# Patient Record
Sex: Male | Born: 1960 | Race: Black or African American | Hispanic: No | Smoking: Former smoker
Health system: Southern US, Community
[De-identification: ages and names within clinical notes are randomized; demographics above are authoritative.]

## PROBLEM LIST (undated history)

## (undated) DIAGNOSIS — E119 Type 2 diabetes mellitus without complications: Secondary | ICD-10-CM

## (undated) DIAGNOSIS — R972 Elevated prostate specific antigen [PSA]: Secondary | ICD-10-CM

## (undated) DIAGNOSIS — K7460 Unspecified cirrhosis of liver: Secondary | ICD-10-CM

## (undated) DIAGNOSIS — F1010 Alcohol abuse, uncomplicated: Secondary | ICD-10-CM

## (undated) HISTORY — PX: OTHER SURGICAL HISTORY: SHX169

## (undated) HISTORY — PX: MOUTH SURGERY: SHX715

## (undated) HISTORY — DX: Type 2 diabetes mellitus without complications: E11.9

## (undated) HISTORY — PX: TONSILLECTOMY: SUR1361

## (undated) HISTORY — PX: EYE SURGERY: SHX253

---

## 1998-08-21 ENCOUNTER — Encounter: Payer: Self-pay | Admitting: Emergency Medicine

## 1998-08-21 ENCOUNTER — Emergency Department (HOSPITAL_COMMUNITY): Admission: EM | Admit: 1998-08-21 | Discharge: 1998-08-21 | Payer: Self-pay | Admitting: Emergency Medicine

## 1998-09-14 ENCOUNTER — Emergency Department (HOSPITAL_COMMUNITY): Admission: EM | Admit: 1998-09-14 | Discharge: 1998-09-14 | Payer: Self-pay | Admitting: Emergency Medicine

## 2001-08-29 ENCOUNTER — Emergency Department (HOSPITAL_COMMUNITY): Admission: EM | Admit: 2001-08-29 | Discharge: 2001-08-29 | Payer: Self-pay | Admitting: Emergency Medicine

## 2001-08-29 ENCOUNTER — Encounter: Payer: Self-pay | Admitting: Emergency Medicine

## 2005-02-03 ENCOUNTER — Emergency Department (HOSPITAL_COMMUNITY): Admission: EM | Admit: 2005-02-03 | Discharge: 2005-02-03 | Payer: Self-pay | Admitting: Emergency Medicine

## 2005-04-06 ENCOUNTER — Emergency Department (HOSPITAL_COMMUNITY): Admission: EM | Admit: 2005-04-06 | Discharge: 2005-04-06 | Payer: Self-pay | Admitting: Emergency Medicine

## 2005-12-08 ENCOUNTER — Emergency Department (HOSPITAL_COMMUNITY): Admission: EM | Admit: 2005-12-08 | Discharge: 2005-12-08 | Payer: Self-pay | Admitting: Emergency Medicine

## 2006-01-09 ENCOUNTER — Ambulatory Visit: Payer: Self-pay | Admitting: Gastroenterology

## 2006-01-10 ENCOUNTER — Ambulatory Visit: Payer: Self-pay | Admitting: Gastroenterology

## 2006-01-10 ENCOUNTER — Other Ambulatory Visit: Admission: RE | Admit: 2006-01-10 | Discharge: 2006-01-10 | Payer: Self-pay | Admitting: Gastroenterology

## 2006-01-16 ENCOUNTER — Ambulatory Visit: Payer: Self-pay | Admitting: Internal Medicine

## 2006-01-30 ENCOUNTER — Ambulatory Visit: Payer: Self-pay | Admitting: Gastroenterology

## 2006-05-02 ENCOUNTER — Ambulatory Visit: Payer: Self-pay | Admitting: Gastroenterology

## 2006-05-15 ENCOUNTER — Emergency Department (HOSPITAL_COMMUNITY): Admission: EM | Admit: 2006-05-15 | Discharge: 2006-05-15 | Payer: Self-pay | Admitting: Emergency Medicine

## 2006-05-26 ENCOUNTER — Ambulatory Visit: Payer: Self-pay | Admitting: Gastroenterology

## 2012-08-21 ENCOUNTER — Encounter (HOSPITAL_COMMUNITY): Payer: Self-pay | Admitting: *Deleted

## 2012-08-21 ENCOUNTER — Encounter (HOSPITAL_COMMUNITY): Payer: Self-pay | Admitting: Emergency Medicine

## 2012-08-21 ENCOUNTER — Emergency Department (HOSPITAL_COMMUNITY): Payer: Medicaid Other

## 2012-08-21 ENCOUNTER — Emergency Department (INDEPENDENT_AMBULATORY_CARE_PROVIDER_SITE_OTHER)
Admission: EM | Admit: 2012-08-21 | Discharge: 2012-08-21 | Disposition: A | Discharge status: Nursing Home (Includes ICF) | Payer: Self-pay | Source: Home / Self Care | Attending: Family Medicine | Admitting: Family Medicine

## 2012-08-21 ENCOUNTER — Inpatient Hospital Stay (HOSPITAL_COMMUNITY)
Admission: EM | Admit: 2012-08-21 | Discharge: 2012-08-24 | DRG: 441 | Disposition: A | Discharge status: Home / Self Care | Payer: Medicaid Other | Attending: Internal Medicine | Admitting: Internal Medicine

## 2012-08-21 DIAGNOSIS — D649 Anemia, unspecified: Secondary | ICD-10-CM

## 2012-08-21 DIAGNOSIS — Z79899 Other long term (current) drug therapy: Secondary | ICD-10-CM

## 2012-08-21 DIAGNOSIS — N289 Disorder of kidney and ureter, unspecified: Secondary | ICD-10-CM

## 2012-08-21 DIAGNOSIS — K089 Disorder of teeth and supporting structures, unspecified: Secondary | ICD-10-CM | POA: Diagnosis present

## 2012-08-21 DIAGNOSIS — N183 Chronic kidney disease, stage 3 unspecified: Secondary | ICD-10-CM

## 2012-08-21 DIAGNOSIS — Z23 Encounter for immunization: Secondary | ICD-10-CM

## 2012-08-21 DIAGNOSIS — D696 Thrombocytopenia, unspecified: Secondary | ICD-10-CM | POA: Diagnosis present

## 2012-08-21 DIAGNOSIS — D684 Acquired coagulation factor deficiency: Secondary | ICD-10-CM | POA: Diagnosis present

## 2012-08-21 DIAGNOSIS — N390 Urinary tract infection, site not specified: Secondary | ICD-10-CM | POA: Diagnosis present

## 2012-08-21 DIAGNOSIS — R209 Unspecified disturbances of skin sensation: Secondary | ICD-10-CM | POA: Diagnosis present

## 2012-08-21 DIAGNOSIS — N179 Acute kidney failure, unspecified: Secondary | ICD-10-CM | POA: Diagnosis present

## 2012-08-21 DIAGNOSIS — K7200 Acute and subacute hepatic failure without coma: Principal | ICD-10-CM | POA: Diagnosis present

## 2012-08-21 DIAGNOSIS — R188 Other ascites: Secondary | ICD-10-CM | POA: Diagnosis present

## 2012-08-21 DIAGNOSIS — K7030 Alcoholic cirrhosis of liver without ascites: Secondary | ICD-10-CM | POA: Diagnosis present

## 2012-08-21 DIAGNOSIS — K7031 Alcoholic cirrhosis of liver with ascites: Secondary | ICD-10-CM

## 2012-08-21 DIAGNOSIS — K7682 Hepatic encephalopathy: Secondary | ICD-10-CM | POA: Diagnosis present

## 2012-08-21 DIAGNOSIS — F17200 Nicotine dependence, unspecified, uncomplicated: Secondary | ICD-10-CM | POA: Diagnosis present

## 2012-08-21 DIAGNOSIS — G5790 Unspecified mononeuropathy of unspecified lower limb: Secondary | ICD-10-CM | POA: Diagnosis present

## 2012-08-21 DIAGNOSIS — E46 Unspecified protein-calorie malnutrition: Secondary | ICD-10-CM | POA: Diagnosis present

## 2012-08-21 DIAGNOSIS — K7290 Hepatic failure, unspecified without coma: Secondary | ICD-10-CM | POA: Diagnosis present

## 2012-08-21 DIAGNOSIS — D689 Coagulation defect, unspecified: Secondary | ICD-10-CM | POA: Diagnosis present

## 2012-08-21 DIAGNOSIS — F1020 Alcohol dependence, uncomplicated: Secondary | ICD-10-CM | POA: Diagnosis present

## 2012-08-21 DIAGNOSIS — K7689 Other specified diseases of liver: Secondary | ICD-10-CM

## 2012-08-21 HISTORY — DX: Alcohol abuse, uncomplicated: F10.10

## 2012-08-21 LAB — COMPREHENSIVE METABOLIC PANEL
ALT: 24 U/L (ref 0–53)
Albumin: 1.9 g/dL — ABNORMAL LOW (ref 3.5–5.2)
Alkaline Phosphatase: 393 U/L — ABNORMAL HIGH (ref 39–117)
BUN: 11 mg/dL (ref 6–23)
Chloride: 99 mEq/L (ref 96–112)
GFR calc Af Amer: 21 mL/min — ABNORMAL LOW (ref >90)
Glucose, Bld: 142 mg/dL — ABNORMAL HIGH (ref 70–99)
Potassium: 3.6 mEq/L (ref 3.5–5.1)
Sodium: 135 mEq/L (ref 135–145)
Total Bilirubin: 3.9 mg/dL — ABNORMAL HIGH (ref 0.3–1.2)

## 2012-08-21 LAB — POCT I-STAT, CHEM 8
HCT: 32 % — ABNORMAL LOW (ref 39.0–52.0)
Hemoglobin: 10.9 g/dL — ABNORMAL LOW (ref 13.0–17.0)
Potassium: 4.2 mEq/L (ref 3.5–5.1)
Sodium: 140 mEq/L (ref 135–145)

## 2012-08-21 LAB — POCT I-STAT 3, ART BLOOD GAS (G3+)
Acid-base deficit: 3 mmol/L — ABNORMAL HIGH (ref 0.0–2.0)
Bicarbonate: 20.3 mEq/L (ref 20.0–24.0)
O2 Saturation: 90 %
TCO2: 21 mmol/L (ref 0–100)
pCO2 arterial: 29.5 mmHg — ABNORMAL LOW (ref 35.0–45.0)
pO2, Arterial: 56 mmHg — ABNORMAL LOW (ref 80.0–100.0)

## 2012-08-21 LAB — URINE MICROSCOPIC-ADD ON

## 2012-08-21 LAB — URINALYSIS, ROUTINE W REFLEX MICROSCOPIC
Glucose, UA: 100 mg/dL — AB
Ketones, ur: 15 mg/dL — AB
Nitrite: POSITIVE — AB
Specific Gravity, Urine: 1.023 (ref 1.005–1.030)
pH: 5.5 (ref 5.0–8.0)

## 2012-08-21 LAB — CBC WITH DIFFERENTIAL/PLATELET
Hemoglobin: 10.5 g/dL — ABNORMAL LOW (ref 13.0–17.0)
Lymphs Abs: 2 10*3/uL (ref 0.7–4.0)
Monocytes Relative: 9 % (ref 3–12)
Neutro Abs: 9.1 10*3/uL — ABNORMAL HIGH (ref 1.7–7.7)
Neutrophils Relative %: 75 % (ref 43–77)
Platelets: 147 10*3/uL — ABNORMAL LOW (ref 150–400)
RBC: 3.08 MIL/uL — ABNORMAL LOW (ref 4.22–5.81)
WBC: 12.1 10*3/uL — ABNORMAL HIGH (ref 4.0–10.5)

## 2012-08-21 LAB — LIPASE, BLOOD: Lipase: 7 U/L — ABNORMAL LOW (ref 11–59)

## 2012-08-21 LAB — ACETAMINOPHEN LEVEL: Acetaminophen (Tylenol), Serum: <15 ug/mL (ref 10–30)

## 2012-08-21 MED ORDER — SODIUM CHLORIDE 0.9 % IV BOLUS (SEPSIS)
1000.0000 mL | Freq: Once | INTRAVENOUS | Status: AC
  Administered 2012-08-21: 1000 mL via INTRAVENOUS

## 2012-08-21 MED ORDER — VITAMIN K1 10 MG/ML IJ SOLN
10.0000 mg | Freq: Once | INTRAVENOUS | Status: AC
  Administered 2012-08-21: 10 mg via INTRAVENOUS
  Filled 2012-08-21 (×2): qty 1

## 2012-08-21 MED ORDER — INFLUENZA VIRUS VACC SPLIT PF IM SUSP
0.5000 mL | INTRAMUSCULAR | Status: AC
  Administered 2012-08-22: 0.5 mL via INTRAMUSCULAR
  Filled 2012-08-21: qty 0.5

## 2012-08-21 MED ORDER — HYPROMELLOSE (GONIOSCOPIC) 2.5 % OP SOLN: 2.0000 [drp] | Freq: Four times a day (QID) | OPHTHALMIC | Status: DC | PRN

## 2012-08-21 MED ORDER — PNEUMOCOCCAL VAC POLYVALENT 25 MCG/0.5ML IJ INJ
0.5000 mL | INJECTION | INTRAMUSCULAR | Status: AC
  Administered 2012-08-22: 0.5 mL via INTRAMUSCULAR
  Filled 2012-08-21: qty 0.5

## 2012-08-21 MED ORDER — SODIUM CHLORIDE 0.9 % IV SOLN: INTRAVENOUS | Status: DC

## 2012-08-21 MED ORDER — THIAMINE HCL 100 MG/ML IJ SOLN
Freq: Once | INTRAVENOUS | Status: AC
  Administered 2012-08-22: via INTRAVENOUS
  Filled 2012-08-21: qty 1000

## 2012-08-21 NOTE — ED Provider Notes (Signed)
History     CSN: [id number]  Arrival date & time 08/21/12  1057   First MD Initiated Contact with Patient 08/21/12 1715      Chief Complaint  Patient presents with  . Leg Swelling  . Bloated    (Consider location/radiation/quality/duration/timing/severity/associated sxs/prior treatment) HPI  51 y.o. male INAD c/o intermittent bilateral foot parasthesia x2 weeks, Pt also reports increasing abdominal girth x2 days, Urine is darker, denies SOB, last ETOH  x1 month ago, denies h/o heavy use. Weight loss secondary to reduced PO intake fropm tooth pain. Denies fever, chest pain, palpitations, N/V, diarrhea, h/o hepatitis .   History reviewed. No pertinent past medical history.  History reviewed. No pertinent past surgical history.  No family history on file.  History  Substance Use Topics  . Smoking status: Current Every Day Smoker -- 0.5 packs/day    Types: Cigarettes  . Smokeless tobacco: Not on file  . Alcohol Use: Yes     Quit one month ago---never drank heaviy      Review of Systems  Allergies  Review of patient's allergies indicates no known allergies.  Home Medications   Current Outpatient Rx  Name Route Sig Dispense Refill  . HYPROMELLOSE 2.5 % OP SOLN Both Eyes Place 2 drops into both eyes 4 (four) times daily as needed. For dry eyes    . IBUPROFEN 200 MG PO TABS Oral Take 400 mg by mouth every 6 (six) hours as needed. For pain      BP 101/74  Pulse 95  Temp 98.3 F (36.8 C) (Oral)  Resp 14  SpO2 93%  Physical Exam  Nursing note and vitals reviewed. Constitutional: He is oriented to person, place, and time.       Frail and thin.  HENT:  Head: Normocephalic.  Mouth/Throat: Oropharynx is clear and moist.  Eyes: Conjunctivae normal and EOM are normal. Pupils are equal, round, and reactive to light.       Scleral icterus  Neck: Normal range of motion. No JVD present.  Cardiovascular: Normal rate, regular rhythm and intact distal pulses.     Pulmonary/Chest: Effort normal and breath sounds normal. No stridor.  Abdominal: Soft. He exhibits distension. He exhibits no mass. There is no tenderness. There is no rebound and no guarding.       Moderate ascites with no tenseness. Positive fluid wave. Livers edge is nonpalpable  Musculoskeletal: Normal range of motion.       No lower extremity edema.  Neurological: He is alert and oriented to person, place, and time.  Psychiatric: He has a normal mood and affect.    ED Course  Procedures (including critical care time)  Labs Reviewed  CBC WITH DIFFERENTIAL - Abnormal; Notable for the following:    WBC 12.1 (*)     RBC 3.08 (*)     Hemoglobin 10.5 (*)     HCT 29.6 (*)     MCH 34.1 (*)     Platelets 147 (*)     Neutro Abs 9.1 (*)     All other components within normal limits  COMPREHENSIVE METABOLIC PANEL - Abnormal; Notable for the following:    CO2 18 (*)     Glucose, Bld 142 (*)     Creatinine, Ser 3.63 (*)     Calcium 8.3 (*)     Total Protein 8.5 (*)     Albumin 1.9 (*)     AST 86 (*)     Alkaline Phosphatase 393 (*)  Total Bilirubin 3.9 (*)     GFR calc non Af Amer 18 (*)     GFR calc Af Amer 21 (*)     All other components within normal limits  LIPASE, BLOOD - Abnormal; Notable for the following:    Lipase 7 (*)     All other components within normal limits  PROTIME-INR - Abnormal; Notable for the following:    Prothrombin Time 18.8 (*)     INR 1.63 (*)     All other components within normal limits  APTT - Abnormal; Notable for the following:    aPTT 42 (*)     All other components within normal limits  PRO B NATRIURETIC PEPTIDE  URINALYSIS, ROUTINE W REFLEX MICROSCOPIC   Dg Abd Acute W/chest  08/21/2012  *RADIOLOGY REPORT*  Clinical Data: Tightness in abdomen.  Swelling in legs.  History of constipation.  ACUTE ABDOMEN SERIES (ABDOMEN 2 VIEW & CHEST 1 VIEW)  Comparison: 05/15/2006  Findings: Frontal view of the chest demonstrates midline trachea. Normal  heart size and mediastinal contours.  Mild left hemidiaphragm elevation. No pleural effusion or pneumothorax. Diminished lung volumes with mild bibasilar atelectasis.  EKG lead artifact projects over the left upper lobe.  Abominal films demonstrate no free intraperitoneal air on upright positioning.  No significant air fluid levels.  Small bowel loops which measure up to 3.4 cm in the central abdomen.  Mildly dilated.  Paucity of colonic gas.  Minimal distal/rectal gas.  Vascular calcifications.  IMPRESSION:  1.  Nonspecific mildly dilated small bowel loops within the central abdomen.  No significant air fluid levels or evidence of free intraperitoneal air.  Considerations include low grade partial small bowel obstruction or mild adynamic ileus.  Given the centralized bowel loops, ascites would be an alternate concern. Plain film follow up or CT should be considered. 2. No acute cardiopulmonary disease.   Original Report Authenticated By: Consuello Bossier, M.D.      1. Hepatic insufficiency   2. Renal insufficiency       MDM  Liver failure with increased INR, Jaundice, ascites, and high AST (86), alk phos (393). Tachycardic to the 130s and normotensive. I will give the patient vitamin K to see if his INR normalizes. He will be given fluid bolus and attempt to bring his pulse rate down. Further liver function tests and abdominal ultrasound will be ordered.   Creatinine in 3.36 and BUN is normal. ?Hepatorenal syndrome  Ammonia is elevated at 94 and GGT is 419. Patient does not seem to have a truly altered mental status we will defer lactulose administration at this time.  Patient will be admitted to MedSurg floor under care of [facility].        Wynetta Emery, PA-C 08/21/12 2110

## 2012-08-21 NOTE — ED Provider Notes (Signed)
History     CSN: [id number]  Arrival date & time 08/21/12  5621   First MD Initiated Contact with Patient 08/21/12 [id number]      Chief Complaint  Patient presents with  . Foot Pain    (Consider location/radiation/quality/duration/timing/severity/associated sxs/prior treatment) Patient is a 51 y.o. male presenting with neurologic complaint. The history is provided by the patient.  Neurologic Problem The primary symptoms include loss of sensation. The symptoms began more than 1 week ago. The symptoms are unchanged. The neurological symptoms are diffuse.  Additional symptoms do not include weakness or lower back pain. Medical issues also include alcohol use.    History reviewed. No pertinent past medical history.  History reviewed. No pertinent past surgical history.  No family history on file.  History  Substance Use Topics  . Smoking status: Current Every Day Smoker -- 0.5 packs/day    Types: Cigarettes  . Smokeless tobacco: Not on file  . Alcohol Use: Yes     Quit one month ago---never drank heaviy      Review of Systems  Constitutional: Negative.   Gastrointestinal: Negative.   Genitourinary: Negative.   Musculoskeletal: Negative.   Neurological: Positive for numbness. Negative for weakness.    Allergies  Review of patient's allergies indicates no known allergies.  Home Medications   Current Outpatient Rx  Name Route Sig Dispense Refill  . HYPROMELLOSE 2.5 % OP SOLN Both Eyes Place 2 drops into both eyes 4 (four) times daily as needed. For dry eyes    . IBUPROFEN 200 MG PO TABS Oral Take 400 mg by mouth every 6 (six) hours as needed. For pain      BP 117/88  Pulse 129  Temp 98.1 F (36.7 C) (Oral)  Resp 20  SpO2 100%  Physical Exam  Nursing note and vitals reviewed. Constitutional: He is oriented to person, place, and time. He appears well-developed and well-nourished.  Musculoskeletal: He exhibits no edema and no tenderness.  Neurological: He is alert  and oriented to person, place, and time. He has normal strength. A sensory deficit is present.       Decreased foot sensation bilat, pulses nl.  Skin: Skin is warm and dry.       Stasis dermatitis bilat feet, dry, no pain.    ED Course  Procedures (including critical care time)  Labs Reviewed  POCT I-STAT, CHEM 8 - Abnormal; Notable for the following:    Creatinine, Ser 3.70 (*)     Glucose, Bld 134 (*)     Calcium, Ion 0.95 (*)     Hemoglobin 10.9 (*)     HCT 32.0 (*)     All other components within normal limits   No results found.   1. Chronic kidney disease (CKD), stage III (moderate)   2. Anemia       MDM  Creat 3.7, hgb 10.9 glu 134, ca 0.95        Linna Hoff, MD 08/21/12 1253

## 2012-08-21 NOTE — ED Notes (Signed)
Pt c/o bilateral foot swelling x2 weeks... Sx include: rash on both legs, numbness, tingly sensation... He denies: fevers, nausea, vomiting, diarrhea, inj/trauma to feet.... Says it's constant even when resting.

## 2012-08-21 NOTE — Progress Notes (Signed)
51 year old man with abdominal distention of a week's duration.  He denies excess alcohol, hepatitis.  Exam shows his abdomen distended with ascites.  LFT's were abnormal with alk phos 393, albumin 1.9, Bilirubin 3.9, ammonia 94.  Clinical Dx:  Cirrhosis of the liver.  Call to unassigned medicine to admit him.

## 2012-08-21 NOTE — ED Notes (Signed)
PT is here from urgent care with swelling to LE, no shortness of breath with walking or laying down.  Pt is here with abdominal bloating for 2-3 days.  Pt reports passing gas.  .  NO pain

## 2012-08-21 NOTE — ED Notes (Signed)
Asked patient again to give urine sample.  Readjusted in the bed and gave a urinal, asked to use the call light to notify us when he is done.

## 2012-08-21 NOTE — H&P (Signed)
[facility] History and Physical  HENDRY SPEAS WUJ:[id number] DOB: 05/12/1961 DOA: 08/21/2012  Referring physician: ED, Dr. Ignacia Palma PCP: No primary provider on file.   Chief Complaint: Abdominal swelling  HPI: Patrick [name] is a 51 y.o. male who per his own reluctant admission and his wife's admission has not been taking very good care of himself it sounds like.  Specifically he has been using EtOH (consisting of wine) very heavily on a daily basis for many years.  Although he initially denies EtOH abuse in the ED, further history taking from him and his wife reveal that he drinks very heavily, every day, and his wife has been trying to get him to see a doctor for the past 6 years.  Unfortunately he has had a slowly progressive decline that culminated in abdominal distention (not pain) for the past 2 weeks, the patients wife was finally able to convince him to go to the ED.  On eval in the ED he has multiple laboratory abnormalities and a clinical picture that would appear to be cirrhosis of the liver possibly even ESLD, because there is no way as an out patient that he would get in to see a gastroenterologist the ED has asked me to admit him and I have agreed to admit the patient for the purpose of GI consultation regarding ESLD.  Review of Systems: The patient notes he has been through EtOH withdrawal associated with seizures in the past although not recently, denies fever, chills, nausea, vomiting, 12 systems reviewed and otherwise negative.  Past Medical History  Diagnosis Date  . ETOH abuse    History reviewed. No pertinent past surgical history. Social History:  reports that he has been smoking Cigarettes.  He has been smoking about .5 packs per day. He does not have any smokeless tobacco history on file. He reports that he drinks alcohol. He reports that he does not use illicit drugs.   No Known Allergies  No family history on file.  Prior to Admission medications     Medication Sig Start Date End Date Taking? Authorizing Provider  hydroxypropyl methylcellulose (ISOPTO TEARS) 2.5 % ophthalmic solution Place 2 drops into both eyes 4 (four) times daily as needed. For dry eyes   Yes Historical Provider, MD  ibuprofen (ADVIL,MOTRIN) 200 MG tablet Take 400 mg by mouth every 6 (six) hours as needed. For pain   Yes Historical Provider, MD   Physical Exam: Filed Vitals:   08/21/12 1930 08/21/12 1945 08/21/12 2015 08/21/12 2030  BP: 94/72 96/75 92/64  99/70  Pulse: 97 95 92 119  Temp:      TempSrc:      Resp: 18 18 16 21   SpO2: 94% 95% 90% 92%     General:  NAD, resting comfortably in hospital bed  Eyes: marked scleral icterus present, PEERLA EOMI  ENT: There is obvious and severe temporal muscle wasting present, moist mucous membranes  Neck: supple, there is obvious neck muscle wasting present  Cardiovascular: RRR w/o MRG  Respiratory: CTA B  Abdomen: patient has distended abdomen with obvious ascities  Skin: not really able to appreciate any skin jaundice on this patient (although the scleral are jaundiced as noted above)  Musculoskeletal: diffuse muscle wasting again noted  Neurologic: AAOx3, no evidence of withdrawals at this time, grossly non-focal  Labs on Admission:  Basic Metabolic Panel:  Lab 08/21/12 9562 08/21/12 0956  NA 135 140  K 3.6 4.2  CL 99 107  CO2 18* --  GLUCOSE  142* 134*  BUN 11 12  CREATININE 3.63* 3.70*  CALCIUM 8.3* --  MG -- --  PHOS -- --   Liver Function Tests:  Lab 08/21/12 1151  AST 86*  ALT 24  ALKPHOS 393*  BILITOT 3.9*  PROT 8.5*  ALBUMIN 1.9*    Lab 08/21/12 1151  LIPASE 7*  AMYLASE --    Lab 08/21/12 1808  AMMONIA 94*   CBC:  Lab 08/21/12 1151 08/21/12 0956  WBC 12.1* --  NEUTROABS 9.1* --  HGB 10.5* 10.9*  HCT 29.6* 32.0*  MCV 96.1 --  PLT 147* --   Cardiac Enzymes: No results found for this basename: CKTOTAL:5,CKMB:5,CKMBINDEX:5,TROPONINI:5 in the last 168 hours  BNP  (last 3 results)  Basename 08/21/12 1152  PROBNP 60.2   CBG: No results found for this basename: GLUCAP:5 in the last 168 hours  Radiological Exams on Admission: US Abdomen Complete  08/21/2012  *RADIOLOGY REPORT*  Clinical Data:  Leg swelling  COMPLETE ABDOMINAL ULTRASOUND  Comparison:  CT 01/16/2006  Findings:  Gallbladder:  The gallbladder is thick-walled likely related to ascites.  The gallbladder wall measures 3 mm.  There is pericholecystic fluid also likely relate to ascites.  No echogenic gallstones are present.  Gallbladder is not distended. Negative sonographic Murphy's sign.  Common bile duct:  Normal at 3 mm.  Liver:  Liver has a shrunken nodular contour and is echogenic.  No ductal  dilatation.  Portal veins are grossly patent.  IVC:  Appears normal.  Pancreas:  Poorly visualized  Spleen:  Normal size and echogenicity.  Right Kidney:  11.1cm in length.  No evidence of hydronephrosis or stones.  Left Kidney:  10.4cm in length.  No evidence of hydronephrosis or stones.  Abdominal aorta:  No aneurysm identified.  There is a large volume of intraperitoneal free fluid all four quadrants.  IMPRESSION:  1.  Large volume ascites in all four quadrants. 2.  Shrunken nodular liver consistent with cirrhosis. 3.  Gallbladder wall thickening is likely passive edema from ascites.   Original Report Authenticated By: Genevive Bi, M.D.    Dg Abd Acute W/chest  08/21/2012  *RADIOLOGY REPORT*  Clinical Data: Tightness in abdomen.  Swelling in legs.  History of constipation.  ACUTE ABDOMEN SERIES (ABDOMEN 2 VIEW & CHEST 1 VIEW)  Comparison: 05/15/2006  Findings: Frontal view of the chest demonstrates midline trachea. Normal heart size and mediastinal contours.  Mild left hemidiaphragm elevation. No pleural effusion or pneumothorax. Diminished lung volumes with mild bibasilar atelectasis.  EKG lead artifact projects over the left upper lobe.  Abominal films demonstrate no free intraperitoneal air on upright  positioning.  No significant air fluid levels.  Small bowel loops which measure up to 3.4 cm in the central abdomen.  Mildly dilated.  Paucity of colonic gas.  Minimal distal/rectal gas.  Vascular calcifications.  IMPRESSION:  1.  Nonspecific mildly dilated small bowel loops within the central abdomen.  No significant air fluid levels or evidence of free intraperitoneal air.  Considerations include low grade partial small bowel obstruction or mild adynamic ileus.  Given the centralized bowel loops, ascites would be an alternate concern. Plain film follow up or CT should be considered. 2. No acute cardiopulmonary disease.   Original Report Authenticated By: Consuello Bossier, M.D.     EKG: Independently reviewed.  Assessment/Plan Principal Problem:  *Alcoholic cirrhosis of liver with ascites   1. EtOH cirrhosis lab values concerning for ESLD- clinical diagnosis at this point, am admitting the patient  for the sole purpose of getting a GI Consult which I have called already, GI will see patient tomorrow, after reviewing labs over phone GI physician is concerned that patient may indeed have ESLD and possibly a type 2 hepato-renal syndrome, furthermore he is very concerned given that the patients meld score is 29 by my calculation and the patient may in fact even be out of the window for liver transplant even if he were to quit drinking.  Never the less admitting him for formal consultation and evaluation tomorrow, will go ahead and order hepatitis panel as well to look for chronic hepatitis. 2. EtOH use and abuse - monitor for signs and symptoms of withdrawals and treat if these arrise  Code Status: Full Code Family Communication: Spoke at length with wife who was in ED Disposition Plan: Admit to Obs  Time spent: 70 min  Locklyn Henriquez M. [facility] Pager [phone]  If 7PM-7AM, please contact night-coverage [email or web address] Password [facility] 08/21/2012, 8:49 PM

## 2012-08-22 ENCOUNTER — Encounter (HOSPITAL_COMMUNITY): Payer: Self-pay | Admitting: *Deleted

## 2012-08-22 DIAGNOSIS — R188 Other ascites: Secondary | ICD-10-CM | POA: Diagnosis present

## 2012-08-22 DIAGNOSIS — N289 Disorder of kidney and ureter, unspecified: Secondary | ICD-10-CM

## 2012-08-22 DIAGNOSIS — K7030 Alcoholic cirrhosis of liver without ascites: Secondary | ICD-10-CM

## 2012-08-22 LAB — HEPATITIS PANEL, ACUTE
HCV Ab: NEGATIVE
Hep A IgM: NEGATIVE
Hep A IgM: NEGATIVE
Hepatitis B Surface Ag: NEGATIVE

## 2012-08-22 LAB — COMPREHENSIVE METABOLIC PANEL
Albumin: 1.5 g/dL — ABNORMAL LOW (ref 3.5–5.2)
Alkaline Phosphatase: 311 U/L — ABNORMAL HIGH (ref 39–117)
BUN: 12 mg/dL (ref 6–23)
CO2: 21 mEq/L (ref 19–32)
Chloride: 105 mEq/L (ref 96–112)
Creatinine, Ser: 3.07 mg/dL — ABNORMAL HIGH (ref 0.50–1.35)
GFR calc Af Amer: 26 mL/min — ABNORMAL LOW (ref >90)
GFR calc non Af Amer: 22 mL/min — ABNORMAL LOW (ref >90)
Glucose, Bld: 78 mg/dL (ref 70–99)
Potassium: 3.4 mEq/L — ABNORMAL LOW (ref 3.5–5.1)
Total Bilirubin: 3 mg/dL — ABNORMAL HIGH (ref 0.3–1.2)

## 2012-08-22 LAB — PROTIME-INR
INR: 1.77 — ABNORMAL HIGH (ref 0.00–1.49)
Prothrombin Time: 20 seconds — ABNORMAL HIGH (ref 11.6–15.2)

## 2012-08-22 LAB — CBC
HCT: 26.7 % — ABNORMAL LOW (ref 39.0–52.0)
Hemoglobin: 9.2 g/dL — ABNORMAL LOW (ref 13.0–17.0)
MCV: 96.7 fL (ref 78.0–100.0)
RBC: 2.76 MIL/uL — ABNORMAL LOW (ref 4.22–5.81)
WBC: 8.9 10*3/uL (ref 4.0–10.5)

## 2012-08-22 LAB — RETICULOCYTES
RBC.: 2.8 MIL/uL — ABNORMAL LOW (ref 4.22–5.81)
Retic Ct Pct: 2 % (ref 0.4–3.1)

## 2012-08-22 MED ORDER — ADULT MULTIVITAMIN W/MINERALS CH
1.0000 | ORAL_TABLET | Freq: Every day | ORAL | Status: DC
  Administered 2012-08-22 – 2012-08-24 (×3): 1 via ORAL
  Filled 2012-08-22 (×3): qty 1

## 2012-08-22 MED ORDER — FOLIC ACID 1 MG PO TABS
1.0000 mg | ORAL_TABLET | Freq: Every day | ORAL | Status: DC
  Administered 2012-08-22 – 2012-08-24 (×3): 1 mg via ORAL
  Filled 2012-08-22 (×4): qty 1

## 2012-08-22 MED ORDER — VITAMIN B-1 100 MG PO TABS
100.0000 mg | ORAL_TABLET | Freq: Every day | ORAL | Status: DC
  Administered 2012-08-22 – 2012-08-24 (×3): 100 mg via ORAL
  Filled 2012-08-22 (×3): qty 1

## 2012-08-22 MED ORDER — LORAZEPAM 2 MG/ML IJ SOLN: 1.0000 mg | Freq: Four times a day (QID) | INTRAMUSCULAR | Status: DC | PRN

## 2012-08-22 MED ORDER — ENSURE COMPLETE PO LIQD
237.0000 mL | ORAL | Status: DC
  Administered 2012-08-22: 237 mL via ORAL

## 2012-08-22 MED ORDER — THIAMINE HCL 100 MG/ML IJ SOLN
100.0000 mg | Freq: Every day | INTRAMUSCULAR | Status: DC
  Filled 2012-08-22 (×3): qty 1

## 2012-08-22 MED ORDER — DEXTROSE 5 % IV SOLN
1.0000 g | INTRAVENOUS | Status: DC
  Administered 2012-08-22 – 2012-08-24 (×3): 1 g via INTRAVENOUS
  Filled 2012-08-22 (×3): qty 10

## 2012-08-22 MED ORDER — LORAZEPAM 1 MG PO TABS: 1.0000 mg | ORAL_TABLET | Freq: Four times a day (QID) | ORAL | Status: DC | PRN

## 2012-08-22 NOTE — ED Provider Notes (Signed)
Medical screening examination/treatment/procedure(s) were conducted as a shared visit with non-physician practitioner(s) and myself.  I personally evaluated the patient during the encounter 51 year old man with abdominal distention of a week's duration. He denies excess alcohol, hepatitis. Exam shows his abdomen distended with ascites. LFT's were abnormal with alk phos 393, albumin 1.9, Bilirubin 3.9, ammonia 94. Clinical Dx: Cirrhosis of the liver. Call to unassigned medicine to admit him.        Carleene Cooper III, MD 08/22/12 1321

## 2012-08-22 NOTE — Progress Notes (Signed)
[facility] PROGRESS NOTE  UNO ESAU ZOX:[id number] DOB: 1961/11/21 DOA: 08/21/2012 PCP: No primary provider on file.  Assessment/Plan: Principal Problem:  *Alcoholic cirrhosis of liver with ascites  Tight Ascites.   D/C IVF.    Patient not having difficulty breathing at this point.  Will await Dr. Kenna Gilbert recommendations regarding diuretics / paracentesis given patient's acute renal failure.  Cirrhosis with Acute liver failure.  Acute Hepatitis panel is pending, but this is almost certainly alcohol related. On 2 gram sodium diet, daily weights.  He has received a dose of Vitamin K IV.  We will manage symptoms and follow Dr. Kenna Gilbert recommendations regarding his liver disease.  Acute Renal Failure.  Creatinine improved slightly today.  Concerned for hepatorenal syndrome. Patient needs diuresis of ascites, but will defer to GI given his delicate condition.  Will check bmet in am.  UTI.  On rocephin.  Cultures sent.  History of ETOH withdraw seizures.  CIWA protocol in place.  ETOH abuse. On folic acid and thiamine.  Will request social work to counsel and offer outpatient vs inpatient rehab.  Code Status: full code Family Communication: communicated with patient at bedside Disposition Plan: to home when medically ready.  Brief narrative: Patrick [name] is a 51 y.o. male who per his own reluctant admission and his wife's admission has not been taking very good care of himself it sounds like. Specifically he has been using EtOH (consisting of wine) very heavily on a daily basis for many years. Although he initially denies EtOH abuse in the ED, further history taking from him and his wife reveal that he drinks very heavily, every day, and his wife has been trying to get him to see a doctor for the past 6 years.  Unfortunately he has had a slowly progressive decline that culminated in abdominal distention (not pain) for the past 2 weeks, the patients wife was finally able to convince  him to go to the ED.  Patient awaiting GI consultation.  He seems mildly confused as he believes he has already seen gastroenterology.  Consultants: Gastroenterology  Procedures:  Antibiotics: Rocephin    Objective: Filed Vitals:   08/21/12 2130 08/21/12 2145 08/21/12 2236 08/22/12 0600  BP: 109/89 94/64 102/69 100/66  Pulse: 93 88 111 94  Temp:   98.2 F (36.8 C) 98.8 F (37.1 C)  TempSrc:   Oral Oral  Resp: 19 21 15 20   Height:   5\' 11"  (1.803 m)   Weight:   67.1 kg (147 lb 14.9 oz)   SpO2: 94% 93% 93% 92%    Intake/Output Summary (Last 24 hours) at 08/22/12 1435 Last data filed at 08/22/12 0900  Gross per 24 hour  Intake    180 ml  Output      0 ml  Net    180 ml    Exam:   General:  Awake, NAD, mildly confused.  Cardiovascular: RRR, No M/R/G, no Lower Extremity Edema  Respiratory: CTA, No increased work of breathing  Abdomen:  Distended and tight.  + BS. Non Tender,  I'm unable to appreciate masses due to tight ascites.  Neurological:  Cranial Nerves 2 - 12 are grossly intact, exam non focal  Psychiatric:  A&O, Cooperative  Skin:  No rashes, bruises, or lesions  Data Reviewed: Basic Metabolic Panel:  Lab 08/22/12 8119 08/21/12 1151 08/21/12 0956  NA 137 135 140  K 3.4* 3.6 4.2  CL 105 99 107  CO2 21 18* --  GLUCOSE 78 142*  134*  BUN 12 11 12   CREATININE 3.07* 3.63* 3.70*  CALCIUM 7.4* 8.3* --  MG -- -- --  PHOS -- -- --   Liver Function Tests:  Lab 08/22/12 0605 08/21/12 1151  AST 65* 86*  ALT 18 24  ALKPHOS 311* 393*  BILITOT 3.0* 3.9*  PROT 6.9 8.5*  ALBUMIN 1.5* 1.9*    Lab 08/21/12 1151  LIPASE 7*  AMYLASE --    Lab 08/21/12 1808  AMMONIA 94*   CBC:  Lab 08/22/12 0605 08/21/12 1151 08/21/12 0956  WBC 8.9 12.1* --  NEUTROABS -- 9.1* --  HGB 9.2* 10.5* 10.9*  HCT 26.7* 29.6* 32.0*  MCV 96.7 96.1 --  PLT 110* 147* --    BNP (last 3 results)  Basename 08/21/12 1152  PROBNP 60.2   CBG:   Studies: US Abdomen  Complete  08/21/2012  *RADIOLOGY REPORT*  Clinical Data:  Leg swelling  COMPLETE ABDOMINAL ULTRASOUND  Comparison:  CT 01/16/2006  Findings:  Gallbladder:  The gallbladder is thick-walled likely related to ascites.  The gallbladder wall measures 3 mm.  There is pericholecystic fluid also likely relate to ascites.  No echogenic gallstones are present.  Gallbladder is not distended. Negative sonographic Murphy's sign.  Common bile duct:  Normal at 3 mm.  Liver:  Liver has a shrunken nodular contour and is echogenic.  No ductal  dilatation.  Portal veins are grossly patent.  IVC:  Appears normal.  Pancreas:  Poorly visualized  Spleen:  Normal size and echogenicity.  Right Kidney:  11.1cm in length.  No evidence of hydronephrosis or stones.  Left Kidney:  10.4cm in length.  No evidence of hydronephrosis or stones.  Abdominal aorta:  No aneurysm identified.  There is a large volume of intraperitoneal free fluid all four quadrants.  IMPRESSION:  1.  Large volume ascites in all four quadrants. 2.  Shrunken nodular liver consistent with cirrhosis. 3.  Gallbladder wall thickening is likely passive edema from ascites.   Original Report Authenticated By: Genevive Bi, M.D.    Dg Abd Acute W/chest  08/21/2012  *RADIOLOGY REPORT*  Clinical Data: Tightness in abdomen.  Swelling in legs.  History of constipation.  ACUTE ABDOMEN SERIES (ABDOMEN 2 VIEW & CHEST 1 VIEW)  Comparison: 05/15/2006  Findings: Frontal view of the chest demonstrates midline trachea. Normal heart size and mediastinal contours.  Mild left hemidiaphragm elevation. No pleural effusion or pneumothorax. Diminished lung volumes with mild bibasilar atelectasis.  EKG lead artifact projects over the left upper lobe.  Abominal films demonstrate no free intraperitoneal air on upright positioning.  No significant air fluid levels.  Small bowel loops which measure up to 3.4 cm in the central abdomen.  Mildly dilated.  Paucity of colonic gas.  Minimal distal/rectal  gas.  Vascular calcifications.  IMPRESSION:  1.  Nonspecific mildly dilated small bowel loops within the central abdomen.  No significant air fluid levels or evidence of free intraperitoneal air.  Considerations include low grade partial small bowel obstruction or mild adynamic ileus.  Given the centralized bowel loops, ascites would be an alternate concern. Plain film follow up or CT should be considered. 2. No acute cardiopulmonary disease.   Original Report Authenticated By: Consuello Bossier, M.D.     Scheduled Meds:   . sodium chloride   Intravenous STAT  . cefTRIAXone (ROCEPHIN)  IV  1 g Intravenous Q24H  . feeding supplement  237 mL Oral Q24H  . folic acid  1 mg Oral Daily  .  influenza  inactive virus vaccine  0.5 mL Intramuscular Tomorrow-1000  . multivitamin with minerals  1 tablet Oral Daily  . phytonadione (VITAMIN K) IV  10 mg Intravenous Once  . pneumococcal 23 valent vaccine  0.5 mL Intramuscular Tomorrow-1000  . general admission iv infusion   Intravenous Once  . sodium chloride  1,000 mL Intravenous Once  . thiamine  100 mg Oral Daily   Or  . thiamine  100 mg Intravenous Daily   Continuous Infusions:    Stephani Police [facility] Pager [phone]  If 7PM-7AM, please contact night-coverage [email or web address] Password [email or web address] 08/22/2012, 2:35 PM   LOS: 1 day

## 2012-08-22 NOTE — Progress Notes (Signed)
INITIAL ADULT NUTRITION ASSESSMENT Date: 08/22/2012   Time: 12:01 PM Reason for Assessment: MST (Malnutrition Screening Tool)   INTERVENTION: 1. Ensure Complete po daily, each supplement provides 350 kcal and 13 grams of protein. 2. Continue Thiamin/Folate, will add multivitamin as well 3. RD will continue to follow    DOCUMENTATION CODES Per approved criteria  -Severe malnutrition in the context of chronic illness    ASSESSMENT: Male 51 y.o.  Dx: Alcoholic cirrhosis of liver with ascites  Hx:  Past Medical History  Diagnosis Date  . ETOH abuse   . Headache    History reviewed. No pertinent past surgical history.   Related Meds:     . sodium chloride   Intravenous STAT  . cefTRIAXone (ROCEPHIN)  IV  1 g Intravenous Q24H  . folic acid  1 mg Oral Daily  . influenza  inactive virus vaccine  0.5 mL Intramuscular Tomorrow-1000  . multivitamin with minerals  1 tablet Oral Daily  . phytonadione (VITAMIN K) IV  10 mg Intravenous Once  . pneumococcal 23 valent vaccine  0.5 mL Intramuscular Tomorrow-1000  . general admission iv infusion   Intravenous Once  . sodium chloride  1,000 mL Intravenous Once  . thiamine  100 mg Oral Daily   Or  . thiamine  100 mg Intravenous Daily     Ht: 5\' 11"  (180.3 cm)  Wt: 147 lb 14.9 oz (67.1 kg)  Ideal Wt: 78.2 kg  % Ideal Wt:86%   Usual Wt: 167 lbs per pt report Wt Readings from Last 3 Encounters:  08/21/12 147 lb 14.9 oz (67.1 kg)   % Usual Wt: 88%  Body mass index is 20.63 kg/(m^2).  Food/Nutrition Related Hx: pt reports early satiety and weight loss  Labs:  CMP     Component Value Date/Time   NA 137 08/22/2012 0605   K 3.4* 08/22/2012 0605   CL 105 08/22/2012 0605   CO2 21 08/22/2012 0605   GLUCOSE 78 08/22/2012 0605   BUN 12 08/22/2012 0605   CREATININE 3.07* 08/22/2012 0605   CALCIUM 7.4* 08/22/2012 0605   PROT 6.9 08/22/2012 0605   ALBUMIN 1.5* 08/22/2012 0605   AST 65* 08/22/2012 0605   ALT 18 08/22/2012 0605   ALKPHOS  311* 08/22/2012 0605   BILITOT 3.0* 08/22/2012 0605   GFRNONAA 22* 08/22/2012 0605   GFRAA 26* 08/22/2012 0605    Intake/Output Summary (Last 24 hours) at 08/22/12 1204 Last data filed at 08/22/12 0900  Gross per 24 hour  Intake    180 ml  Output      0 ml  Net    180 ml     Diet Order: Cardiac  Supplements/Tube Feeding: none   IVF:    Estimated Nutritional Needs:   Kcal: 1850-2000 Protein: 70-85 gm  Fluid:  1.9-2 L   Pt admitted with abdominal swelling. Pt with hx of alcohol abuse, drinks large amounts of wine daily per notes. 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   Pt reports early satiety, likely related to abdominal swelling. Pt likely with additional weight loss masked by fluids in abdomin. Pt has visible wasting noted at the temples, with very thin extremities. Reports normal or near normal intake. Likes Ensure, willing to drink if unable to eat a full meal.   Pt meets criteria for severe malnutrition in the context of chronic illness 2/2 weight loss of 12% in less then 6 months, fluid accumulation, and wasting noted at the temples.    NUTRITION DIAGNOSIS: -  Malnutrition (NI-5.2).  Status: Ongoing  RELATED TO: early satiety, alcoholic cirrhosis  AS EVIDENCE BY: weight loss of 12% in less then 6 months, fluid accumulation, and wasting at the temples.   MONITORING/EVALUATION(Goals): Goal: PO intake of meals and supplements to meet >90% estimated nutrition needs  Monitor: PO intake, weight, labs, I/O's  EDUCATION NEEDS: -No education needs identified at this time    Clarene Duke RD, LDN Pager [phone] After Hours pager [phone]  08/22/2012, 12:01 PM

## 2012-08-22 NOTE — Progress Notes (Signed)
Pt's urine output is dark tea-colored this afternoon. Sample sent to lab for culture.

## 2012-08-22 NOTE — Progress Notes (Signed)
Addendum  Patient seen and examined, chart and data base reviewed.  I agree with the above assessment and plan  For full details please see Mrs. Algis Downs PA. Note.  Await GI recs, Check CMP in AM, Check HCV workup.  Clint Lipps Pager: [phone] 08/22/2012, 3:04 PM

## 2012-08-22 NOTE — Consult Note (Signed)
Reason for Consult: Ascites/alcoholic cirrhosis. Referring Physician: Dr. Arthor Captain.  Patrick [name] is an 51 y.o. male.  HPI: 51 year old black male with a longstanding history of alcoholism, presented to the ER with increasing abdominal distention. Patient denies having any nausea, vomiting, melena, hematochezia, abdominal pain, fever, chills or rigors. He reportedly has a good appetite and his weight has been stable. He denies any drug use. He claims he drinks 2-3 alcoholic beverages per day. He has a history of seizures due to alcohol withdrawal in the past. He is not under the care of a Hepatologist/Gastroenterologist for his present medical issues. He has had some tingling in his feet ?alcoholic neuropathy.  Past Medical History  Diagnosis Date  . ETOH abuse    History reviewed. No pertinent past surgical history.  History reviewed. No pertinent family history.  Social History:  reports that he has been smoking cigarettes.  He has been smoking about a half pack per day. He does not have any smokeless tobacco history on file. He reports that he drinks about 4.8 ounces of alcohol per week. He reports that he does not use illicit drugs.  Allergies: No Known Allergies  Medications: I have reviewed the medication list.  Results for orders placed during the hospital encounter of 08/21/12 (from the past 48 hour(s))  CBC WITH DIFFERENTIAL     Status: Abnormal   Collection Time   08/21/12 11:51 AM      Component Value Range Comment   WBC 12.1 (*) 4.0 - 10.5 K/uL    RBC 3.08 (*) 4.22 - 5.81 MIL/uL    Hemoglobin 10.5 (*) 13.0 - 17.0 g/dL    HCT 16.1 (*) 09.6 - 52.0 %    MCV 96.1  78.0 - 100.0 fL    MCH 34.1 (*) 26.0 - 34.0 pg    MCHC 35.5  30.0 - 36.0 g/dL    RDW 04.5  40.9 - 81.1 %    Platelets 147 (*) 150 - 400 K/uL    Neutrophils Relative 75  43 - 77 %    Neutro Abs 9.1 (*) 1.7 - 7.7 K/uL    Lymphocytes Relative 16  12 - 46 %    Lymphs Abs 2.0  0.7 - 4.0 K/uL    Monocytes Relative 9   3 - 12 %    Monocytes Absolute 1.0  0.1 - 1.0 K/uL    Eosinophils Relative 0  0 - 5 %    Eosinophils Absolute 0.0  0.0 - 0.7 K/uL    Basophils Relative 0  0 - 1 %    Basophils Absolute 0.0  0.0 - 0.1 K/uL   COMPREHENSIVE METABOLIC PANEL     Status: Abnormal   Collection Time   08/21/12 11:51 AM      Component Value Range Comment   Sodium 135  135 - 145 mEq/L    Potassium 3.6  3.5 - 5.1 mEq/L    Chloride 99  96 - 112 mEq/L    CO2 18 (*) 19 - 32 mEq/L    Glucose, Bld 142 (*) 70 - 99 mg/dL    BUN 11  6 - 23 mg/dL    Creatinine, Ser 9.14 (*) 0.50 - 1.35 mg/dL    Calcium 8.3 (*) 8.4 - 10.5 mg/dL    Total Protein 8.5 (*) 6.0 - 8.3 g/dL    Albumin 1.9 (*) 3.5 - 5.2 g/dL    AST 86 (*) 0 - 37 U/L    ALT 24  0 - 53 U/L    Alkaline Phosphatase 393 (*) 39 - 117 U/L    Total Bilirubin 3.9 (*) 0.3 - 1.2 mg/dL    GFR calc non Af Amer 18 (*) >90 mL/min    GFR calc Af Amer 21 (*) >90 mL/min   LIPASE, BLOOD     Status: Abnormal   Collection Time   08/21/12 11:51 AM      Component Value Range Comment   Lipase 7 (*) 11 - 59 U/L   PROTIME-INR     Status: Abnormal   Collection Time   08/21/12 11:51 AM      Component Value Range Comment   Prothrombin Time 18.8 (*) 11.6 - 15.2 seconds    INR 1.63 (*) 0.00 - 1.49   APTT     Status: Abnormal   Collection Time   08/21/12 11:51 AM      Component Value Range Comment   aPTT 42 (*) 24 - 37 seconds   PRO B NATRIURETIC PEPTIDE     Status: Normal   Collection Time   08/21/12 11:52 AM      Component Value Range Comment   Pro B Natriuretic peptide (BNP) 60.2  0 - 125 pg/mL   HEPATITIS PANEL, ACUTE     Status: Normal   Collection Time   08/21/12  5:49 PM      Component Value Range Comment   Hepatitis B Surface Ag NEGATIVE  NEGATIVE    HCV Ab NEGATIVE  NEGATIVE    Hep A IgM NEGATIVE  NEGATIVE    Hep B C IgM NEGATIVE  NEGATIVE   GAMMA GT     Status: Abnormal   Collection Time   08/21/12  5:49 PM      Component Value Range Comment   GGT 419 (*) 7 - 51  U/L   ACETAMINOPHEN LEVEL     Status: Normal   Collection Time   08/21/12  5:50 PM      Component Value Range Comment   Acetaminophen (Tylenol), Serum <15.0  10 - 30 ug/mL   AMMONIA     Status: Abnormal   Collection Time   08/21/12  6:08 PM      Component Value Range Comment   Ammonia 94 (*) 11 - 60 umol/L   POCT I-STAT 3, BLOOD GAS (G3+)     Status: Abnormal   Collection Time   08/21/12  6:25 PM      Component Value Range Comment   pH, Arterial 7.444  7.350 - 7.450    pCO2 arterial 29.5 (*) 35.0 - 45.0 mmHg    pO2, Arterial 56.0 (*) 80.0 - 100.0 mmHg    Bicarbonate 20.3  20.0 - 24.0 mEq/L    TCO2 21  0 - 100 mmol/L    O2 Saturation 90.0      Acid-base deficit 3.0 (*) 0.0 - 2.0 mmol/L    Collection site RADIAL, ALLEN'S TEST ACCEPTABLE      Drawn by Operator      Sample type ARTERIAL     URINALYSIS, ROUTINE W REFLEX MICROSCOPIC     Status: Abnormal   Collection Time   08/21/12  8:32 PM      Component Value Range Comment   Color, Urine RED (*) YELLOW BIOCHEMICALS MAY BE AFFECTED BY COLOR   APPearance TURBID (*) CLEAR    Specific Gravity, Urine 1.023  1.005 - 1.030    pH 5.5  5.0 - 8.0    Glucose, UA 100 (*)  NEGATIVE mg/dL    Hgb urine dipstick LARGE (*) NEGATIVE    Bilirubin Urine LARGE (*) NEGATIVE    Ketones, ur 15 (*) NEGATIVE mg/dL    Protein, ur 30 (*) NEGATIVE mg/dL    Urobilinogen, UA 2.0 (*) 0.0 - 1.0 mg/dL    Nitrite POSITIVE (*) NEGATIVE    Leukocytes, UA SMALL (*) NEGATIVE   URINE MICROSCOPIC-ADD ON     Status: Abnormal   Collection Time   08/21/12  8:32 PM      Component Value Range Comment   Squamous Epithelial / LPF MANY (*) RARE    WBC, UA 0-2  <3 WBC/hpf    RBC / HPF 21-50  <3 RBC/hpf    Bacteria, UA FEW (*) RARE    Casts CELLULAR CASTS PRESENT  NEGATIVE HYALINE CASTS   Urine-Other MUCOUS PRESENT     HEPATITIS PANEL, ACUTE     Status: Normal   Collection Time   08/21/12  9:55 PM      Component Value Range Comment   Hepatitis B Surface Ag NEGATIVE   NEGATIVE    HCV Ab NEGATIVE  NEGATIVE    Hep A IgM NEGATIVE  NEGATIVE    Hep B C IgM NEGATIVE  NEGATIVE   CBC     Status: Abnormal   Collection Time   08/22/12  6:05 AM      Component Value Range Comment   WBC 8.9  4.0 - 10.5 K/uL    RBC 2.76 (*) 4.22 - 5.81 MIL/uL    Hemoglobin 9.2 (*) 13.0 - 17.0 g/dL    HCT 45.4 (*) 09.8 - 52.0 %    MCV 96.7  78.0 - 100.0 fL    MCH 33.3  26.0 - 34.0 pg    MCHC 34.5  30.0 - 36.0 g/dL    RDW 11.9  14.7 - 82.9 %    Platelets 110 (*) 150 - 400 K/uL   COMPREHENSIVE METABOLIC PANEL     Status: Abnormal   Collection Time   08/22/12  6:05 AM      Component Value Range Comment   Sodium 137  135 - 145 mEq/L    Potassium 3.4 (*) 3.5 - 5.1 mEq/L    Chloride 105  96 - 112 mEq/L    CO2 21  19 - 32 mEq/L    Glucose, Bld 78  70 - 99 mg/dL    BUN 12  6 - 23 mg/dL    Creatinine, Ser 5.62 (*) 0.50 - 1.35 mg/dL    Calcium 7.4 (*) 8.4 - 10.5 mg/dL    Total Protein 6.9  6.0 - 8.3 g/dL    Albumin 1.5 (*) 3.5 - 5.2 g/dL    AST 65 (*) 0 - 37 U/L    ALT 18  0 - 53 U/L    Alkaline Phosphatase 311 (*) 39 - 117 U/L    Total Bilirubin 3.0 (*) 0.3 - 1.2 mg/dL    GFR calc non Af Amer 22 (*) >90 mL/min    GFR calc Af Amer 26 (*) >90 mL/min   PROTIME-INR     Status: Abnormal   Collection Time   08/22/12  6:05 AM      Component Value Range Comment   Prothrombin Time 20.0 (*) 11.6 - 15.2 seconds    INR 1.77 (*) 0.00 - 1.49    US Abdomen Complete  08/21/2012  *RADIOLOGY REPORT*  Clinical Data:  Leg swelling  COMPLETE ABDOMINAL ULTRASOUND  Comparison:  CT  01/16/2006  Findings:  Gallbladder:  The gallbladder is thick-walled likely related to ascites.  The gallbladder wall measures 3 mm.  There is pericholecystic fluid also likely relate to ascites.  No echogenic gallstones are present.  Gallbladder is not distended. Negative sonographic Murphy's sign.  Common bile duct:  Normal at 3 mm.  Liver:  Liver has a shrunken nodular contour and is echogenic.  No ductal  dilatation.   Portal veins are grossly patent.  IVC:  Appears normal.  Pancreas:  Poorly visualized  Spleen:  Normal size and echogenicity.  Right Kidney:  11.1cm in length.  No evidence of hydronephrosis or stones.  Left Kidney:  10.4cm in length.  No evidence of hydronephrosis or stones.  Abdominal aorta:  No aneurysm identified.  There is a large volume of intraperitoneal free fluid all four quadrants.  IMPRESSION:  1.  Large volume ascites in all four quadrants. 2.  Shrunken nodular liver consistent with cirrhosis. 3.  Gallbladder wall thickening is likely passive edema from ascites.   Original Report Authenticated By: Genevive Bi, M.D.    Dg Abd Acute W/chest  08/21/2012  *RADIOLOGY REPORT*  Clinical Data: Tightness in abdomen.  Swelling in legs.  History of constipation.  ACUTE ABDOMEN SERIES (ABDOMEN 2 VIEW & CHEST 1 VIEW)  Comparison: 05/15/2006  Findings: Frontal view of the chest demonstrates midline trachea. Normal heart size and mediastinal contours.  Mild left hemidiaphragm elevation. No pleural effusion or pneumothorax. Diminished lung volumes with mild bibasilar atelectasis.  EKG lead artifact projects over the left upper lobe.  Abominal films demonstrate no free intraperitoneal air on upright positioning.  No significant air fluid levels.  Small bowel loops which measure up to 3.4 cm in the central abdomen.  Mildly dilated.  Paucity of colonic gas.  Minimal distal/rectal gas.  Vascular calcifications.  IMPRESSION:  1.  Nonspecific mildly dilated small bowel loops within the central abdomen.  No significant air fluid levels or evidence of free intraperitoneal air.  Considerations include low grade partial small bowel obstruction or mild adynamic ileus.  Given the centralized bowel loops, ascites would be an alternate concern. Plain film follow up or CT should be considered. 2. No acute cardiopulmonary disease.   Original Report Authenticated By: Consuello Bossier, M.D.    Review of Systems  Constitutional:  Positive for malaise/fatigue. Negative for fever, chills and diaphoresis.  HENT: Negative.   Eyes: Negative.   Respiratory: Negative.   Cardiovascular: Positive for leg swelling. Negative for chest pain, palpitations, orthopnea and PND.  Gastrointestinal: Negative for heartburn, nausea, vomiting, abdominal pain, diarrhea, constipation, blood in stool and melena.  Genitourinary: Negative.   Musculoskeletal: Negative.   Skin: Positive for rash.  Neurological: Positive for tingling, sensory change, seizures and weakness. Negative for dizziness, tremors, speech change, focal weakness and loss of consciousness.  Psychiatric/Behavioral: Negative for depression, suicidal ideas, hallucinations, memory loss and substance abuse. The patient is nervous/anxious and has insomnia.    Blood pressure 109/72, pulse 104, temperature 98.8 F (37.1 C), temperature source Oral, resp. rate 20, height 5\' 11"  (1.803 m), weight 67.1 kg (147 lb 14.9 oz), SpO2 91.00%. Physical Exam  Constitutional: He is oriented to person, place, and time. He appears well-developed and well-nourished.  HENT:  Head: Normocephalic and atraumatic.  Eyes: Conjunctivae normal and EOM are normal.  Neck: Normal range of motion. No tracheal deviation present. No thyromegaly present.  Cardiovascular: Normal rate, regular rhythm and normal heart sounds.   Respiratory: Effort normal and breath sounds  normal. No respiratory distress. He has no wheezes. He has no rales. He exhibits no tenderness.  GI: Soft. Bowel sounds are normal. He exhibits distension. He exhibits no mass. There is no tenderness. There is no rebound and no guarding.  Musculoskeletal: Normal range of motion.  Neurological: He is alert and oriented to person, place, and time.  Skin: Skin is warm and dry.  Psychiatric: He has a normal mood and affect. His behavior is normal. Judgment and thought content normal.    Assessment/Plan: 1) Alcoholic cirrhosis with ascites,  thrombocytopenia and coagulopathy PT/INR of 20/1.77; [total Bilirubin of 3.07] and an albumin of 1.5 g/dl]. Start Lasix 40 mg per day for now.  2) ?Acute vs chronic renal disease: I doubt this is HRS, in light of the fact that he has proteinuria, a urinary sediment. Urine sodium levels are not available. Renal consult has been requested. I spoke to Dr. Ival Bible. 3) Tingling in the feet: ?alcoholic neuropathy. Check B12 and Folate levels as well.  4) Anemia: check Iron levels along with a Ferritin. ?anemia of chronic disease. Isabellah Sobocinski 08/22/2012, 6:06 PM

## 2012-08-22 NOTE — Progress Notes (Signed)
Pt admitted to the unit. Pt is alert and oriented. Pt oriented to room, staff, and call bell. Bed in lowest position. Full assessment to Epic. Call bell with in reach. Told to call for assists. Will continue to monitor.  Patrick [name]  

## 2012-08-23 ENCOUNTER — Inpatient Hospital Stay (HOSPITAL_COMMUNITY): Payer: Medicaid Other

## 2012-08-23 DIAGNOSIS — D689 Coagulation defect, unspecified: Secondary | ICD-10-CM | POA: Diagnosis present

## 2012-08-23 DIAGNOSIS — N179 Acute kidney failure, unspecified: Secondary | ICD-10-CM | POA: Diagnosis present

## 2012-08-23 DIAGNOSIS — D649 Anemia, unspecified: Secondary | ICD-10-CM | POA: Diagnosis present

## 2012-08-23 DIAGNOSIS — E46 Unspecified protein-calorie malnutrition: Secondary | ICD-10-CM | POA: Diagnosis present

## 2012-08-23 DIAGNOSIS — D696 Thrombocytopenia, unspecified: Secondary | ICD-10-CM

## 2012-08-23 LAB — FOLATE: Folate: >20 ng/mL

## 2012-08-23 LAB — SODIUM, URINE, RANDOM: Sodium, Ur: <10 mEq/L

## 2012-08-23 LAB — CBC
HCT: 26.3 % — ABNORMAL LOW (ref 39.0–52.0)
Hemoglobin: 9.4 g/dL — ABNORMAL LOW (ref 13.0–17.0)
MCH: 33.7 pg (ref 26.0–34.0)
MCHC: 35.7 g/dL (ref 30.0–36.0)
MCV: 94.3 fL (ref 78.0–100.0)
Platelets: 98 10*3/uL — ABNORMAL LOW (ref 150–400)
RBC: 2.79 MIL/uL — ABNORMAL LOW (ref 4.22–5.81)
RDW: 14.4 % (ref 11.5–15.5)
WBC: 9.6 10*3/uL (ref 4.0–10.5)

## 2012-08-23 LAB — COMPREHENSIVE METABOLIC PANEL
Albumin: 1.4 g/dL — ABNORMAL LOW (ref 3.5–5.2)
Alkaline Phosphatase: 301 U/L — ABNORMAL HIGH (ref 39–117)
BUN: 13 mg/dL (ref 6–23)
Potassium: 3.5 mEq/L (ref 3.5–5.1)
Sodium: 137 mEq/L (ref 135–145)
Total Protein: 6.8 g/dL (ref 6.0–8.3)

## 2012-08-23 LAB — URINALYSIS, ROUTINE W REFLEX MICROSCOPIC
Hgb urine dipstick: NEGATIVE
Ketones, ur: NEGATIVE mg/dL
Leukocytes, UA: NEGATIVE
Protein, ur: NEGATIVE mg/dL
Urobilinogen, UA: 1 mg/dL (ref 0.0–1.0)

## 2012-08-23 LAB — IRON AND TIBC
Iron: 104 ug/dL (ref 42–135)
UIBC: <15 ug/dL — ABNORMAL LOW (ref 125–400)

## 2012-08-23 LAB — HEMOGLOBIN A1C
Hgb A1c MFr Bld: 5.2 % (ref <5.7)
Mean Plasma Glucose: 103 mg/dL (ref <117)

## 2012-08-23 LAB — BODY FLUID CELL COUNT WITH DIFFERENTIAL
Eos, Fluid: 0 %
Total Nucleated Cell Count, Fluid: 53 cu mm (ref 0–1000)

## 2012-08-23 LAB — VITAMIN B12: Vitamin B-12: >2000 pg/mL — ABNORMAL HIGH (ref 211–911)

## 2012-08-23 LAB — ALBUMIN, FLUID (OTHER): Albumin, Fluid: 0.2 g/dL

## 2012-08-23 LAB — OCCULT BLOOD X 1 CARD TO LAB, STOOL
Fecal Occult Bld: NEGATIVE
Fecal Occult Bld: NEGATIVE

## 2012-08-23 LAB — PROTEIN / CREATININE RATIO, URINE
Protein Creatinine Ratio: 0.14 (ref 0.00–0.15)
Total Protein, Urine: 26.8 mg/dL

## 2012-08-23 LAB — CREATININE, URINE, RANDOM: Creatinine, Urine: 397.27 mg/dL

## 2012-08-23 LAB — FERRITIN: Ferritin: 1332 ng/mL — ABNORMAL HIGH (ref 22–322)

## 2012-08-23 LAB — URINE CULTURE
Colony Count: NO GROWTH
Culture: NO GROWTH

## 2012-08-23 MED ORDER — ALBUMIN HUMAN 25 % IV SOLN
75.0000 g | Freq: Once | INTRAVENOUS | Status: AC
  Administered 2012-08-23: 75 g via INTRAVENOUS
  Filled 2012-08-23: qty 300

## 2012-08-23 MED ORDER — ENSURE COMPLETE PO LIQD
237.0000 mL | ORAL | Status: DC
  Administered 2012-08-23: 237 mL via ORAL

## 2012-08-23 MED ORDER — LACTULOSE 10 GM/15ML PO SOLN: 30.0000 g | Freq: Two times a day (BID) | ORAL | Status: DC

## 2012-08-23 MED ORDER — ENSURE COMPLETE PO LIQD: 237.0000 mL | Freq: Two times a day (BID) | ORAL | Status: DC

## 2012-08-23 MED ORDER — LACTULOSE 10 GM/15ML PO SOLN
30.0000 g | Freq: Every day | ORAL | Status: DC
  Administered 2012-08-23 – 2012-08-24 (×2): 30 g via ORAL
  Filled 2012-08-23 (×2): qty 45

## 2012-08-23 MED ORDER — FUROSEMIDE 40 MG PO TABS
40.0000 mg | ORAL_TABLET | Freq: Every day | ORAL | Status: DC
  Administered 2012-08-23 – 2012-08-24 (×2): 40 mg via ORAL
  Filled 2012-08-23 (×2): qty 1

## 2012-08-23 NOTE — Procedures (Signed)
Procedure : diagnostic and therapeutic paracentesis Specimen : 4.4  L yellow serous fluid complications : none immediate  Fluid sent to labs as requested. Full report in canopy

## 2012-08-23 NOTE — Progress Notes (Signed)
Patient ID: Patrick [name], male   DOB: 1961-03-22, 51 y.o.   MRN: [id number] [facility] PROGRESS NOTE  Patrick [name] [id number] DOB: 09-25-1961 DOA: 08/21/2012 PCP: No primary provider on file.  Assessment/Plan: Principal Problem:  *Alcoholic cirrhosis of liver with ascites Active Problems:  Ascites  Acute renal failure  Anemia  Malnutrition  Tight Ascites.   D/C IVF.    Patient not having difficulty breathing at this point.  Appreciate Dr. Kenna Gilbert consultation.  Lasix 40 mg po daily has been started.  Further Renal has recommended paracentesis to reduce abdominal hypertension that may be contributing to renal failure.  Cirrhosis with Acute liver failure.  Acute Hepatitis panel is negative. This is almost certainly alcohol related. On 2 gram sodium diet, daily weights. Lactulose started to clear any encephalopathy as the patient's ammonia level was mildly elevated and he appears confused at times.  He has received a dose of Vitamin K IV.  We will manage symptoms and follow Dr. Kenna Gilbert recommendations regarding his liver disease.  Acute Renal Failure.  Appreciate Dr. Eliane Decree consultation and recommendations.  Work up in progress to rule out glomerulonephritis.  Will move forward with recommended paracentesis to reduce abdominal hypertension.  Creatinine improved significantly (3.07 - 1.81) today.     Anemia.  Appreciate both GI and renal input. Likely a combination of malnutrition and bone marrow suppression.  Further results pending.  UTI.  On rocephin.  Cultures pending.  History of ETOH withdraw seizures.  CIWA protocol in place.  ETOH abuse. On folic acid and thiamine.  Will request social work to counsel and offer outpatient vs inpatient rehab.  Code Status: full code Family Communication: communicated with patient at bedside Disposition Plan: to home when medically ready.  Brief narrative: Patrick [name] is a 51 y.o. male who per his own reluctant admission and his  wife's admission has not been taking very good care of himself it sounds like. Specifically he has been using EtOH (consisting of wine) very heavily on a daily basis for many years. Although he initially denies EtOH abuse in the ED, further history taking from him and his wife reveal that he drinks very heavily, every day, and his wife has been trying to get him to see a doctor for the past 6 years.  Unfortunately he has had a slowly progressive decline that culminated in abdominal distention (not pain) for the past 2 weeks, the patients wife was finally able to convince him to go to the ED.    Consultants: Gastroenterology, Dr. Loreta Ave Nephrology, Dr. Allena Katz  Procedures: Paracentesis today with albumin administration.  Antibiotics: Rocephin    Objective: Filed Vitals:   08/22/12 1425 08/22/12 2101 08/22/12 2336 08/23/12 0436  BP: 109/72 95/66 95/62  98/66  Pulse: 104 107 99 107  Temp: 98.8 F (37.1 C) 99.7 F (37.6 C)  99.1 F (37.3 C)  TempSrc: Oral Oral  Oral  Resp: 20 20  28   Height:      Weight:    69.1 kg (152 lb 5.4 oz)  SpO2: 91% 86%  92%    Intake/Output Summary (Last 24 hours) at 08/23/12 1121 Last data filed at 08/23/12 0900  Gross per 24 hour  Intake    190 ml  Output    100 ml  Net     90 ml    Exam:   General:  Awake, NAD, mildly confused.  Cardiovascular: RRR, No M/R/G, no Lower Extremity Edema  Respiratory: CTA, No increased work of breathing  Abdomen:  Distended and tight.  + BS. Non Tender,  I'm unable to appreciate masses due to tight ascites.  Neurological:  Cranial Nerves 2 - 12 are grossly intact, exam non focal  Psychiatric:  A&O, Cooperative  Skin:  No rashes, bruises, or lesions  Data Reviewed: Basic Metabolic Panel:  Lab 08/23/12 1610 08/22/12 0605 08/21/12 1151 08/21/12 0956  NA 137 137 135 140  K 3.5 3.4* 3.6 4.2  CL 107 105 99 107  CO2 21 21 18* --  GLUCOSE 104* 78 142* 134*  BUN 13 12 11 12   CREATININE 1.81* 3.07* 3.63* 3.70*    CALCIUM 7.6* 7.4* 8.3* --  MG -- -- -- --  PHOS -- -- -- --   Liver Function Tests:  Lab 08/23/12 0600 08/22/12 0605 08/21/12 1151  AST 63* 65* 86*  ALT 17 18 24   ALKPHOS 301* 311* 393*  BILITOT 2.9* 3.0* 3.9*  PROT 6.8 6.9 8.5*  ALBUMIN 1.4* 1.5* 1.9*    Lab 08/21/12 1151  LIPASE 7*  AMYLASE --    Lab 08/21/12 1808  AMMONIA 94*   CBC:  Lab 08/23/12 0600 08/22/12 0605 08/21/12 1151 08/21/12 0956  WBC 9.6 8.9 12.1* --  NEUTROABS -- -- 9.1* --  HGB 9.4* 9.2* 10.5* 10.9*  HCT 26.3* 26.7* 29.6* 32.0*  MCV 94.3 96.7 96.1 --  PLT 98* 110* 147* --    BNP (last 3 results)  Basename 08/21/12 1152  PROBNP 60.2   CBG:   Studies: US Abdomen Complete  08/21/2012  *RADIOLOGY REPORT*  Clinical Data:  Leg swelling  COMPLETE ABDOMINAL ULTRASOUND  Comparison:  CT 01/16/2006  Findings:  Gallbladder:  The gallbladder is thick-walled likely related to ascites.  The gallbladder wall measures 3 mm.  There is pericholecystic fluid also likely relate to ascites.  No echogenic gallstones are present.  Gallbladder is not distended. Negative sonographic Murphy's sign.  Common bile duct:  Normal at 3 mm.  Liver:  Liver has a shrunken nodular contour and is echogenic.  No ductal  dilatation.  Portal veins are grossly patent.  IVC:  Appears normal.  Pancreas:  Poorly visualized  Spleen:  Normal size and echogenicity.  Right Kidney:  11.1cm in length.  No evidence of hydronephrosis or stones.  Left Kidney:  10.4cm in length.  No evidence of hydronephrosis or stones.  Abdominal aorta:  No aneurysm identified.  There is a large volume of intraperitoneal free fluid all four quadrants.  IMPRESSION:  1.  Large volume ascites in all four quadrants. 2.  Shrunken nodular liver consistent with cirrhosis. 3.  Gallbladder wall thickening is likely passive edema from ascites.   Original Report Authenticated By: Genevive Bi, M.D.    Dg Abd Acute W/chest  08/21/2012  *RADIOLOGY REPORT*  Clinical Data:  Tightness in abdomen.  Swelling in legs.  History of constipation.  ACUTE ABDOMEN SERIES (ABDOMEN 2 VIEW & CHEST 1 VIEW)  Comparison: 05/15/2006  Findings: Frontal view of the chest demonstrates midline trachea. Normal heart size and mediastinal contours.  Mild left hemidiaphragm elevation. No pleural effusion or pneumothorax. Diminished lung volumes with mild bibasilar atelectasis.  EKG lead artifact projects over the left upper lobe.  Abominal films demonstrate no free intraperitoneal air on upright positioning.  No significant air fluid levels.  Small bowel loops which measure up to 3.4 cm in the central abdomen.  Mildly dilated.  Paucity of colonic gas.  Minimal distal/rectal gas.  Vascular calcifications.  IMPRESSION:  1.  Nonspecific mildly  dilated small bowel loops within the central abdomen.  No significant air fluid levels or evidence of free intraperitoneal air.  Considerations include low grade partial small bowel obstruction or mild adynamic ileus.  Given the centralized bowel loops, ascites would be an alternate concern. Plain film follow up or CT should be considered. 2. No acute cardiopulmonary disease.   Original Report Authenticated By: Consuello Bossier, M.D.     Scheduled Meds:    . albumin human  75 g Intravenous Once  . cefTRIAXone (ROCEPHIN)  IV  1 g Intravenous Q24H  . feeding supplement  237 mL Oral Q24H  . folic acid  1 mg Oral Daily  . furosemide  40 mg Oral Daily  . lactulose  30 g Oral Daily  . multivitamin with minerals  1 tablet Oral Daily  . thiamine  100 mg Oral Daily   Or  . thiamine  100 mg Intravenous Daily  . DISCONTD: sodium chloride   Intravenous STAT  . DISCONTD: feeding supplement  237 mL Oral Q24H  . DISCONTD: feeding supplement  237 mL Oral BID BM  . DISCONTD: lactulose  30 g Oral BID   Continuous Infusions:    Stephani Police [facility] Pager [phone]  If 7PM-7AM, please contact night-coverage [email or web address] Password [email or web address] 08/23/2012, 11:21  AM   LOS: 2 days

## 2012-08-23 NOTE — Progress Notes (Signed)
Addendum  Patient seen and examined, chart and data base reviewed.  I agree with the above assessment and plan  For full details please see Mrs. Algis Downs PA. Note.  Improved renal function, appreciate Dr. Eliane Decree and Dr. Kenna Gilbert help.  Check BMP in a.m. I will add lactulose because of his high ammonia level.  Clint Lipps Pager: [phone] 08/23/2012, 11:35 AM

## 2012-08-23 NOTE — Consult Note (Signed)
Reason for Consult: Evaluation of acute renal failure versus chronic kidney disease-staging unclear. Referring Physician: Dr. Jolee Ewing [facility]  [name] is an 51 y.o. male.  HPI:  51 year old African American man with past medical history that is unremarkable-predominantly due to his not having a primary care doctor/seeking medical care. Presents with a history of 1-2 weeks of worsening progressive abdominal distention without associated pain. Also states that he has been bothered by increasing lower extremity weakness/tingling/pain with minimal swelling. Reports subtle decrease in appetite and denies any nausea vomiting or diarrhea. Following evaluation in the emergency room noted to have features consistent with cirrhosis and portal hypertension with significant ascites. Creatinine on admission was 3.5 and following limited intravenous fluids, has improved to 1.8 today. Of note, he was started on furosemide yesterday for the management of his ascites. Prior to admission, Mr. Bomkamp reports that he is taking about 2-4 tablets of ibuprofen everyday for dental pain. Denies any orthostatic symptoms however recall is poor.  Denies a prior history of acute renal failure/history of chronic kidney disease. Denies personal or family history of autoimmune diseases including lupus. Denies a history of kidney stone or urinary tract infections. Denies any dysuria, urgency, frequency or hematuria. Denies any foamy urine. Concerning findings on his initial urinalysis was a presence of hematuria (25-30 rbc's per high per field with what appears to be cellular casts) and proteinuria. Urine sodium <10 mEq. Renal ultrasound reviewed, negative for obstruction/hydronephrosis. 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  Past Medical History  Diagnosis Date  . ETOH abuse     History reviewed. No pertinent past surgical  history.  History reviewed. No pertinent family history.  Social History:  reports that he has been smoking Cigarettes.  He has been smoking about .5 packs per day. He does not have any smokeless tobacco history on file. He reports that he drinks about 4.8 ounces of alcohol per week. He reports that he does not use illicit drugs.  Allergies: No Known Allergies  Medications:  Scheduled:   . albumin human  75 g Intravenous Once  . cefTRIAXone (ROCEPHIN)  IV  1 g Intravenous Q24H  . feeding supplement  237 mL Oral Q24H  . folic acid  1 mg Oral Daily  . lactulose  30 g Oral BID  . multivitamin with minerals  1 tablet Oral Daily  . thiamine  100 mg Oral Daily   Or  . thiamine  100 mg Intravenous Daily  . DISCONTD: sodium chloride   Intravenous STAT    Results for orders placed during the hospital encounter of 08/21/12 (from the past 48 hour(s))  CBC WITH DIFFERENTIAL     Status: Abnormal   Collection Time   08/21/12 11:51 AM      Component Value Range Comment   WBC 12.1 (*) 4.0 - 10.5 K/uL    RBC 3.08 (*) 4.22 - 5.81 MIL/uL    Hemoglobin 10.5 (*) 13.0 - 17.0 g/dL    HCT 29.5 (*) 62.1 - 52.0 %    MCV 96.1  78.0 - 100.0 fL    MCH 34.1 (*) 26.0 - 34.0 pg    MCHC 35.5  30.0 - 36.0 g/dL    RDW 30.8  65.7 - 84.6 %    Platelets 147 (*) 150 - 400 K/uL    Neutrophils Relative 75  43 - 77 %    Neutro Abs 9.1 (*) 1.7 - 7.7 K/uL  Lymphocytes Relative 16  12 - 46 %    Lymphs Abs 2.0  0.7 - 4.0 K/uL    Monocytes Relative 9  3 - 12 %    Monocytes Absolute 1.0  0.1 - 1.0 K/uL    Eosinophils Relative 0  0 - 5 %    Eosinophils Absolute 0.0  0.0 - 0.7 K/uL    Basophils Relative 0  0 - 1 %    Basophils Absolute 0.0  0.0 - 0.1 K/uL   COMPREHENSIVE METABOLIC PANEL     Status: Abnormal   Collection Time   08/21/12 11:51 AM      Component Value Range Comment   Sodium 135  135 - 145 mEq/L    Potassium 3.6  3.5 - 5.1 mEq/L    Chloride 99  96 - 112 mEq/L    CO2 18 (*) 19 - 32 mEq/L    Glucose,  Bld 142 (*) 70 - 99 mg/dL    BUN 11  6 - 23 mg/dL    Creatinine, Ser 1.61 (*) 0.50 - 1.35 mg/dL    Calcium 8.3 (*) 8.4 - 10.5 mg/dL    Total Protein 8.5 (*) 6.0 - 8.3 g/dL    Albumin 1.9 (*) 3.5 - 5.2 g/dL    AST 86 (*) 0 - 37 U/L    ALT 24  0 - 53 U/L    Alkaline Phosphatase 393 (*) 39 - 117 U/L    Total Bilirubin 3.9 (*) 0.3 - 1.2 mg/dL    GFR calc non Af Amer 18 (*) >90 mL/min    GFR calc Af Amer 21 (*) >90 mL/min   LIPASE, BLOOD     Status: Abnormal   Collection Time   08/21/12 11:51 AM      Component Value Range Comment   Lipase 7 (*) 11 - 59 U/L   PROTIME-INR     Status: Abnormal   Collection Time   08/21/12 11:51 AM      Component Value Range Comment   Prothrombin Time 18.8 (*) 11.6 - 15.2 seconds    INR 1.63 (*) 0.00 - 1.49   APTT     Status: Abnormal   Collection Time   08/21/12 11:51 AM      Component Value Range Comment   aPTT 42 (*) 24 - 37 seconds   PRO B NATRIURETIC PEPTIDE     Status: Normal   Collection Time   08/21/12 11:52 AM      Component Value Range Comment   Pro B Natriuretic peptide (BNP) 60.2  0 - 125 pg/mL   HEPATITIS PANEL, ACUTE     Status: Normal   Collection Time   08/21/12  5:49 PM      Component Value Range Comment   Hepatitis B Surface Ag NEGATIVE  NEGATIVE    HCV Ab NEGATIVE  NEGATIVE    Hep A IgM NEGATIVE  NEGATIVE    Hep B C IgM NEGATIVE  NEGATIVE   GAMMA GT     Status: Abnormal   Collection Time   08/21/12  5:49 PM      Component Value Range Comment   GGT 419 (*) 7 - 51 U/L   ACETAMINOPHEN LEVEL     Status: Normal   Collection Time   08/21/12  5:50 PM      Component Value Range Comment   Acetaminophen (Tylenol), Serum <15.0  10 - 30 ug/mL   AMMONIA     Status: Abnormal   Collection  Time   08/21/12  6:08 PM      Component Value Range Comment   Ammonia 94 (*) 11 - 60 umol/L   POCT I-STAT 3, BLOOD GAS (G3+)     Status: Abnormal   Collection Time   08/21/12  6:25 PM      Component Value Range Comment   pH, Arterial 7.444  7.350 -  7.450    pCO2 arterial 29.5 (*) 35.0 - 45.0 mmHg    pO2, Arterial 56.0 (*) 80.0 - 100.0 mmHg    Bicarbonate 20.3  20.0 - 24.0 mEq/L    TCO2 21  0 - 100 mmol/L    O2 Saturation 90.0      Acid-base deficit 3.0 (*) 0.0 - 2.0 mmol/L    Collection site RADIAL, ALLEN'S TEST ACCEPTABLE      Drawn by Operator      Sample type ARTERIAL     URINALYSIS, ROUTINE W REFLEX MICROSCOPIC     Status: Abnormal   Collection Time   08/21/12  8:32 PM      Component Value Range Comment   Color, Urine RED (*) YELLOW BIOCHEMICALS MAY BE AFFECTED BY COLOR   APPearance TURBID (*) CLEAR    Specific Gravity, Urine 1.023  1.005 - 1.030    pH 5.5  5.0 - 8.0    Glucose, UA 100 (*) NEGATIVE mg/dL    Hgb urine dipstick LARGE (*) NEGATIVE    Bilirubin Urine LARGE (*) NEGATIVE    Ketones, ur 15 (*) NEGATIVE mg/dL    Protein, ur 30 (*) NEGATIVE mg/dL    Urobilinogen, UA 2.0 (*) 0.0 - 1.0 mg/dL    Nitrite POSITIVE (*) NEGATIVE    Leukocytes, UA SMALL (*) NEGATIVE   URINE MICROSCOPIC-ADD ON     Status: Abnormal   Collection Time   08/21/12  8:32 PM      Component Value Range Comment   Squamous Epithelial / LPF MANY (*) RARE    WBC, UA 0-2  <3 WBC/hpf    RBC / HPF 21-50  <3 RBC/hpf    Bacteria, UA FEW (*) RARE    Casts CELLULAR CASTS PRESENT  NEGATIVE HYALINE CASTS   Urine-Other MUCOUS PRESENT     HEPATITIS PANEL, ACUTE     Status: Normal   Collection Time   08/21/12  9:55 PM      Component Value Range Comment   Hepatitis B Surface Ag NEGATIVE  NEGATIVE    HCV Ab NEGATIVE  NEGATIVE    Hep A IgM NEGATIVE  NEGATIVE    Hep B C IgM NEGATIVE  NEGATIVE   CBC     Status: Abnormal   Collection Time   08/22/12  6:05 AM      Component Value Range Comment   WBC 8.9  4.0 - 10.5 K/uL    RBC 2.76 (*) 4.22 - 5.81 MIL/uL    Hemoglobin 9.2 (*) 13.0 - 17.0 g/dL    HCT 52.8 (*) 41.3 - 52.0 %    MCV 96.7  78.0 - 100.0 fL    MCH 33.3  26.0 - 34.0 pg    MCHC 34.5  30.0 - 36.0 g/dL    RDW 24.4  01.0 - 27.2 %    Platelets 110  (*) 150 - 400 K/uL   COMPREHENSIVE METABOLIC PANEL     Status: Abnormal   Collection Time   08/22/12  6:05 AM      Component Value Range Comment   Sodium 137  135 - 145 mEq/L    Potassium 3.4 (*) 3.5 - 5.1 mEq/L    Chloride 105  96 - 112 mEq/L    CO2 21  19 - 32 mEq/L    Glucose, Bld 78  70 - 99 mg/dL    BUN 12  6 - 23 mg/dL    Creatinine, Ser 1.91 (*) 0.50 - 1.35 mg/dL    Calcium 7.4 (*) 8.4 - 10.5 mg/dL    Total Protein 6.9  6.0 - 8.3 g/dL    Albumin 1.5 (*) 3.5 - 5.2 g/dL    AST 65 (*) 0 - 37 U/L    ALT 18  0 - 53 U/L    Alkaline Phosphatase 311 (*) 39 - 117 U/L    Total Bilirubin 3.0 (*) 0.3 - 1.2 mg/dL    GFR calc non Af Amer 22 (*) >90 mL/min    GFR calc Af Amer 26 (*) >90 mL/min   PROTIME-INR     Status: Abnormal   Collection Time   08/22/12  6:05 AM      Component Value Range Comment   Prothrombin Time 20.0 (*) 11.6 - 15.2 seconds    INR 1.77 (*) 0.00 - 1.49   RETICULOCYTES     Status: Abnormal   Collection Time   08/22/12  9:58 PM      Component Value Range Comment   Retic Ct Pct 2.0  0.4 - 3.1 %    RBC. 2.80 (*) 4.22 - 5.81 MIL/uL    Retic Count, Manual 56.0  19.0 - 186.0 K/uL   COMPREHENSIVE METABOLIC PANEL     Status: Abnormal   Collection Time   08/23/12  6:00 AM      Component Value Range Comment   Sodium 137  135 - 145 mEq/L    Potassium 3.5  3.5 - 5.1 mEq/L    Chloride 107  96 - 112 mEq/L    CO2 21  19 - 32 mEq/L    Glucose, Bld 104 (*) 70 - 99 mg/dL    BUN 13  6 - 23 mg/dL    Creatinine, Ser 4.78 (*) 0.50 - 1.35 mg/dL DELTA CHECK NOTED   Calcium 7.6 (*) 8.4 - 10.5 mg/dL    Total Protein 6.8  6.0 - 8.3 g/dL    Albumin 1.4 (*) 3.5 - 5.2 g/dL    AST 63 (*) 0 - 37 U/L    ALT 17  0 - 53 U/L    Alkaline Phosphatase 301 (*) 39 - 117 U/L    Total Bilirubin 2.9 (*) 0.3 - 1.2 mg/dL    GFR calc non Af Amer 42 (*) >90 mL/min    GFR calc Af Amer 49 (*) >90 mL/min   CBC     Status: Abnormal   Collection Time   08/23/12  6:00 AM      Component Value Range Comment    WBC 9.6  4.0 - 10.5 K/uL    RBC 2.79 (*) 4.22 - 5.81 MIL/uL    Hemoglobin 9.4 (*) 13.0 - 17.0 g/dL    HCT 29.5 (*) 62.1 - 52.0 %    MCV 94.3  78.0 - 100.0 fL    MCH 33.7  26.0 - 34.0 pg    MCHC 35.7  30.0 - 36.0 g/dL    RDW 30.8  65.7 - 84.6 %    Platelets 98 (*) 150 - 400 K/uL CONSISTENT WITH PREVIOUS RESULT  CREATININE, URINE, RANDOM     Status:  Normal   Collection Time   08/23/12  7:40 AM      Component Value Range Comment   Creatinine, Urine 397.27     SODIUM, URINE, RANDOM     Status: Normal   Collection Time   08/23/12  7:40 AM      Component Value Range Comment   Sodium, Ur <10       US Abdomen Complete  08/21/2012  *RADIOLOGY REPORT*  Clinical Data:  Leg swelling  COMPLETE ABDOMINAL ULTRASOUND  Comparison:  CT 01/16/2006  Findings:  Gallbladder:  The gallbladder is thick-walled likely related to ascites.  The gallbladder wall measures 3 mm.  There is pericholecystic fluid also likely relate to ascites.  No echogenic gallstones are present.  Gallbladder is not distended. Negative sonographic Murphy's sign.  Common bile duct:  Normal at 3 mm.  Liver:  Liver has a shrunken nodular contour and is echogenic.  No ductal  dilatation.  Portal veins are grossly patent.  IVC:  Appears normal.  Pancreas:  Poorly visualized  Spleen:  Normal size and echogenicity.  Right Kidney:  11.1cm in length.  No evidence of hydronephrosis or stones.  Left Kidney:  10.4cm in length.  No evidence of hydronephrosis or stones.  Abdominal aorta:  No aneurysm identified.  There is a large volume of intraperitoneal free fluid all four quadrants.  IMPRESSION:  1.  Large volume ascites in all four quadrants. 2.  Shrunken nodular liver consistent with cirrhosis. 3.  Gallbladder wall thickening is likely passive edema from ascites.   Original Report Authenticated By: Genevive Bi, M.D.    Dg Abd Acute W/chest  08/21/2012  *RADIOLOGY REPORT*  Clinical Data: Tightness in abdomen.  Swelling in legs.  History of  constipation.  ACUTE ABDOMEN SERIES (ABDOMEN 2 VIEW & CHEST 1 VIEW)  Comparison: 05/15/2006  Findings: Frontal view of the chest demonstrates midline trachea. Normal heart size and mediastinal contours.  Mild left hemidiaphragm elevation. No pleural effusion or pneumothorax. Diminished lung volumes with mild bibasilar atelectasis.  EKG lead artifact projects over the left upper lobe.  Abominal films demonstrate no free intraperitoneal air on upright positioning.  No significant air fluid levels.  Small bowel loops which measure up to 3.4 cm in the central abdomen.  Mildly dilated.  Paucity of colonic gas.  Minimal distal/rectal gas.  Vascular calcifications.  IMPRESSION:  1.  Nonspecific mildly dilated small bowel loops within the central abdomen.  No significant air fluid levels or evidence of free intraperitoneal air.  Considerations include low grade partial small bowel obstruction or mild adynamic ileus.  Given the centralized bowel loops, ascites would be an alternate concern. Plain film follow up or CT should be considered. 2. No acute cardiopulmonary disease.   Original Report Authenticated By: Consuello Bossier, M.D.     Review of Systems  Constitutional: Positive for malaise/fatigue. Negative for fever, chills, weight loss and diaphoresis.  HENT: Negative for hearing loss, ear pain, nosebleeds, congestion, sore throat, tinnitus and ear discharge.        Dental Pain- for the past 2 weeks   Eyes: Negative.   Respiratory: Positive for shortness of breath. Negative for cough, hemoptysis, sputum production, wheezing and stridor.        Orthopneic and dyspneic on extertion  Cardiovascular: Positive for orthopnea and leg swelling.  Gastrointestinal: Positive for heartburn and nausea. Negative for vomiting, abdominal pain, diarrhea, constipation, blood in stool and melena.       Abdominal distension without pain  Genitourinary: Negative.   Musculoskeletal: Negative.   Skin: Negative.   Neurological:  Positive for tingling and sensory change. Negative for headaches.       Neuropathic pain and tingling over lower extremities   Endo/Heme/Allergies: Negative.   Psychiatric/Behavioral: Positive for memory loss. Negative for depression, suicidal ideas, hallucinations and substance abuse. The patient is nervous/anxious. The patient does not have insomnia.   All other systems reviewed and are negative.   Blood pressure 98/66, pulse 107, temperature 99.1 F (37.3 C), temperature source Oral, resp. rate 28, height 5\' 11"  (1.803 m), weight 69.1 kg (152 lb 5.4 oz), SpO2 92.00%. Physical Exam  Nursing note and vitals reviewed. Constitutional: He appears well-developed. No distress.       Bitemporal wasting noted   HENT:  Head: Normocephalic.  Nose: Nose normal.  Mouth/Throat: No oropharyngeal exudate.       Dry oral mucosa   Eyes: Conjunctivae normal and EOM are normal. Pupils are equal, round, and reactive to light. Right eye exhibits no discharge. Left eye exhibits no discharge. No scleral icterus.  Neck: Normal range of motion. Neck supple. JVD present. No tracheal deviation present. No thyromegaly present.       6-7cm JVD  Cardiovascular: Normal rate and regular rhythm.  Exam reveals no gallop and no friction rub.   Murmur heard.      ESM over apex   Respiratory: Effort normal and breath sounds normal. No stridor. He has no wheezes. He has no rales.       Decreased BS left base  GI: Bowel sounds are normal. He exhibits distension. He exhibits no mass. There is tenderness. There is no rebound and no guarding.       Firm with ascites  Musculoskeletal: Normal range of motion. He exhibits edema.       Trace ankle edema  Lymphadenopathy:    He has no cervical adenopathy.  Neurological: He is alert. Coordination abnormal.       Oriented to place and vaguely to person/time  Skin: Skin is warm and dry. No rash noted. He is not diaphoretic. No erythema. There is pallor.  Psychiatric:        Very forgetful with poor 5 minute recall    Assessment/Plan: 1. Acute renal failure versus chronic kidney disease. No compelling indication that this is chronic kidney disease based on his renal ultrasound that demonstrates a fairly preserved renal size/structure. It appears that he has likely suffered acute renal failure. Urinary findings concerning for the possibility of glomerulonephritis however, improvement of renal function without any obvious intervention weighs against the same. I will empirically go ahead and check an antinuclear antibody and antineutrophil cytoplasmic antibodies. Given the presence of tense ascites and low urinary sodium, I suspect that intra-abdominal hypertension may be playing a role to his acute renal failure. Recommend going forward with paracentesis and intravenous albumin administration (75 g). Cannot determine whether this is hepatorenal syndrome given the presence of proteinuria and hematuria-both of which should be negative with HRS. It is plausible that he has a pre-existing renal injury/chronic kidney disease-etiology of which would be unclear at present. No acute electrolyte issues are recognized/no uremic symptoms identified. Nonsteroidal anti-inflammatory drug use prior to his hospitalization with its consequent hemodynamic effects may have also lead to hemodynamic compromise to renal blood flow/acute renal failure-these have appropriately been discontinued. No intravenous contrast exposure is noted in medication doses are verified to be appropriately adjusted for his decline in renal function. He is marginally hypotensive however  I do not feel that he warrants initiation of midodrine/octreotide at this time. 2. Alcoholic cirrhosis with ascites/portal hypertension: Given his current mental status, agree with the hospitalist assessment that he may be displaying subtle hepatic encephalopathy given his elevated ammonia levels-agree with lactulose therapy for symptomatic  management. We'll await further directions on management of his cirrhosis from gastroenterology. 3. Anemia: Suspect due to bone marrow toxicity from ongoing alcohol use/malnutrition with relative deficiencies of vitamin B12/iron. Will check iron stores and repeat if needed. 4. Hypoalbuminemia: Secondary to functional hepatic defect, regulate protein intake secondary to his propensity for hepatic encephalopathy/ammonia load.  Jailin Moomaw K. 08/23/2012, 10:32 AM

## 2012-08-23 NOTE — Care Management Note (Signed)
    Page 1 of 1   08/24/2012     12:40:28 PM   CARE MANAGEMENT NOTE 08/24/2012  Patient:  Patrick [name], Patrick [name]   Account Number:  [id number]  Date Initiated:  08/23/2012  Documentation initiated by:  Letha Cape  Subjective/Objective Assessment:   dx alcoholic cirrhosis of liver with ascites  admit- lives with spouse, pta independent.     Action/Plan:   Anticipated DC Date:  08/25/2012   Anticipated DC Plan:  HOME/SELF CARE      DC Planning Services  CM consult      Choice offered to / List presented to:             Status of service:  Completed, signed off Medicare Important Message given?   (If response is "NO", the following Medicare IM given date fields will be blank) Date Medicare IM given:   Date Additional Medicare IM given:    Discharge Disposition:  HOME/SELF CARE  Per UR Regulation:  Reviewed for med. necessity/level of care/duration of stay  If discussed at Long Length of Stay Meetings, dates discussed:    Comments:  PCP Jovita Kussmaul appt made.  08/24/12 12:38 Letha Cape RN, BSN [phone] pt  dc to home today, decided he will not use medication ast on this admit.  08/23/12 16:47 Letha Cape RN, BSN [phone] patient lives with spouse, pta independent.  Patient will need medication ast at discharge, pt is eligible for med ast.  NCM will continue to follow for discharge needs.

## 2012-08-23 NOTE — Progress Notes (Signed)
Subjective: No acute events.  Feeling better after the paracentesis.  Objective: Vital signs in last 24 hours: Temp:  [99.1 F (37.3 C)-99.7 F (37.6 C)] 99.4 F (37.4 C) (10/03 1300) Pulse Rate:  [99-116] 116  (10/03 1300) Resp:  [20-28] 20  (10/03 1300) BP: (95-104)/(62-74) 101/71 mmHg (10/03 1612) SpO2:  [86 %-92 %] 91 % (10/03 1300) Weight:  [69.1 kg (152 lb 5.4 oz)] 69.1 kg (152 lb 5.4 oz) (10/03 0436) Last BM Date: 08/23/12  Intake/Output from previous day: 10/02 0701 - 10/03 0700 In: 320 [P.O.:320] Out: -  Intake/Output this shift: Total I/O In: 110 [P.O.:110] Out: 100 [Urine:100]  General appearance: alert and no distress GI: soft,but distended with ascites.  Lab Results:  Basename 08/23/12 0600 08/22/12 0605 08/21/12 1151  WBC 9.6 8.9 12.1*  HGB 9.4* 9.2* 10.5*  HCT 26.3* 26.7* 29.6*  PLT 98* 110* 147*   BMET  Basename 08/23/12 0600 08/22/12 0605 08/21/12 1151  NA 137 137 135  K 3.5 3.4* 3.6  CL 107 105 99  CO2 21 21 18*  GLUCOSE 104* 78 142*  BUN 13 12 11   CREATININE 1.81* 3.07* 3.63*  CALCIUM 7.6* 7.4* 8.3*   LFT  Basename 08/23/12 0600  PROT 6.8  ALBUMIN 1.4*  AST 63*  ALT 17  ALKPHOS 301*  BILITOT 2.9*  BILIDIR --  IBILI --   PT/INR  Basename 08/22/12 0605 08/21/12 1151  LABPROT 20.0* 18.8*  INR 1.77* 1.63*   Hepatitis Panel  Basename 08/21/12 2155  HEPBSAG NEGATIVE  HCVAB NEGATIVE  HEPAIGM NEGATIVE  HEPBIGM NEGATIVE   C-Diff No results found for this basename: CDIFFTOX:3 in the last 72 hours Fecal Lactopherrin No results found for this basename: FECLLACTOFRN in the last 72 hours  Studies/Results: US Abdomen Complete  08/21/2012  *RADIOLOGY REPORT*  Clinical Data:  Leg swelling  COMPLETE ABDOMINAL ULTRASOUND  Comparison:  CT 01/16/2006  Findings:  Gallbladder:  The gallbladder is thick-walled likely related to ascites.  The gallbladder wall measures 3 mm.  There is pericholecystic fluid also likely relate to ascites.  No  echogenic gallstones are present.  Gallbladder is not distended. Negative sonographic Murphy's sign.  Common bile duct:  Normal at 3 mm.  Liver:  Liver has a shrunken nodular contour and is echogenic.  No ductal  dilatation.  Portal veins are grossly patent.  IVC:  Appears normal.  Pancreas:  Poorly visualized  Spleen:  Normal size and echogenicity.  Right Kidney:  11.1cm in length.  No evidence of hydronephrosis or stones.  Left Kidney:  10.4cm in length.  No evidence of hydronephrosis or stones.  Abdominal aorta:  No aneurysm identified.  There is a large volume of intraperitoneal free fluid all four quadrants.  IMPRESSION:  1.  Large volume ascites in all four quadrants. 2.  Shrunken nodular liver consistent with cirrhosis. 3.  Gallbladder wall thickening is likely passive edema from ascites.   Original Report Authenticated By: Genevive Bi, M.D.     Medications:  Scheduled:   . albumin human  75 g Intravenous Once  . cefTRIAXone (ROCEPHIN)  IV  1 g Intravenous Q24H  . feeding supplement  237 mL Oral Q24H  . folic acid  1 mg Oral Daily  . furosemide  40 mg Oral Daily  . lactulose  30 g Oral Daily  . multivitamin with minerals  1 tablet Oral Daily  . thiamine  100 mg Oral Daily   Or  . thiamine  100 mg Intravenous Daily  .  DISCONTD: feeding supplement  237 mL Oral Q24H  . DISCONTD: feeding supplement  237 mL Oral BID BM  . DISCONTD: lactulose  30 g Oral BID   Continuous:   Assessment/Plan: 1) ETOH cirrhosis 2) Ascites   The patient feels better with the paracentesis.  4.4 liters were removed and he is anxious to leave.  Creatinine has also improved and he is on lasix 40 mg qd.  Spironolactone cannot be used at this time because of his elevated creatinine.  His creatinine may also increase after the paracentesis secondary to the volume shifts.  If he remains abstinent he can be stable for a while.  Plan: 1) Low sodium diet, less than 2 grams per day. 2) Monitor creatinine. 3)  Continue with lasix for now.  LOS: 2 days   [place] D 08/23/2012, 5:51 PM

## 2012-08-24 DIAGNOSIS — K7290 Hepatic failure, unspecified without coma: Secondary | ICD-10-CM | POA: Diagnosis present

## 2012-08-24 DIAGNOSIS — D649 Anemia, unspecified: Secondary | ICD-10-CM

## 2012-08-24 DIAGNOSIS — K7682 Hepatic encephalopathy: Secondary | ICD-10-CM | POA: Diagnosis present

## 2012-08-24 LAB — COMPREHENSIVE METABOLIC PANEL
Albumin: 2.4 g/dL — ABNORMAL LOW (ref 3.5–5.2)
Alkaline Phosphatase: 246 U/L — ABNORMAL HIGH (ref 39–117)
BUN: 12 mg/dL (ref 6–23)
CO2: 22 mEq/L (ref 19–32)
Chloride: 106 mEq/L (ref 96–112)
Creatinine, Ser: 1.16 mg/dL (ref 0.50–1.35)
GFR calc Af Amer: 83 mL/min — ABNORMAL LOW (ref >90)
GFR calc non Af Amer: 72 mL/min — ABNORMAL LOW (ref >90)
Glucose, Bld: 108 mg/dL — ABNORMAL HIGH (ref 70–99)
Potassium: 3.7 mEq/L (ref 3.5–5.1)
Total Bilirubin: 3.2 mg/dL — ABNORMAL HIGH (ref 0.3–1.2)

## 2012-08-24 LAB — CBC
Platelets: 102 10*3/uL — ABNORMAL LOW (ref 150–400)
RBC: 2.62 MIL/uL — ABNORMAL LOW (ref 4.22–5.81)
RDW: 14.5 % (ref 11.5–15.5)
WBC: 9.6 10*3/uL (ref 4.0–10.5)

## 2012-08-24 LAB — PATHOLOGIST SMEAR REVIEW: Path Review: REACTIVE

## 2012-08-24 MED ORDER — ADULT MULTIVITAMIN W/MINERALS CH: 1.0000 | ORAL_TABLET | Freq: Every day | ORAL | Status: DC

## 2012-08-24 MED ORDER — FOLIC ACID 1 MG PO TABS: 1.0000 mg | ORAL_TABLET | Freq: Every day | ORAL | Status: DC

## 2012-08-24 MED ORDER — FUROSEMIDE 40 MG PO TABS: 40.0000 mg | ORAL_TABLET | Freq: Every day | ORAL | Status: DC

## 2012-08-24 MED ORDER — THIAMINE HCL 100 MG PO TABS: 100.0000 mg | ORAL_TABLET | Freq: Every day | ORAL | Status: DC

## 2012-08-24 MED ORDER — ENSURE COMPLETE PO LIQD: 237.0000 mL | ORAL | Status: DC

## 2012-08-24 MED ORDER — LACTULOSE 10 GM/15ML PO SOLN: 30.0000 g | Freq: Every day | ORAL | Status: DC | PRN

## 2012-08-24 NOTE — Progress Notes (Signed)
Patrick [name] to be D/C'd Home per MD order.  Discussed with the patient and all questions fully answered.   Netta Corrigan B  Home Medication Instructions [id number]   Printed on:08/24/12 1007  Medication Information                    hydroxypropyl methylcellulose (ISOPTO TEARS) 2.5 % ophthalmic solution Place 2 drops into both eyes 4 (four) times daily as needed. For dry eyes           feeding supplement (ENSURE COMPLETE) LIQD Take 237 mLs by mouth daily.           folic acid (FOLVITE) 1 MG tablet Take 1 tablet (1 mg total) by mouth daily.           furosemide (LASIX) 40 MG tablet Take 1 tablet (40 mg total) by mouth daily.           lactulose (CHRONULAC) 10 GM/15ML solution Take 45 mLs (30 g total) by mouth daily as needed (Confusion or increased sleepiness).           thiamine 100 MG tablet Take 1 tablet (100 mg total) by mouth daily.           Multiple Vitamin (MULTIVITAMIN WITH MINERALS) TABS Take 1 tablet by mouth daily.             VVS, Skin clean, dry and intact without evidence of skin break down, no evidence of skin tears noted. IV catheter discontinued intact. Site without signs and symptoms of complications. Dressing and pressure applied.  An After Visit Summary was printed and given to the patient. Follow up appointments , new prescriptions and medication administration times given Patient escorted via WC, and D/C home via private auto.  Cindra Eves, RN 08/24/2012 10:07 AM

## 2012-08-24 NOTE — Progress Notes (Signed)
Subjective: No acute events.  Feels well.  Objective: Vital signs in last 24 hours: Temp:  [99.4 F (37.4 C)-99.8 F (37.7 C)] 99.8 F (37.7 C) (10/04 0617) Pulse Rate:  [113-118] 118  (10/04 0617) Resp:  [18-20] 20  (10/04 0617) BP: (92-104)/(60-74) 92/60 mmHg (10/04 0617) SpO2:  [91 %-94 %] 92 % (10/04 0617) Weight:  [65.3 kg (143 lb 15.4 oz)] 65.3 kg (143 lb 15.4 oz) (10/04 0617) Last BM Date: 08/23/12  Intake/Output from previous day: 10/03 0701 - 10/04 0700 In: 210 [P.O.:160; IV Piggyback:50] Out: 360 [Urine:160; Stool:200] Intake/Output this shift:    General appearance: alert and no distress GI: + ascites, soft  Lab Results:  Basename 08/24/12 0510 08/23/12 0600 08/22/12 0605  WBC 9.6 9.6 8.9  HGB 8.8* 9.4* 9.2*  HCT 24.5* 26.3* 26.7*  PLT 102* 98* 110*   BMET  Basename 08/24/12 0510 08/23/12 0600 08/22/12 0605  NA 138 137 137  K 3.7 3.5 3.4*  CL 106 107 105  CO2 22 21 21   GLUCOSE 108* 104* 78  BUN 12 13 12   CREATININE 1.16 1.81* 3.07*  CALCIUM 8.1* 7.6* 7.4*   LFT  Basename 08/24/12 0510  PROT 6.8  ALBUMIN 2.4*  AST 54*  ALT 15  ALKPHOS 246*  BILITOT 3.2*  BILIDIR --  IBILI --   PT/INR  Basename 08/22/12 0605 08/21/12 1151  LABPROT 20.0* 18.8*  INR 1.77* 1.63*   Hepatitis Panel  Basename 08/21/12 2155  HEPBSAG NEGATIVE  HCVAB NEGATIVE  HEPAIGM NEGATIVE  HEPBIGM NEGATIVE   C-Diff No results found for this basename: CDIFFTOX:3 in the last 72 hours Fecal Lactopherrin No results found for this basename: FECLLACTOFRN in the last 72 hours  Studies/Results: No results found.  Medications:  Scheduled:   . albumin human  75 g Intravenous Once  . cefTRIAXone (ROCEPHIN)  IV  1 g Intravenous Q24H  . feeding supplement  237 mL Oral Q24H  . folic acid  1 mg Oral Daily  . furosemide  40 mg Oral Daily  . lactulose  30 g Oral Daily  . multivitamin with minerals  1 tablet Oral Daily  . thiamine  100 mg Oral Daily   Or  . thiamine  100  mg Intravenous Daily  . DISCONTD: feeding supplement  237 mL Oral Q24H  . DISCONTD: feeding supplement  237 mL Oral BID BM  . DISCONTD: lactulose  30 g Oral BID   Continuous:   Assessment/Plan: 1) ETOH cirrhosis.   I did discuss with him the importance of abstaining from ETOH and to remain on a low sodium diet <2000 mg.  He appears to be stable for discharge today.  Plan: 1) Lasix 40 mg QD. 2) Low sodium diet. 3) Follow up in two weeks. 4) No ETOH.  LOS: 3 days   Cai Flott D 08/24/2012, 8:25 AM

## 2012-08-24 NOTE — Progress Notes (Signed)
Patient ID: Patrick [name], male   DOB: Sep 04, 1961, 51 y.o.   MRN: [id number]   [facility] Progress Note    Subjective:   Reports to be feeling better after paracentesis yesterday. Denies any SOB/Chest pain.   Objective:   BP 92/60  Pulse 118  Temp 99.8 F (37.7 C) (Oral)  Resp 20  Ht 5\' 11"  (1.803 m)  Wt 65.3 kg (143 lb 15.4 oz)  BMI 20.08 kg/m2  SpO2 92%  Intake/Output Summary (Last 24 hours) at 08/24/12 0942 Last data filed at 08/23/12 2145  Gross per 24 hour  Intake    110 ml  Output    260 ml  Net   -150 ml   Weight change: -3.8 kg (-8 lb 6 oz)  Physical Exam: WUJ:[id number] ambulating hallway AOZ:HYQMV RRR, ESM over apex unchanged Resp:Decreased BS right base otherwise CTA Abd: Softer today, distended, BS normal Ext:No LE edema  Imaging: No results found.  Labs: BMET  Lab 08/24/12 0510 08/23/12 0600 08/22/12 0605 08/21/12 1151 08/21/12 0956  NA 138 137 137 135 140  K 3.7 3.5 3.4* 3.6 4.2  CL 106 107 105 99 107  CO2 22 21 21  18* --  GLUCOSE 108* 104* 78 142* 134*  BUN 12 13 12 11 12   CREATININE 1.16 1.81* 3.07* 3.63* 3.70*  ALB -- -- -- -- --  CALCIUM 8.1* 7.6* 7.4* 8.3* --  PHOS -- -- -- -- --   CBC  Lab 08/24/12 0510 08/23/12 0600 08/22/12 0605 08/21/12 1151  WBC 9.6 9.6 8.9 12.1*  NEUTROABS -- -- -- 9.1*  HGB 8.8* 9.4* 9.2* 10.5*  HCT 24.5* 26.3* 26.7* 29.6*  MCV 93.5 94.3 96.7 96.1  PLT 102* 98* 110* 147*    Medications:      . albumin human  75 g Intravenous Once  . cefTRIAXone (ROCEPHIN)  IV  1 g Intravenous Q24H  . feeding supplement  237 mL Oral Q24H  . folic acid  1 mg Oral Daily  . furosemide  40 mg Oral Daily  . lactulose  30 g Oral Daily  . multivitamin with minerals  1 tablet Oral Daily  . thiamine  100 mg Oral Daily   Or  . thiamine  100 mg Intravenous Daily  . DISCONTD: feeding supplement  237 mL Oral Q24H  . DISCONTD: feeding supplement  237 mL Oral BID BM  . DISCONTD: lactulose  30 g Oral BID      Assessment/ Plan:   1. Acute renal failure versus chronic kidney disease. Suspected to be from IAH vs hemodynamic injury-Renal function continues to improve and current GFR in normal limit. Would have him follow up closely with his PCP for management/monitoring on diuretic therapy on furosemide. No scheduled renal follow up at present.  2. Alcoholic cirrhosis with ascites/portal hypertension: Clinically better- f/u with GI as scheduled on lasix, may be able to add spironolactone with monitoring as OP.  3. Anemia: Suspect due to bone marrow toxicity from ongoing alcohol use/malnutrition with relative deficiencies of vitamin B12/iron.  Low iron stores but elevated ferritin prohibitive to IV Fe therapy- High risk of Fe toxicity with cirrhosis 4. Hypoalbuminemia: Secondary to functional hepatic defect, regulate protein intake secondary to his propensity for hepatic encephalopathy/ammonia load.   Zetta Bills, MD 08/24/2012, 9:42 AM

## 2012-08-24 NOTE — Clinical Social Work Psychosocial (Signed)
     Clinical Social Work Department BRIEF PSYCHOSOCIAL ASSESSMENT 08/24/2012  Patient:  Patrick [name], Patrick [name]     Account Number:  [id number]     Admit date:  08/21/2012  Clinical Social Worker:  Jacelyn Grip  Date/Time:  08/24/2012 09:43 AM  Referred by:  Physician  Date Referred:  08/24/2012 Referred for  Substance Abuse   Other Referral:   Interview type:  Patient Other interview type:    PSYCHOSOCIAL DATA Living Status:  SIGNIFICANT OTHER Admitted from facility:   Level of care:   Primary support name:  Doyce Para Primary support relationship to patient:  SPOUSE Degree of support available:   strong    CURRENT CONCERNS Current Concerns  Substance Abuse   Other Concerns:    SOCIAL WORK ASSESSMENT / PLAN CSW received referral for current substance abuse. CSW met with pt at bedside and pt significant other present, but CSW asked significant other to meet with pt alone and pt significant other agreeable.    CSW met with pt alone to discuss current substance abuse. CSW discussed MD concerns surrounding pt use of alcohol. Pt stated that he has previously had an issue with alcohol, but stopped drinking 3-4 weeks ago. CSW inquired about what assist pt with maintaining sobreity and pt stated that he drinks coffee and water and enjoys taking walks. CSW offered resources to assist pt in maintaining sobreity and pt stated that he "likes to deal with things on his own". CSW discussed Alcoholics Anonymous as a resource if pt needed additional support in the future. Pt stated that he plans to maintain not drinking as he recognizes how drinking has impacted him medically. SBIRT not completed due to pt stating he stopped drinking 3-4 weeks ago. No further social work needs identified at this time. Pt discharging home today. CSW signing off.   Assessment/plan status:  No Further Intervention Required Other assessment/ plan:   Information/referral to community resources:   Pt  refused list of resources, however CSW discussed Alcoholic Anonymous with pt    PATIENTS/FAMILYS RESPONSE TO PLAN OF CARE: Pt alert and oriented and pleasant. Pt openly discussed his recent difficulties with alcohol and how he recently stopped drinking on his own and plans to maintain sobriety. Pt appears motivated and was educated about resources to assist if pt interested in the future.

## 2012-08-24 NOTE — Discharge Summary (Signed)
Physician Discharge Summary  Patrick [name] WUJ:[id number] DOB: 12/17/1960 DOA: 08/21/2012  PCP: No primary provider on file.  Admit date: 08/21/2012 Discharge date: 08/24/2012  Recommendations for Outpatient Follow-up:  Patient has recently been started on Lasix and will follow in two weeks with Dr. Elnoria Howard to monitor ascites and kidney function.  Discharge Diagnoses:  Principal Problem:  *Alcoholic cirrhosis of liver with ascites Active Problems:  Ascites  Anemia  Acute renal failure  Coagulopathy  Hepatic encephalopathy  Malnutrition  Thrombocytopenia    Discharge Condition: Improved, but liver has permanent damage and tenuous function.  Diet recommendation: Low Salt.  Drink 1 ensure complete daily.  Continue multivitamin, folate, thiamine supplementation.  History of present illness:  Patrick [name] is a 51 y.o. male who per his own reluctant admission,he has been using EtOH (consisting of wine) very heavily on a daily basis for many years. Although he initially denies EtOH abuse in the ED, further history taking from him and his wife reveal that he drinks very heavily, every day, and his wife has been trying to get him to see a doctor for the past 6 years.  Unfortunately he has had a slowly progressive decline that culminated in abdominal distention (not pain) for the past 2 weeks, the patients wife was finally able to convince him to go to the ED.  On eval in the ED he has multiple laboratory abnormalities and a clinical picture that would appear to be cirrhosis of the liver possibly even ESLD.  Hospital Course:   Tight Ascites.  The patient underwent paracentesis primarily to reduce abdominal hypertension and improve his kidney function.  His abdomen is now soft and significantly less distended.  4.4 liters of Ascitic fluid was removed. Cultures and testing done to fluid had benign results.  Dr. Loreta Ave consulted and recommended starting 40 mg of lasix daily along with a low salt diet  to help prevent ascites from re accumulating quickly.  Cirrhosis with Acute liver failure and coagulopathy. The patient had a shrunken liver and large volume ascites on ultrasound consistent with cirrhosis.  LFTs have decreased slightly during his hospitalization.  Acute Hepatitis panel was negative. This is almost certainly alcohol related. He was placed on a 2 gram sodium diet, and daily weights were obtained. Lactulose started to clear any encephalopathy as the patient's ammonia level was mildly elevated and he appeared confused at times. He received a dose of Vitamin K IV which initially lowered his PT/ INR unfortunately these numbers are slowly trending back up.  Acute Renal Failure.  The patient's initial creatinine was 3.63.  ARF was likely caused by multiple factors including abdominal hypertension and heavy NSAID use.  Improved with IVF and improved further with paracentesis.  Today it is 1.16.  Nephrology,  Dr. Allena Katz,  Consulted and made recommendations. Work up in progress to rule out glomerulonephritis. C3 level low but within normal limits, C4 level within normal limits.    At Dr. Eliane Decree recommendation the patient underwent paracentesis and demonstrated significant improvement clinically as well as in his lab values.    Anemia. Appreciate both GI and renal input. Likely a combination of malnutrition and bone marrow suppression. He is guiac negative.  He has received B12 injections inpatient and will continue with vitamin, thiamine and folate supplementation.  UTI. Received 4 doses of IV rocephin. Cultures were negative.  No need for antibiotics post discharge.   History of ETOH withdraw seizures. Non occurred during this hospitalization.  ETOH abuse. On folic  acid and thiamine. Social work offered counseling and out patient resources to help with alcohol cessation.  Procedures: Paracentesis 08/23/2012.  Removed 4.4 liters.  Consultations:  Gastroenterology, Dr. Loreta Ave  Nephrology, Dr.  Allena Katz  Discharge Exam: Filed Vitals:   08/24/12 0617  BP: 92/60  Pulse: 118  Temp: 99.8 F (37.7 C)  Resp: 20   Filed Vitals:   08/23/12 1603 08/23/12 1612 08/23/12 2145 08/24/12 0617  BP: 95/66 101/71 97/64 92/60   Pulse:   113 118  Temp:   99.4 F (37.4 C) 99.8 F (37.7 C)  TempSrc:   Oral Oral  Resp:   18 20  Height:      Weight:    65.3 kg (143 lb 15.4 oz)  SpO2:   94% 92%   General:  A&O, Clearer mentally.  NAD Cardiovascular: RRR, No M/R/G Respiratory: CTA, No W/C/R Abdomen:  Soft, minimal distension, NT, +BM Extremities:  No lower extremity edema.  Discharge Instructions      Discharge Orders    Future Orders Please Complete By Expires   Diet - low sodium heart healthy      Increase activity slowly          Medication List     As of 08/24/2012  8:34 AM    STOP taking these medications         ibuprofen 200 MG tablet   Commonly known as: ADVIL,MOTRIN      TAKE these medications         feeding supplement Liqd   Take 237 mLs by mouth daily.      folic acid 1 MG tablet   Commonly known as: FOLVITE   Take 1 tablet (1 mg total) by mouth daily.      furosemide 40 MG tablet   Commonly known as: LASIX   Take 1 tablet (40 mg total) by mouth daily.      hydroxypropyl methylcellulose 2.5 % ophthalmic solution   Commonly known as: ISOPTO TEARS   Place 2 drops into both eyes 4 (four) times daily as needed. For dry eyes      lactulose 10 GM/15ML solution   Commonly known as: CHRONULAC   Take 45 mLs (30 g total) by mouth daily as needed (Confusion or increased sleepiness).      multivitamin with minerals Tabs   Take 1 tablet by mouth daily.      thiamine 100 MG tablet   Take 1 tablet (100 mg total) by mouth daily.        Follow-up Information    Follow up with [facility], [place]. On 09/20/2012. (11 am)    Contact information:   [address] [place] [address] [phone]           The results of significant diagnostics  from this hospitalization (including imaging, microbiology, ancillary and laboratory) are listed below for reference.    Significant Diagnostic Studies: US Abdomen Complete  08/21/2012  *RADIOLOGY REPORT*  Clinical Data:  Leg swelling  COMPLETE ABDOMINAL ULTRASOUND  Comparison:  CT 01/16/2006  Findings:  Gallbladder:  The gallbladder is thick-walled likely related to ascites.  The gallbladder wall measures 3 mm.  There is pericholecystic fluid also likely relate to ascites.  No echogenic gallstones are present.  Gallbladder is not distended. Negative sonographic Murphy's sign.  Common bile duct:  Normal at 3 mm.  Liver:  Liver has a shrunken nodular contour and is echogenic.  No ductal  dilatation.  Portal veins are grossly  patent.  IVC:  Appears normal.  Pancreas:  Poorly visualized  Spleen:  Normal size and echogenicity.  Right Kidney:  11.1cm in length.  No evidence of hydronephrosis or stones.  Left Kidney:  10.4cm in length.  No evidence of hydronephrosis or stones.  Abdominal aorta:  No aneurysm identified.  There is a large volume of intraperitoneal free fluid all four quadrants.  IMPRESSION:  1.  Large volume ascites in all four quadrants. 2.  Shrunken nodular liver consistent with cirrhosis. 3.  Gallbladder wall thickening is likely passive edema from ascites.   Original Report Authenticated By: Genevive Bi, M.D.    Dg Abd Acute W/chest  08/21/2012  *RADIOLOGY REPORT*  Clinical Data: Tightness in abdomen.  Swelling in legs.  History of constipation.  ACUTE ABDOMEN SERIES (ABDOMEN 2 VIEW & CHEST 1 VIEW)  Comparison: 05/15/2006  Findings: Frontal view of the chest demonstrates midline trachea. Normal heart size and mediastinal contours.  Mild left hemidiaphragm elevation. No pleural effusion or pneumothorax. Diminished lung volumes with mild bibasilar atelectasis.  EKG lead artifact projects over the left upper lobe.  Abominal films demonstrate no free intraperitoneal air on upright positioning.   No significant air fluid levels.  Small bowel loops which measure up to 3.4 cm in the central abdomen.  Mildly dilated.  Paucity of colonic gas.  Minimal distal/rectal gas.  Vascular calcifications.  IMPRESSION:  1.  Nonspecific mildly dilated small bowel loops within the central abdomen.  No significant air fluid levels or evidence of free intraperitoneal air.  Considerations include low grade partial small bowel obstruction or mild adynamic ileus.  Given the centralized bowel loops, ascites would be an alternate concern. Plain film follow up or CT should be considered. 2. No acute cardiopulmonary disease.   Original Report Authenticated By: Consuello Bossier, M.D.     Microbiology: Recent Results (from the past 240 hour(s))  URINE CULTURE     Status: Normal   Collection Time   08/22/12  5:00 PM      Component Value Range Status Comment   Specimen Description URINE, CLEAN CATCH   Final    Special Requests NONE   Final    Culture  Setup Time 08/22/2012 18:55   Final    Colony Count NO GROWTH   Final    Culture NO GROWTH   Final    Report Status 08/23/2012 FINAL   Final   BODY FLUID CULTURE     Status: Normal (Preliminary result)   Collection Time   08/23/12  3:35 PM      Component Value Range Status Comment   Specimen Description ASCITIC FLUID ABDOMEN   Final    Special Requests   Final    Gram Stain     Final    Value: NO WBC SEEN     NO ORGANISMS SEEN   Culture PENDING   Incomplete    Report Status PENDING   Incomplete      Labs: Basic Metabolic Panel:  Lab 08/24/12 1610 08/23/12 0600 08/22/12 0605 08/21/12 1151 08/21/12 0956  NA 138 137 137 135 140  K 3.7 3.5 3.4* 3.6 4.2  CL 106 107 105 99 107  CO2 22 21 21  18* --  GLUCOSE 108* 104* 78 142* 134*  BUN 12 13 12 11 12   CREATININE 1.16 1.81* 3.07* 3.63* 3.70*  CALCIUM 8.1* 7.6* 7.4* 8.3* --  MG -- -- -- -- --  PHOS -- -- -- -- --   Liver Function  Tests:  Lab 08/24/12 0510 08/23/12 0600 08/22/12 0605 08/21/12 1151  AST  54* 63* 65* 86*  ALT 15 17 18 24   ALKPHOS 246* 301* 311* 393*  BILITOT 3.2* 2.9* 3.0* 3.9*  PROT 6.8 6.8 6.9 8.5*  ALBUMIN 2.4* 1.4* 1.5* 1.9*    Lab 08/21/12 1151  LIPASE 7*  AMYLASE --    Lab 08/21/12 1808  AMMONIA 94*   CBC:  Lab 08/24/12 0510 08/23/12 0600 08/22/12 0605 08/21/12 1151 08/21/12 0956  WBC 9.6 9.6 8.9 12.1* --  NEUTROABS -- -- -- 9.1* --  HGB 8.8* 9.4* 9.2* 10.5* 10.9*  HCT 24.5* 26.3* 26.7* 29.6* 32.0*  MCV 93.5 94.3 96.7 96.1 --  PLT 102* 98* 110* 147* --    BNP (last 3 results)  Basename 08/21/12 1152  PROBNP 60.2    Time coordinating discharge: 40 min.  Signed:  Algis Downs, PA-C [facility] Pager: [phone]   08/24/2012, 8:34 AM

## 2012-08-24 NOTE — Discharge Summary (Signed)
Addendum  Patient seen and examined, chart and data base reviewed.  I agree with the above assessment and discharge plan.  For full details please see Mrs. Algis Downs PA. Note.  Cirrhosis likely secondary to EtOH, ARF resolved.  Clint Lipps Pager: [phone] 08/24/2012, 1:28 PM

## 2012-08-26 LAB — BODY FLUID CULTURE
Culture: NO GROWTH
Gram Stain: NONE SEEN

## 2012-08-27 ENCOUNTER — Telehealth (HOSPITAL_COMMUNITY): Payer: Self-pay | Admitting: *Deleted

## 2012-08-27 NOTE — Telephone Encounter (Signed)
Post procedure follow up call attempt.  No answer with available VM

## 2012-08-28 LAB — ANA: Anti Nuclear Antibody(ANA): NEGATIVE

## 2012-08-28 LAB — C4 COMPLEMENT: Complement C4, Body Fluid: 28 mg/dL (ref 10–40)

## 2013-11-18 IMAGING — US US PARACENTESIS
1 series · 6 of 6 positions shown · non-contrast
Comparison: Imaging of the abdomen by ultrasound on 08/21/2012.

CLINICAL DATA: New onset ascites of uncertain etiology.  Request
has been made for large volume therapeutic and diagnostic
paracentesis up to 4.5 liters.

ULTRASOUND GUIDED PARACENTESIS

[Series 1: us paracentesis · 0.32mm/px · 6 of 6 slices shown]
[im 1/6]
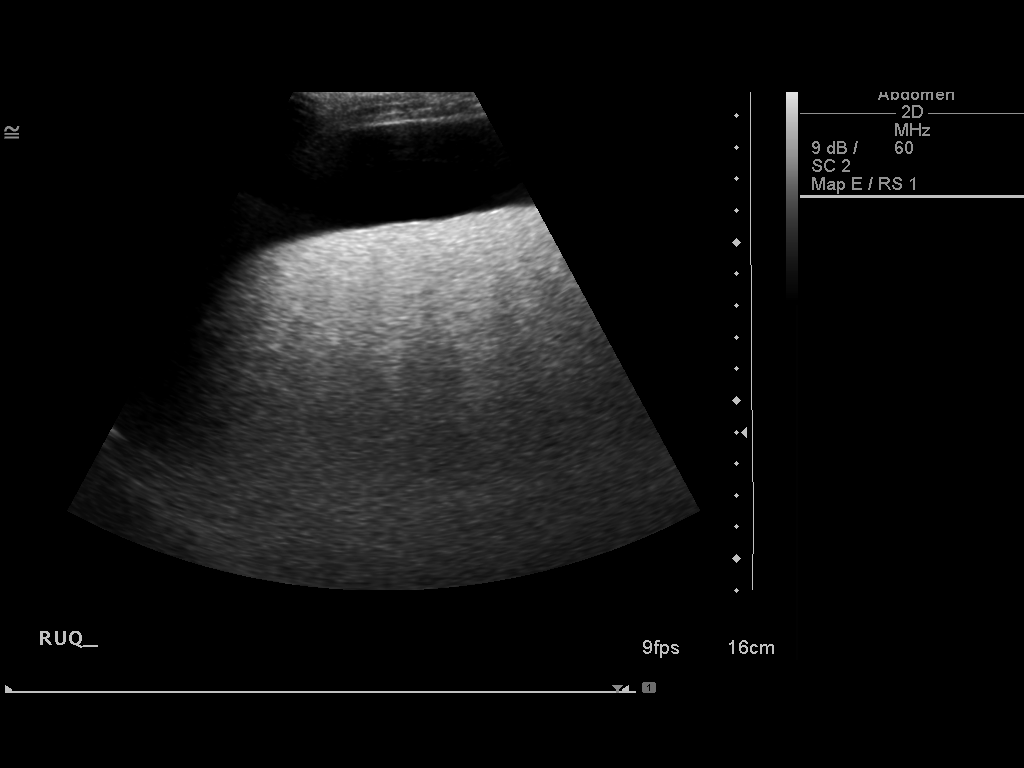
[im 2/6]
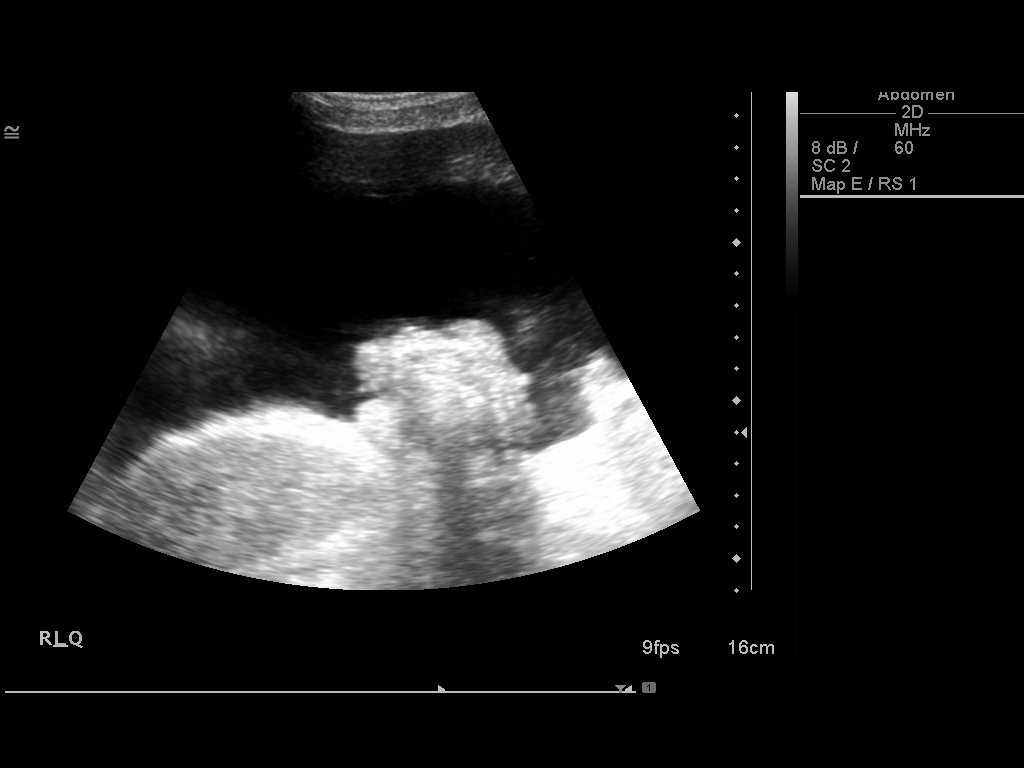
[im 3/6]
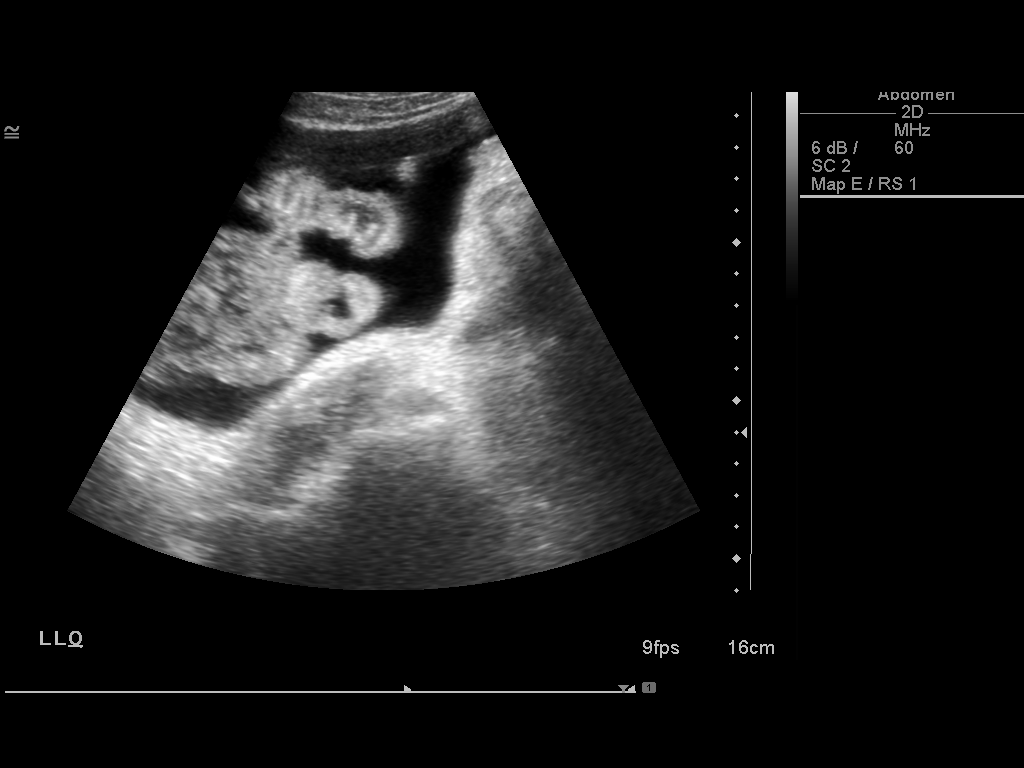
[im 4/6]
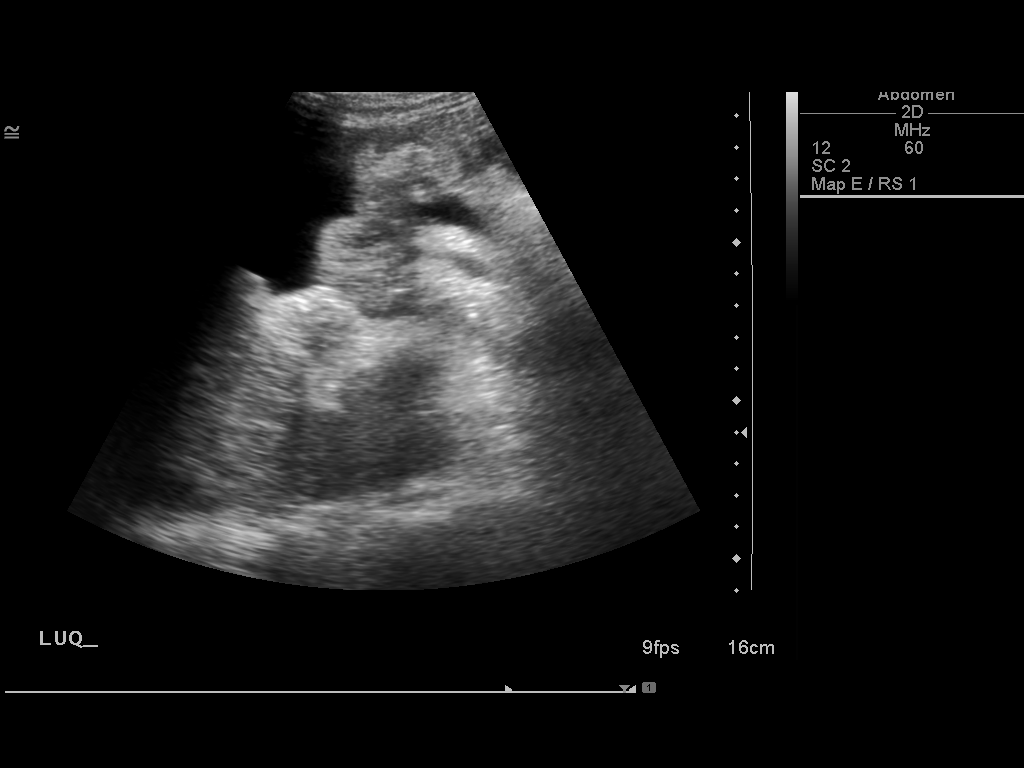
[im 5/6]
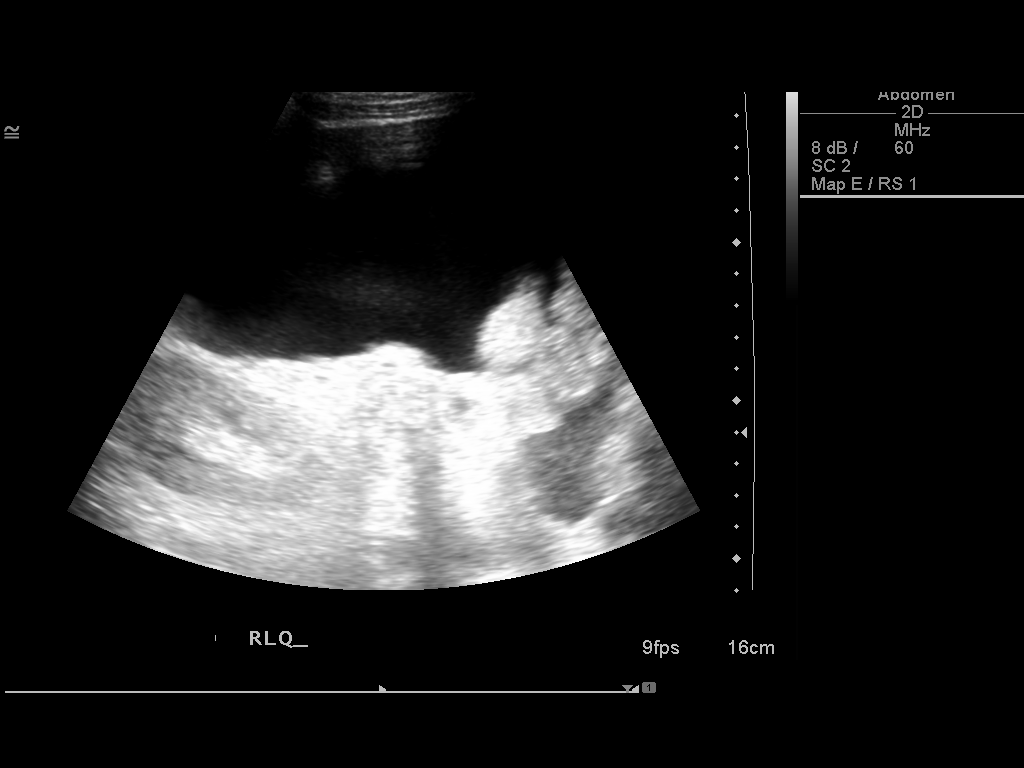
[im 6/6]
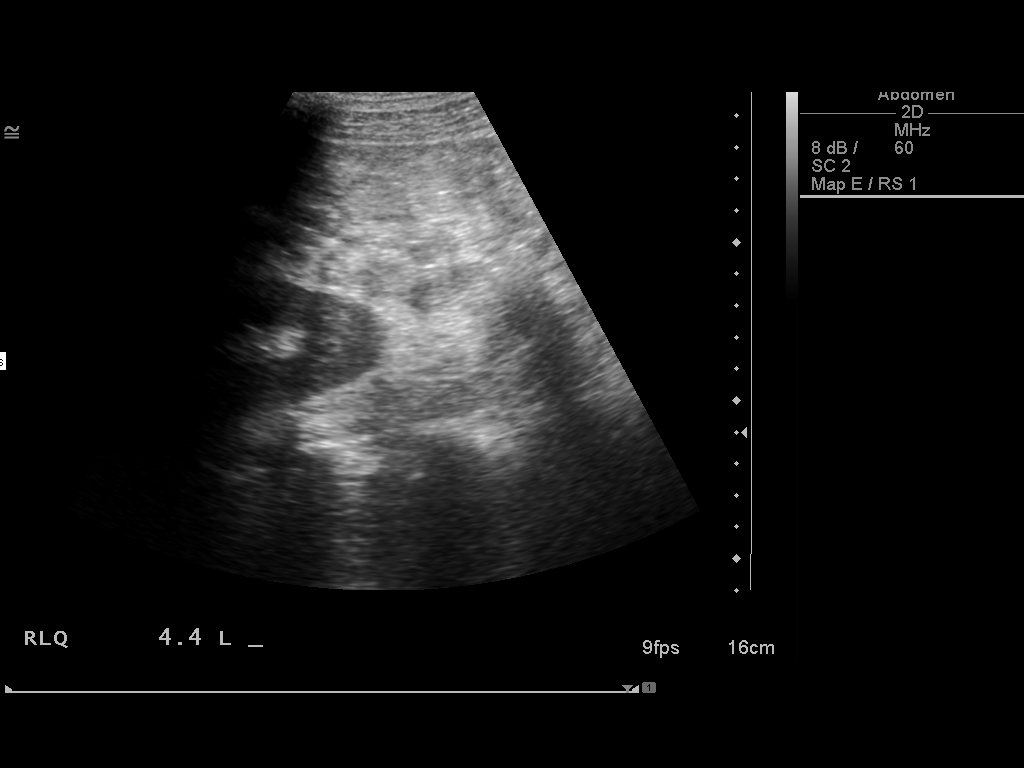

[6 of 6 positions shown; findings below may reference images not displayed]

An ultrasound guided paracentesis was thoroughly discussed with the
patient and questions answered.  The benefits, risks, alternatives
and complications were also discussed.  The patient understands and
wishes to proceed with the procedure.  Written consent was
obtained.

Ultrasound was performed to localize and mark an adequate pocket of
fluid in the right upper quadrant of the abdomen.  The area was
then prepped and draped in the normal sterile fashion.  1%
Lidocaine was used for local anesthesia.  Under ultrasound guidance
a 19 gauge Yueh catheter was introduced.  Paracentesis was
performed.  The catheter was removed and a dressing applied.

Complications:  None immediate
FINDINGS: A total of approximately 4.4 liters of yellow serous
fluid was removed.  A fluid sample was sent for laboratory
analysis.
IMPRESSION: Successful ultrasound guided paracentesis yielding 4.4 liters of
ascites.

Read by: Saliba, Drayton.-GLORIA

## 2014-02-28 ENCOUNTER — Inpatient Hospital Stay (HOSPITAL_COMMUNITY)
Admission: EM | Admit: 2014-02-28 | Discharge: 2014-03-02 | DRG: 638 | Disposition: A | Discharge status: Home / Self Care | Payer: Medicaid Other | Attending: Internal Medicine | Admitting: Internal Medicine

## 2014-02-28 ENCOUNTER — Encounter (HOSPITAL_COMMUNITY): Payer: Self-pay | Admitting: Emergency Medicine

## 2014-02-28 DIAGNOSIS — E872 Acidosis: Secondary | ICD-10-CM

## 2014-02-28 DIAGNOSIS — E871 Hypo-osmolality and hyponatremia: Secondary | ICD-10-CM | POA: Diagnosis present

## 2014-02-28 DIAGNOSIS — E8729 Other acidosis: Secondary | ICD-10-CM

## 2014-02-28 DIAGNOSIS — D649 Anemia, unspecified: Secondary | ICD-10-CM | POA: Diagnosis present

## 2014-02-28 DIAGNOSIS — E785 Hyperlipidemia, unspecified: Secondary | ICD-10-CM | POA: Diagnosis present

## 2014-02-28 DIAGNOSIS — E873 Alkalosis: Secondary | ICD-10-CM | POA: Diagnosis present

## 2014-02-28 DIAGNOSIS — K7031 Alcoholic cirrhosis of liver with ascites: Secondary | ICD-10-CM | POA: Diagnosis present

## 2014-02-28 DIAGNOSIS — E1310 Other specified diabetes mellitus with ketoacidosis without coma: Principal | ICD-10-CM | POA: Diagnosis present

## 2014-02-28 DIAGNOSIS — F17200 Nicotine dependence, unspecified, uncomplicated: Secondary | ICD-10-CM | POA: Diagnosis present

## 2014-02-28 DIAGNOSIS — E1110 Type 2 diabetes mellitus with ketoacidosis without coma: Secondary | ICD-10-CM | POA: Diagnosis present

## 2014-02-28 DIAGNOSIS — K7030 Alcoholic cirrhosis of liver without ascites: Secondary | ICD-10-CM | POA: Diagnosis present

## 2014-02-28 DIAGNOSIS — R188 Other ascites: Secondary | ICD-10-CM | POA: Diagnosis present

## 2014-02-28 DIAGNOSIS — I959 Hypotension, unspecified: Secondary | ICD-10-CM | POA: Diagnosis present

## 2014-02-28 DIAGNOSIS — F1020 Alcohol dependence, uncomplicated: Secondary | ICD-10-CM | POA: Diagnosis present

## 2014-02-28 HISTORY — DX: Unspecified cirrhosis of liver: K74.60

## 2014-02-28 LAB — BASIC METABOLIC PANEL
BUN: 15 mg/dL (ref 6–23)
BUN: 15 mg/dL (ref 6–23)
BUN: 13 mg/dL (ref 6–23)
BUN: 12 mg/dL (ref 6–23)
CALCIUM: 9.1 mg/dL (ref 8.4–10.5)
CALCIUM: 8.9 mg/dL (ref 8.4–10.5)
CHLORIDE: 101 meq/L (ref 96–112)
CO2: 22 meq/L (ref 19–32)
CO2: 22 mEq/L (ref 19–32)
CO2: 21 mEq/L (ref 19–32)
CO2: 21 mEq/L (ref 19–32)
CREATININE: 0.95 mg/dL (ref 0.50–1.35)
Calcium: 9.2 mg/dL (ref 8.4–10.5)
Calcium: 9 mg/dL (ref 8.4–10.5)
Chloride: 99 mEq/L (ref 96–112)
Chloride: 102 mEq/L (ref 96–112)
Chloride: 102 mEq/L (ref 96–112)
Creatinine, Ser: 0.89 mg/dL (ref 0.50–1.35)
Creatinine, Ser: 0.82 mg/dL (ref 0.50–1.35)
Creatinine, Ser: 0.82 mg/dL (ref 0.50–1.35)
GFR calc Af Amer: >90 mL/min (ref >90)
GFR calc Af Amer: >90 mL/min (ref >90)
GFR calc non Af Amer: >90 mL/min (ref >90)
GFR calc non Af Amer: >90 mL/min (ref >90)
GFR calc non Af Amer: >90 mL/min (ref >90)
GLUCOSE: 171 mg/dL — AB (ref 70–99)
Glucose, Bld: 209 mg/dL — ABNORMAL HIGH (ref 70–99)
Glucose, Bld: 104 mg/dL — ABNORMAL HIGH (ref 70–99)
Glucose, Bld: 132 mg/dL — ABNORMAL HIGH (ref 70–99)
POTASSIUM: 3.8 meq/L (ref 3.7–5.3)
Potassium: 3.8 mEq/L (ref 3.7–5.3)
Potassium: 3.6 mEq/L — ABNORMAL LOW (ref 3.7–5.3)
Potassium: 3.9 mEq/L (ref 3.7–5.3)
Sodium: 137 mEq/L (ref 137–147)
Sodium: 139 mEq/L (ref 137–147)
Sodium: 139 mEq/L (ref 137–147)
Sodium: 137 mEq/L (ref 137–147)

## 2014-02-28 LAB — URINE MICROSCOPIC-ADD ON

## 2014-02-28 LAB — COMPREHENSIVE METABOLIC PANEL
ALT: 40 U/L (ref 0–53)
AST: 33 U/L (ref 0–37)
Albumin: 4.7 g/dL (ref 3.5–5.2)
Alkaline Phosphatase: 236 U/L — ABNORMAL HIGH (ref 39–117)
BUN: 20 mg/dL (ref 6–23)
CALCIUM: 9.6 mg/dL (ref 8.4–10.5)
CO2: 22 mEq/L (ref 19–32)
Chloride: 86 mEq/L — ABNORMAL LOW (ref 96–112)
Creatinine, Ser: 1.15 mg/dL (ref 0.50–1.35)
GFR calc non Af Amer: 72 mL/min — ABNORMAL LOW (ref >90)
GFR, EST AFRICAN AMERICAN: 83 mL/min — AB (ref >90)
GLUCOSE: 584 mg/dL — AB (ref 70–99)
Potassium: 4.4 mEq/L (ref 3.7–5.3)
Sodium: 127 mEq/L — ABNORMAL LOW (ref 137–147)
Total Bilirubin: 0.4 mg/dL (ref 0.3–1.2)
Total Protein: 8.6 g/dL — ABNORMAL HIGH (ref 6.0–8.3)

## 2014-02-28 LAB — URINALYSIS, ROUTINE W REFLEX MICROSCOPIC
Bilirubin Urine: NEGATIVE
Glucose, UA: >1000 mg/dL — AB
Hgb urine dipstick: NEGATIVE
KETONES UR: 15 mg/dL — AB
LEUKOCYTES UA: NEGATIVE
Nitrite: NEGATIVE
PH: 5.5 (ref 5.0–8.0)
Protein, ur: NEGATIVE mg/dL
SPECIFIC GRAVITY, URINE: 1.035 — AB (ref 1.005–1.030)
UROBILINOGEN UA: 0.2 mg/dL (ref 0.0–1.0)

## 2014-02-28 LAB — GLUCOSE, CAPILLARY
GLUCOSE-CAPILLARY: 255 mg/dL — AB (ref 70–99)
Glucose-Capillary: 160 mg/dL — ABNORMAL HIGH (ref 70–99)
Glucose-Capillary: 91 mg/dL (ref 70–99)
Glucose-Capillary: 106 mg/dL — ABNORMAL HIGH (ref 70–99)
Glucose-Capillary: 108 mg/dL — ABNORMAL HIGH (ref 70–99)
Glucose-Capillary: 112 mg/dL — ABNORMAL HIGH (ref 70–99)

## 2014-02-28 LAB — I-STAT ARTERIAL BLOOD GAS, ED
ACID-BASE DEFICIT: 4 mmol/L — AB (ref 0.0–2.0)
Bicarbonate: 21.1 mEq/L (ref 20.0–24.0)
O2 Saturation: 98 %
PCO2 ART: 38.7 mmHg (ref 35.0–45.0)
PO2 ART: 115 mmHg — AB (ref 80.0–100.0)
Patient temperature: 37.6
TCO2: 22 mmol/L (ref 0–100)
pH, Arterial: 7.348 — ABNORMAL LOW (ref 7.350–7.450)

## 2014-02-28 LAB — CBC
HCT: 39 % (ref 39.0–52.0)
HEMATOCRIT: 36.7 % — AB (ref 39.0–52.0)
Hemoglobin: 14.3 g/dL (ref 13.0–17.0)
Hemoglobin: 13.3 g/dL (ref 13.0–17.0)
MCH: 31.2 pg (ref 26.0–34.0)
MCH: 30.9 pg (ref 26.0–34.0)
MCHC: 36.7 g/dL — ABNORMAL HIGH (ref 30.0–36.0)
MCHC: 36.2 g/dL — ABNORMAL HIGH (ref 30.0–36.0)
MCV: 85.2 fL (ref 78.0–100.0)
MCV: 85.3 fL (ref 78.0–100.0)
PLATELETS: 165 10*3/uL (ref 150–400)
Platelets: 200 10*3/uL (ref 150–400)
RBC: 4.58 MIL/uL (ref 4.22–5.81)
RBC: 4.3 MIL/uL (ref 4.22–5.81)
RDW: 12.6 % (ref 11.5–15.5)
RDW: 12.6 % (ref 11.5–15.5)
WBC: 6.9 10*3/uL (ref 4.0–10.5)
WBC: 8.1 10*3/uL (ref 4.0–10.5)

## 2014-02-28 LAB — MRSA PCR SCREENING: MRSA by PCR: NEGATIVE

## 2014-02-28 LAB — CBG MONITORING, ED
GLUCOSE-CAPILLARY: 539 mg/dL — AB (ref 70–99)
GLUCOSE-CAPILLARY: 408 mg/dL — AB (ref 70–99)

## 2014-02-28 LAB — HEMOGLOBIN A1C
HEMOGLOBIN A1C: 13.5 % — AB (ref <5.7)
Mean Plasma Glucose: 341 mg/dL — ABNORMAL HIGH (ref <117)

## 2014-02-28 MED ORDER — INSULIN ASPART 100 UNIT/ML ~~LOC~~ SOLN: 0.0000 [IU] | Freq: Every day | SUBCUTANEOUS | Status: DC

## 2014-02-28 MED ORDER — POTASSIUM CHLORIDE 10 MEQ/100ML IV SOLN
10.0000 meq | INTRAVENOUS | Status: AC
  Administered 2014-02-28 (×3): 10 meq via INTRAVENOUS
  Filled 2014-02-28 (×3): qty 100

## 2014-02-28 MED ORDER — BD GETTING STARTED TAKE HOME KIT: 3/10ML X 30G SYRINGES
1.0000 | Freq: Once | Status: AC
  Administered 2014-02-28: 1
  Filled 2014-02-28: qty 1

## 2014-02-28 MED ORDER — DEXTROSE-NACL 5-0.45 % IV SOLN: INTRAVENOUS | Status: DC

## 2014-02-28 MED ORDER — DEXTROSE-NACL 5-0.45 % IV SOLN
INTRAVENOUS | Status: DC
  Administered 2014-02-28: 75 mL/h via INTRAVENOUS
  Administered 2014-03-01: 06:00:00 via INTRAVENOUS

## 2014-02-28 MED ORDER — SODIUM CHLORIDE 0.9 % IV SOLN
INTRAVENOUS | Status: DC
  Filled 2014-02-28: qty 1

## 2014-02-28 MED ORDER — SODIUM CHLORIDE 0.9 % IV SOLN
INTRAVENOUS | Status: DC
Start: 2014-02-28 — End: 2014-03-02

## 2014-02-28 MED ORDER — LIVING WELL WITH DIABETES BOOK
Freq: Once | Status: AC
  Administered 2014-02-28: 19:00:00
  Filled 2014-02-28: qty 1

## 2014-02-28 MED ORDER — GABAPENTIN 300 MG PO CAPS
300.0000 mg | ORAL_CAPSULE | Freq: Every day | ORAL | Status: DC
  Administered 2014-02-28 – 2014-03-01 (×2): 300 mg via ORAL
  Filled 2014-02-28 (×3): qty 1

## 2014-02-28 MED ORDER — DEXTROSE 50 % IV SOLN: 25.0000 mL | INTRAVENOUS | Status: DC | PRN

## 2014-02-28 MED ORDER — SODIUM CHLORIDE 0.9 % IV BOLUS (SEPSIS)
1000.0000 mL | Freq: Once | INTRAVENOUS | Status: AC
  Administered 2014-02-28: 1000 mL via INTRAVENOUS

## 2014-02-28 MED ORDER — HEPARIN SODIUM (PORCINE) 5000 UNIT/ML IJ SOLN
5000.0000 [IU] | Freq: Three times a day (TID) | INTRAMUSCULAR | Status: DC
  Administered 2014-02-28 – 2014-03-02 (×5): 5000 [IU] via SUBCUTANEOUS
  Filled 2014-02-28 (×9): qty 1

## 2014-02-28 MED ORDER — INSULIN GLARGINE 100 UNIT/ML ~~LOC~~ SOLN
10.0000 [IU] | Freq: Every day | SUBCUTANEOUS | Status: DC
  Administered 2014-02-28 – 2014-03-01 (×2): 10 [IU] via SUBCUTANEOUS
  Filled 2014-02-28 (×3): qty 0.1

## 2014-02-28 MED ORDER — SODIUM CHLORIDE 0.9 % IV SOLN
INTRAVENOUS | Status: AC
  Administered 2014-02-28: 14:00:00 via INTRAVENOUS

## 2014-02-28 MED ORDER — SODIUM CHLORIDE 0.9 % IV SOLN: 1000.0000 mL | INTRAVENOUS | Status: DC

## 2014-02-28 MED ORDER — SODIUM CHLORIDE 0.9 % IV SOLN: INTRAVENOUS | Status: AC

## 2014-02-28 MED ORDER — POTASSIUM CHLORIDE 10 MEQ/100ML IV SOLN
10.0000 meq | Freq: Once | INTRAVENOUS | Status: AC
  Administered 2014-02-28: 10 meq via INTRAVENOUS
  Filled 2014-02-28: qty 100

## 2014-02-28 MED ORDER — SODIUM CHLORIDE 0.9 % IV SOLN
INTRAVENOUS | Status: DC
  Administered 2014-02-28: 3.5 [IU]/h via INTRAVENOUS
  Filled 2014-02-28: qty 1

## 2014-02-28 MED ORDER — INSULIN ASPART 100 UNIT/ML ~~LOC~~ SOLN: 0.0000 [IU] | Freq: Three times a day (TID) | SUBCUTANEOUS | Status: DC

## 2014-02-28 NOTE — Progress Notes (Signed)
Utilization Review Completed.  

## 2014-02-28 NOTE — ED Provider Notes (Signed)
Medical screening examination/treatment/procedure(s) were performed by non-physician practitioner and as supervising physician I was immediately available for consultation/collaboration.   EKG Interpretation None       Geoffery Lyonsouglas Rashaunda Rahl, MD 02/28/14 [phone]

## 2014-02-28 NOTE — Progress Notes (Signed)
Inpatient Diabetes Program Recommendations  AACE/ADA: New Consensus Statement on Inpatient Glycemic Control (2013)  Target Ranges:  Prepandial:   less than 140 mg/dL      Peak postprandial:   less than 180 mg/dL (1-2 hours)      Critically ill patients:  140 - 180 mg/dL   Reason for Assessment:  Referral received.  Patient newly diagnosed with diabetes today.  Currently on insulin drip.    Will order survival skill education by bedside RN when appropriate and discuss with RN. [facility].  Will need close follow-up with PCP.  Diabetes history: None Current orders for Inpatient glycemic control: IV insulin/DKA protocol  Will follow.  Beryl MeagerJenny Chaquita Basques, RN, BC-ADM Inpatient Diabetes Coordinator Pager [phone]

## 2014-02-28 NOTE — ED Notes (Signed)
Called report to [facility].

## 2014-02-28 NOTE — ED Provider Notes (Signed)
CSN: [id number]     Arrival date & time 02/28/14  1012 History   First MD Initiated Contact with Patient 02/28/14 1115     Chief Complaint  Patient presents with  . Hyperglycemia     (Consider location/radiation/quality/duration/timing/severity/associated sxs/prior Treatment) HPI  PCP: Sherlon Handing is a 53 y.o.male with a significant PMH of cirrhosis of the liver and previous alcohol dependency presents to the ER sent by his PCP with complaints of polyuria and increased thirst for 1 week. He reports that he no longer drinks alcohol but admits to eating a box of [employer] Debbies almost everyday for a while. He went to see his PCP and was told that his glucose was greater than 500 and recommended he come to the ER. He otherwise has been feeling okay. He denies having difficulty breathing, feeling hot, weak or tired. He is no acute distress, does not appear to be in distress with normal vital signs.   Past Medical History  Diagnosis Date  . ETOH abuse   . Cirrhosis    Past Surgical History  Procedure Laterality Date  . Tonsillectomy     History reviewed. No pertinent family history. History  Substance Use Topics  . Smoking status: Current Every Day Smoker -- 0.50 packs/day    Types: Cigarettes  . Smokeless tobacco: Not on file  . Alcohol Use: No    Review of Systems   Review of Systems  Gen: + weight loss (5-10 lbs), fevers, chills, night sweats  Eyes: no discharge or drainage, no occular pain or visual changes  Nose: no epistaxis or rhinorrhea  Mouth: no dental pain, no sore throat  Neck: no neck pain  Lungs:No wheezing, coughing or hemoptysis CV: no chest pain, palpitations, dependent edema or orthopnea  Abd: no abdominal pain, nausea, vomiting, diarrhea GU: no dysuria or gross hematuria,+ polyuria  MSK:  No muscle weakness or pain Neuro: no headache, no focal neurologic deficits  Skin: no rash or wounds Psyche: no complaints    Allergies  Review  of patient's allergies indicates no known allergies.  Home Medications   Current Outpatient Rx  Name  Route  Sig  Dispense  Refill  . furosemide (LASIX) 40 MG tablet   Oral   Take 1 tablet (40 mg total) by mouth daily.   30 tablet   1   . gabapentin (NEURONTIN) 300 MG capsule   Oral   Take 300 mg by mouth at bedtime.         . Multiple Vitamin (MULTIVITAMIN WITH MINERALS) TABS tablet   Oral   Take 1 tablet by mouth daily.         Marland Kitchen spironolactone (ALDACTONE) 50 MG tablet   Oral   Take 50 mg by mouth daily.          BP 104/76  Pulse 70  Temp(Src) 98 F (36.7 C) (Oral)  Resp 18  Ht 5\' 11"  (1.803 m)  Wt 153 lb (69.4 kg)  BMI 21.35 kg/m2  SpO2 99% Physical Exam  Nursing note and vitals reviewed. Constitutional: He appears well-developed and well-nourished. No distress.  HENT:  Head: Normocephalic and atraumatic.  Eyes: Pupils are equal, round, and reactive to light.  Neck: Normal range of motion. Neck supple.  Cardiovascular: Normal rate and regular rhythm.   Pulmonary/Chest: Effort normal.  Abdominal: Soft.  Neurological: He is alert.  Skin: Skin is warm and dry.    ED Course  Procedures (including critical care time)  Labs Review Labs Reviewed  CBC - Abnormal; Notable for the following:    MCHC 36.7 (*)    All other components within normal limits  COMPREHENSIVE METABOLIC PANEL - Abnormal; Notable for the following:    Sodium 127 (*)    Chloride 86 (*)    Glucose, Bld 584 (*)    Total Protein 8.6 (*)    Alkaline Phosphatase 236 (*)    GFR calc non Af Amer 72 (*)    GFR calc Af Amer 83 (*)    All other components within normal limits  URINALYSIS, ROUTINE W REFLEX MICROSCOPIC - Abnormal; Notable for the following:    Specific Gravity, Urine 1.035 (*)    Glucose, UA >1000 (*)    Ketones, ur 15 (*)    All other components within normal limits  CBG MONITORING, ED - Abnormal; Notable for the following:    Glucose-Capillary 539 (*)    All other  components within normal limits  I-STAT ARTERIAL BLOOD GAS, ED - Abnormal; Notable for the following:    pH, Arterial 7.348 (*)    pO2, Arterial 115.0 (*)    Acid-base deficit 4.0 (*)    All other components within normal limits  URINE MICROSCOPIC-ADD ON  KETONES, URINE   Imaging Review No results found.   EKG Interpretation None      MDM   Final diagnoses:  Diabetic acidosis  Metabolic acidosis, increased anion gap    Patient with onset diabetes - does not appear to be in any distress. Will start on glucostabilizer and order lab work-up. Will most likely require admission.  ABG shows- Primary metabolic acidosis, with increased anion gap (gap of 20) with superimposed respiratory alkalosis  CRITICAL CARE Performed by: Dorthula Matasiffany G Sunni Richardson Total critical care time: 30 Critical care time was exclusive of separately billable procedures and treating other patients. Critical care was necessary to treat or prevent imminent or life-threatening deterioration. Critical care was time spent personally by me on the following activities: development of treatment plan with patient and/or surrogate as well as nursing, discussions with consultants, evaluation of patient's response to treatment, examination of patient, obtaining history from patient or surrogate, ordering and performing treatments and interventions, ordering and review of laboratory studies, ordering and review of radiographic studies, pulse oximetry and re-evaluation of patient's condition.  Patient admitted to [facility], inpatient, stepdown, [facility]   Dorthula Matasiffany G Tattiana Fakhouri, PA-C 02/28/14 1308

## 2014-02-28 NOTE — H&P (Signed)
[facility] History and Physical  Patrick [name] ZOX:[id number] DOB: 1961/03/13 DOA: 02/28/2014  Referring physician:  PCP: No primary provider on file.   Chief Complaint: Polyuria, polydipsia  HPI: Patrick [name] is a 53 y.o. male with a past medical history of cirrhosis, alcohol abuse, presenting to the emergency room with complaints of polyuria polydipsia. Patient's had originally presented to his primary care physician's office with complaints of one week history of polyuria and increased thirst. Patient reported drinking large quantities of water, denying recent alcohol use. At his primary care physician's office he was found to have a blood sugar greater than 500 and referred to the emergency room. On presentation he was found to have a blood sugar of 539, with BMP showing an anion gap of 22. patient was started on IV insulin. he denies nausea, vomiting, fevers, chills, abdominal pain, dysuria, hematuria, diarrhea, constipation, bloody stools.                                                                                                                                                                                    Review of Systems:  Constitutional:  No weight loss, night sweats, Fevers, chills, fatigue.  HEENT:  No headaches, Difficulty swallowing,Tooth/dental problems,Sore throat,  No sneezing, itching, ear ache, nasal congestion, post nasal drip,  Cardio-vascular:  No chest pain, Orthopnea, PND, swelling in lower extremities, anasarca, dizziness, palpitations  GI:  No heartburn, indigestion, abdominal pain, nausea, vomiting, diarrhea, change in bowel habits, loss of appetite  Resp:  No shortness of breath with exertion or at rest. No excess mucus, no productive cough, No non-productive cough, No coughing up of blood.No change in color of mucus.No wheezing.No chest wall deformity  Skin:  no rash or lesions.  GU:  no dysuria, change in color of urine, No flank pain.  Positive for increasing urinary frequency Musculoskeletal:  No joint pain or swelling. No decreased range of motion. No back pain.  Psych:  No change in mood or affect. No depression or anxiety. No memory loss.   Past Medical History  Diagnosis Date  . ETOH abuse   . Cirrhosis    Past Surgical History  Procedure Laterality Date  . Tonsillectomy     Social History:  reports that he has been smoking Cigarettes.  He has been smoking about 0.50 packs per day. He does not have any smokeless tobacco history on file. He reports that he does not drink alcohol or use illicit drugs.  No Known Allergies  History reviewed. No pertinent family history.   Prior to Admission medications   Medication Sig Start Date End Date Taking? Authorizing Provider  furosemide (LASIX) 40 MG  tablet Take 1 tablet (40 mg total) by mouth daily. 08/24/12  Yes Marianne L York, PA-C  gabapentin (NEURONTIN) 300 MG capsule Take 300 mg by mouth at bedtime.   Yes Historical Provider, MD  Multiple Vitamin (MULTIVITAMIN WITH MINERALS) TABS tablet Take 1 tablet by mouth daily.   Yes Historical Provider, MD  spironolactone (ALDACTONE) 50 MG tablet Take 50 mg by mouth daily.   Yes Historical Provider, MD   Physical Exam: Filed Vitals:   02/28/14 1345  BP: 104/75  Pulse: 67  Temp:   Resp:     BP 104/75  Pulse 67  Temp(Src) 98 F (36.7 C) (Oral)  Resp 18  Ht 5\' 11"  (1.803 m)  Wt 69.4 kg (153 lb)  BMI 21.35 kg/m2  SpO2 100%  General:  Appears calm and comfortable, no acute distress Eyes: PERRL, normal lids, irises & conjunctiva ENT: grossly normal hearing, lips & tongue Neck: no LAD, masses or thyromegaly Cardiovascular: RRR, no m/r/g. No LE edema. Telemetry: SR, no arrhythmias  Respiratory: CTA bilaterally, no w/r/r. Normal respiratory effort. Abdomen: soft, ntnd Skin: no rash or induration seen on limited exam Musculoskeletal: grossly normal tone BUE/BLE Psychiatric: grossly normal mood and affect, speech  fluent and appropriate Neurologic: grossly non-focal.          Labs on Admission:  Basic Metabolic Panel:  Recent Labs Lab 02/28/14 1034  NA 127*  K 4.4  CL 86*  CO2 22  GLUCOSE 584*  BUN 20  CREATININE 1.15  CALCIUM 9.6   Liver Function Tests:  Recent Labs Lab 02/28/14 1034  AST 33  ALT 40  ALKPHOS 236*  BILITOT 0.4  PROT 8.6*  ALBUMIN 4.7   No results found for this basename: LIPASE, AMYLASE,  in the last 168 hours No results found for this basename: AMMONIA,  in the last 168 hours CBC:  Recent Labs Lab 02/28/14 1034  WBC 6.9  HGB 14.3  HCT 39.0  MCV 85.2  PLT 200   Cardiac Enzymes: No results found for this basename: CKTOTAL, CKMB, CKMBINDEX, TROPONINI,  in the last 168 hours  BNP (last 3 results) No results found for this basename: PROBNP,  in the last 8760 hours CBG:  Recent Labs Lab 02/28/14 1034 02/28/14 1317  GLUCAP 539* 408*    Radiological Exams on Admission: No results found.  EKG: Independently reviewed.   Assessment/Plan Principal Problem:   DKA (diabetic ketoacidoses) Active Problems:   Hyponatremia   1. Diabetic ketoacidosis. Patient presenting with complaints of polyuria polydipsia, found to have a blood sugar of 584 on lab work, with an anion gap of 19 and presence of urine ketones. Had not previously been diagnosed with diabetes mellitus. Will place patient on DKA protocol, admitted to the step down unit, continue his IV insulin, IV fluid resuscitation, correct potassium levels, monitor serial BMPs, check a hemoglobin A1c, keep patient n.p.o for now. Diabetic cornea consults for diabetic teaching.  2. Hyponatremia. Labs showed sodium of 127, likely secondary to dilutional affect of hyperosmolality in setting of DKA. 3. History of alcoholic cirrhosis. Currently stable, patient denies drinking alcohol recently. 4. DVT prophylaxis. Heparin subcutaneous    Code Status: Full code Family Communication: Family members not  present at at bedside Disposition Plan: I anticipate patient will require greater than 2 nights hospitalization  Time spent: 65 min  Jeralyn BennettEzequiel Hakan Nudelman [facility] Pager [phone]

## 2014-02-28 NOTE — ED Notes (Signed)
He states he went to the doctor for frequent urination and they told him his blood sugar was high. Denies pain

## 2014-02-28 NOTE — ED Notes (Signed)
MD at Bedside.

## 2014-02-28 NOTE — Progress Notes (Signed)
Inpatient Diabetes Program Recommendations  AACE/ADA: New Consensus Statement on Inpatient Glycemic Control (2013)  Target Ranges:  Prepandial:   less than 140 mg/dL      Peak postprandial:   less than 180 mg/dL (1-2 hours)      Critically ill patients:  140 - 180 mg/dL   Reason for Visit: Diabetes Coordinator consult  Diabetes history: None Outpatient Diabetes medications: None Current orders for Inpatient glycemic control: IV insulin glucostabilizer   Note: Spoke with patient about the new onset diagnosis of diabetes. States that he has never been told that he has hyperglycemia.  No diabetes in the family.  Has been [facility] over that past 2 years. Tried to get blood work done there 2 weeks ago.  Started having frequent thirst and urination. He knew that something was not right.  Explained to him that he is on IV insulin and then will be changed to SQ insulin when blood sugars are within range.  Will be receiving information and education about diabetes.  Will continue to follow while in hospital.  Smith MinceKendra Marine Lezotte RN BSN CDE

## 2014-02-28 NOTE — ED Notes (Signed)
PA at bedside.

## 2014-02-28 NOTE — ED Notes (Signed)
Family at bedside. 

## 2014-02-28 NOTE — ED Notes (Signed)
CBG Taken = 408

## 2014-02-28 NOTE — Progress Notes (Signed)
02/28/2014 Patient transfer from the emergency room to 2C03 at 1507. He is alert, oriented and ambulatory. Patient was placed on th glucose stabilzer because of increase blood sugar. Patient skin is dry, had a little discoloration on lower legs. He was placed on telemetry when arrived on unit. [facility].

## 2014-03-01 DIAGNOSIS — D649 Anemia, unspecified: Secondary | ICD-10-CM

## 2014-03-01 DIAGNOSIS — E872 Acidosis, unspecified: Secondary | ICD-10-CM

## 2014-03-01 DIAGNOSIS — I959 Hypotension, unspecified: Secondary | ICD-10-CM | POA: Diagnosis present

## 2014-03-01 LAB — CBC WITH DIFFERENTIAL/PLATELET
BASOS ABS: 0.1 10*3/uL (ref 0.0–0.1)
BASOS PCT: 1 % (ref 0–1)
Eosinophils Absolute: 0.2 10*3/uL (ref 0.0–0.7)
Eosinophils Relative: 3 % (ref 0–5)
HEMATOCRIT: 38.2 % — AB (ref 39.0–52.0)
Hemoglobin: 14.1 g/dL (ref 13.0–17.0)
Lymphocytes Relative: 52 % — ABNORMAL HIGH (ref 12–46)
Lymphs Abs: 3.6 10*3/uL (ref 0.7–4.0)
MCH: 31.7 pg (ref 26.0–34.0)
MCHC: 36.7 g/dL — AB (ref 30.0–36.0)
MCV: 85.5 fL (ref 78.0–100.0)
Monocytes Absolute: 0.4 10*3/uL (ref 0.1–1.0)
Monocytes Relative: 6 % (ref 3–12)
NEUTROS ABS: 2.6 10*3/uL (ref 1.7–7.7)
Neutrophils Relative %: 38 % — ABNORMAL LOW (ref 43–77)
PLATELETS: 183 10*3/uL (ref 150–400)
RBC: 4.47 MIL/uL (ref 4.22–5.81)
RDW: 13 % (ref 11.5–15.5)
WBC: 6.8 10*3/uL (ref 4.0–10.5)

## 2014-03-01 LAB — GLUCOSE, CAPILLARY
GLUCOSE-CAPILLARY: 128 mg/dL — AB (ref 70–99)
GLUCOSE-CAPILLARY: 131 mg/dL — AB (ref 70–99)
GLUCOSE-CAPILLARY: 134 mg/dL — AB (ref 70–99)
GLUCOSE-CAPILLARY: 134 mg/dL — AB (ref 70–99)
GLUCOSE-CAPILLARY: 135 mg/dL — AB (ref 70–99)
GLUCOSE-CAPILLARY: 155 mg/dL — AB (ref 70–99)
GLUCOSE-CAPILLARY: 257 mg/dL — AB (ref 70–99)
Glucose-Capillary: 143 mg/dL — ABNORMAL HIGH (ref 70–99)
Glucose-Capillary: 154 mg/dL — ABNORMAL HIGH (ref 70–99)
Glucose-Capillary: 167 mg/dL — ABNORMAL HIGH (ref 70–99)
Glucose-Capillary: 172 mg/dL — ABNORMAL HIGH (ref 70–99)
Glucose-Capillary: 178 mg/dL — ABNORMAL HIGH (ref 70–99)
Glucose-Capillary: 185 mg/dL — ABNORMAL HIGH (ref 70–99)

## 2014-03-01 LAB — COMPREHENSIVE METABOLIC PANEL
ALK PHOS: 138 U/L — AB (ref 39–117)
ALT: 30 U/L (ref 0–53)
AST: 28 U/L (ref 0–37)
Albumin: 4.1 g/dL (ref 3.5–5.2)
BILIRUBIN TOTAL: 0.8 mg/dL (ref 0.3–1.2)
BUN: 9 mg/dL (ref 6–23)
CO2: 20 meq/L (ref 19–32)
Calcium: 9 mg/dL (ref 8.4–10.5)
Chloride: 97 mEq/L (ref 96–112)
Creatinine, Ser: 0.85 mg/dL (ref 0.50–1.35)
Glucose, Bld: 252 mg/dL — ABNORMAL HIGH (ref 70–99)
POTASSIUM: 4.2 meq/L (ref 3.7–5.3)
Sodium: 133 mEq/L — ABNORMAL LOW (ref 137–147)
TOTAL PROTEIN: 7.4 g/dL (ref 6.0–8.3)

## 2014-03-01 LAB — LIPID PANEL
Cholesterol: 155 mg/dL (ref 0–200)
HDL: 30 mg/dL — ABNORMAL LOW (ref >39)
LDL Cholesterol: 105 mg/dL — ABNORMAL HIGH (ref 0–99)
TRIGLYCERIDES: 101 mg/dL (ref <150)
Total CHOL/HDL Ratio: 5.2 RATIO
VLDL: 20 mg/dL (ref 0–40)

## 2014-03-01 LAB — MAGNESIUM: Magnesium: 1.5 mg/dL (ref 1.5–2.5)

## 2014-03-01 LAB — CBC
HEMATOCRIT: 33.4 % — AB (ref 39.0–52.0)
Hemoglobin: 12.1 g/dL — ABNORMAL LOW (ref 13.0–17.0)
MCH: 30.7 pg (ref 26.0–34.0)
MCHC: 36.2 g/dL — AB (ref 30.0–36.0)
MCV: 84.8 fL (ref 78.0–100.0)
Platelets: 168 10*3/uL (ref 150–400)
RBC: 3.94 MIL/uL — ABNORMAL LOW (ref 4.22–5.81)
RDW: 12.5 % (ref 11.5–15.5)
WBC: 9.1 10*3/uL (ref 4.0–10.5)

## 2014-03-01 LAB — BASIC METABOLIC PANEL
BUN: 11 mg/dL (ref 6–23)
BUN: 9 mg/dL (ref 6–23)
CHLORIDE: 100 meq/L (ref 96–112)
CO2: 21 mEq/L (ref 19–32)
CO2: 21 meq/L (ref 19–32)
CREATININE: 0.8 mg/dL (ref 0.50–1.35)
Calcium: 8.6 mg/dL (ref 8.4–10.5)
Calcium: 8.7 mg/dL (ref 8.4–10.5)
Chloride: 100 mEq/L (ref 96–112)
Creatinine, Ser: 0.82 mg/dL (ref 0.50–1.35)
GFR calc Af Amer: >90 mL/min (ref >90)
GFR calc Af Amer: >90 mL/min (ref >90)
GFR calc non Af Amer: >90 mL/min (ref >90)
Glucose, Bld: 150 mg/dL — ABNORMAL HIGH (ref 70–99)
Glucose, Bld: 132 mg/dL — ABNORMAL HIGH (ref 70–99)
Potassium: 3.7 mEq/L (ref 3.7–5.3)
Potassium: 3.7 mEq/L (ref 3.7–5.3)
Sodium: 135 mEq/L — ABNORMAL LOW (ref 137–147)
Sodium: 134 mEq/L — ABNORMAL LOW (ref 137–147)

## 2014-03-01 MED ORDER — INSULIN ASPART 100 UNIT/ML ~~LOC~~ SOLN
0.0000 [IU] | SUBCUTANEOUS | Status: DC
  Administered 2014-03-01: 5 [IU] via SUBCUTANEOUS
  Administered 2014-03-01: 2 [IU] via SUBCUTANEOUS
  Administered 2014-03-01: 3 [IU] via SUBCUTANEOUS
  Administered 2014-03-02: 5 [IU] via SUBCUTANEOUS
  Administered 2014-03-02: 2 [IU] via SUBCUTANEOUS

## 2014-03-01 NOTE — Plan of Care (Addendum)
Problem: Food- and Nutrition-Related Knowledge Deficit (NB-1.1) Goal: Nutrition education Formal process to instruct or train a patient/client in a skill or to impart knowledge to help patients/clients voluntarily manage or modify food choices and eating behavior to maintain or improve health. Outcome: Completed/Met Date Met:  03/01/14  RD consulted for nutrition education regarding diabetes. PTA pt drinking sweet tea and juices. Pt only eating 2 meals per day.   Per pt he lost a couple of pounds PTA due to poorly controlled DM.  Appetite good.   Lab Results  Component Value Date    HGBA1C 13.5* 02/28/2014    RD provided "Carbohydrate Counting for People with Diabetes" handout from the Academy of Nutrition and Dietetics. Discussed different food groups and their effects on blood sugar, emphasizing carbohydrate-containing foods. Provided list of carbohydrates and recommended serving sizes of common foods.  Discussed importance of controlled and consistent carbohydrate intake throughout the day. Provided examples of ways to balance meals/snacks and encouraged intake of high-fiber, whole grain complex carbohydrates. Teach back method used.  Expect good compliance.  Body mass index is 21.1 kg/(m^2). BMI WNL  Current diet order is CHO Modified, patient is consuming approximately 100% of meals at this time. Labs and medications reviewed. No further nutrition interventions warranted at this time. RD contact information provided. If additional nutrition issues arise, please re-consult RD.  [place], [place], [place] Pager [phone] After Hours Pager

## 2014-03-01 NOTE — Progress Notes (Signed)
STEP DOWN [facility] PROGRESS NOTE  Patrick [name] JKK:[id number] DOB: 1961/10/23 DOA: 02/28/2014 PCP: No primary provider on file.       Principal Problem:   DKA (diabetic ketoacidoses) Active Problems:   Alcoholic cirrhosis of liver with ascites   Anemia   Hyponatremia      VITAL SIGNS:  Temp: Pulse Rate:  Resp: BP: SpO2: FiO2 (%):   Ventilator settings  Mode  Rate  Tidal Volume  FiO2  PO2/ FIO2  PIP  Plateau      Assessment/Plan: Neuro  Resp  CVS  Hypotension -Patient states always runs low BP; currently asymptomatic -Obtain orthostatic vitals every shift  GI  Renal balance today; inaccurate        /overall; inaccurate     Creatinine ;   0.85     Hourly output     Endocrine Diabetes type 2/DKA -DKA has resolved (AG = 14 only slightly high) -Continue patient on Lantus 10 units daily -Start moderate SSI -02/28/2014 hemoglobin A1c= 13.5 -Obtain lipid panel  Hyponatremia -Most likely secondary to patient's continued dehydration secondary to DKA should correct with fluid resuscitation -continue normal saline 162ml/hr  Extremeties  Heme/labs   ID    Code Status: Full Family Communication: None Disposition Plan: Resolution of DKA    Devices   LINES / TUBES:        Consultants:    Procedures/SIGNIFICANT EVENTS:       CULTURES:  02/28/2014 MRSA by PCR negative    Antibiotics:      Continuous Infusions: . sodium chloride    . insulin (NOVOLIN-R) infusion Stopped (03/01/14 0631)      HPI/Subjective: Patrick [name] is a 53 y.o. BM PMHx alcoholic Liver cirrhosis, alcohol abuse, anemia, presenting to the emergency room with complaints of polyuria polydipsia. Patient's had originally presented to his primary care physician's office with complaints of one week history of polyuria and increased thirst. Patient reported drinking large quantities of water, denying recent alcohol use. At his primary care  physician's office he was found to have a blood sugar greater than 500 and referred to the emergency room. On presentation he was found to have a blood sugar of 539, with BMP showing an anion gap of 22. patient was started on IV insulin. he denies nausea, vomiting, fevers, chills, abdominal pain, dysuria, hematuria, diarrhea, constipation, bloody stools.  4/11 negative CP, negative SOB, negative N./V., negative, abdominal pain   Exam:   General:  A./O. x4, NAD  Cardiovascular: Regular rhythm and rate, negative murmurs rubs or gallops, DP/PT pulse plus one bilateral  Respiratory: Clear to auscultation bilateral  Abdomen: Soft, nontender, nondistended, plus bowel sound  Musculoskeletal: Negative edema      Data Reviewed: Basic Metabolic Panel:  Recent Labs Lab 02/28/14 1839 02/28/14 2030 03/01/14 0147 03/01/14 0805 03/01/14 1155  NA 139 137 135* 134* 133*  K 3.8 3.9 3.7 3.7 4.2  CL 102 101 100 100 97  CO2 21 21 21 21 20   GLUCOSE 104* 132* 150* 132* 252*  BUN 13 12 11 9 9   CREATININE 0.82 0.82 0.80 0.82 0.85  CALCIUM 9.0 8.9 8.6 8.7 9.0  MG  --   --   --   --  1.5   Liver Function Tests:  Recent Labs Lab 02/28/14 1034 03/01/14 1155  AST 33 28  ALT 40 30  ALKPHOS 236* 138*  BILITOT 0.4 0.8  PROT 8.6* 7.4  ALBUMIN 4.7 4.1   No results found for this  basename: LIPASE, AMYLASE,  in the last 168 hours No results found for this basename: AMMONIA,  in the last 168 hours CBC:  Recent Labs Lab 02/28/14 1034 02/28/14 1440 03/01/14 0147 03/01/14 1155  WBC 6.9 8.1 9.1 6.8  NEUTROABS  --   --   --  2.6  HGB 14.3 13.3 12.1* 14.1  HCT 39.0 36.7* 33.4* 38.2*  MCV 85.2 85.3 84.8 85.5  PLT 200 165 168 183   Cardiac Enzymes: No results found for this basename: CKTOTAL, CKMB, CKMBINDEX, TROPONINI,  in the last 168 hours BNP (last 3 results) No results found for this basename: PROBNP,  in the last 8760 hours CBG:  Recent Labs Lab 03/01/14 0630 03/01/14 0740  03/01/14 0852 03/01/14 1244 03/01/14 1658  GLUCAP 128* 135* 134* 257* 172*    Recent Results (from the past 240 hour(s))  MRSA PCR SCREENING     Status: None   Collection Time    02/28/14  3:38 PM      Result Value Ref Range Status   MRSA by PCR NEGATIVE  NEGATIVE Final   Comment:            The GeneXpert MRSA Assay (FDA     approved for NASAL specimens     only), is one component of a     comprehensive MRSA colonization     surveillance program. It is not     intended to diagnose MRSA     infection nor to guide or     monitor treatment for     MRSA infections.     No results found.  Scheduled Meds: . gabapentin  300 mg Oral QHS  . heparin  5,000 Units Subcutaneous 3 times per day  . insulin aspart  0-15 Units Subcutaneous 6 times per day  . insulin glargine  10 Units Subcutaneous QHS        Time spent: 40 minutes   Drema Dallasurtis J Jaxxen Voong  [facility] Pager [phone]. If 7PM-7AM, please contact night-coverage at [email or web address], password [email or web address] 03/01/2014, 8:58 PM  LOS: 1 day

## 2014-03-02 DIAGNOSIS — I959 Hypotension, unspecified: Secondary | ICD-10-CM

## 2014-03-02 DIAGNOSIS — R188 Other ascites: Secondary | ICD-10-CM

## 2014-03-02 LAB — CBC WITH DIFFERENTIAL/PLATELET
BASOS PCT: 0 % (ref 0–1)
Basophils Absolute: 0 10*3/uL (ref 0.0–0.1)
Eosinophils Absolute: 0.2 10*3/uL (ref 0.0–0.7)
Eosinophils Relative: 3 % (ref 0–5)
HEMATOCRIT: 35.4 % — AB (ref 39.0–52.0)
HEMOGLOBIN: 12.7 g/dL — AB (ref 13.0–17.0)
LYMPHS ABS: 4.6 10*3/uL — AB (ref 0.7–4.0)
Lymphocytes Relative: 63 % — ABNORMAL HIGH (ref 12–46)
MCH: 30.8 pg (ref 26.0–34.0)
MCHC: 35.9 g/dL (ref 30.0–36.0)
MCV: 85.7 fL (ref 78.0–100.0)
MONO ABS: 0.5 10*3/uL (ref 0.1–1.0)
MONOS PCT: 7 % (ref 3–12)
NEUTROS ABS: 2 10*3/uL (ref 1.7–7.7)
NEUTROS PCT: 27 % — AB (ref 43–77)
Platelets: 180 10*3/uL (ref 150–400)
RBC: 4.13 MIL/uL — AB (ref 4.22–5.81)
RDW: 12.9 % (ref 11.5–15.5)
WBC: 7.3 10*3/uL (ref 4.0–10.5)

## 2014-03-02 LAB — GLUCOSE, CAPILLARY
GLUCOSE-CAPILLARY: 228 mg/dL — AB (ref 70–99)
GLUCOSE-CAPILLARY: 172 mg/dL — AB (ref 70–99)
Glucose-Capillary: 131 mg/dL — ABNORMAL HIGH (ref 70–99)
Glucose-Capillary: 133 mg/dL — ABNORMAL HIGH (ref 70–99)
Glucose-Capillary: 89 mg/dL (ref 70–99)
Glucose-Capillary: 90 mg/dL (ref 70–99)

## 2014-03-02 LAB — COMPREHENSIVE METABOLIC PANEL
ALT: 28 U/L (ref 0–53)
AST: 31 U/L (ref 0–37)
Albumin: 3.5 g/dL (ref 3.5–5.2)
Alkaline Phosphatase: 119 U/L — ABNORMAL HIGH (ref 39–117)
BUN: 11 mg/dL (ref 6–23)
CALCIUM: 9 mg/dL (ref 8.4–10.5)
CO2: 19 mEq/L (ref 19–32)
CREATININE: 0.91 mg/dL (ref 0.50–1.35)
Chloride: 103 mEq/L (ref 96–112)
Glucose, Bld: 83 mg/dL (ref 70–99)
Potassium: 3.4 mEq/L — ABNORMAL LOW (ref 3.7–5.3)
Sodium: 138 mEq/L (ref 137–147)
Total Bilirubin: 0.3 mg/dL (ref 0.3–1.2)
Total Protein: 6.8 g/dL (ref 6.0–8.3)

## 2014-03-02 LAB — MAGNESIUM: Magnesium: 1.5 mg/dL (ref 1.5–2.5)

## 2014-03-02 MED ORDER — INSULIN ASPART 100 UNIT/ML FLEXPEN: 5.0000 [IU] | PEN_INJECTOR | Freq: Three times a day (TID) | SUBCUTANEOUS | Status: DC

## 2014-03-02 MED ORDER — INSULIN GLARGINE 100 UNIT/ML ~~LOC~~ SOLN: 10.0000 [IU] | Freq: Every day | SUBCUTANEOUS | Status: DC

## 2014-03-02 MED ORDER — ATORVASTATIN CALCIUM 20 MG PO TABS
20.0000 mg | ORAL_TABLET | Freq: Every day | ORAL | Status: DC
  Filled 2014-03-02: qty 1

## 2014-03-02 MED ORDER — ATORVASTATIN CALCIUM 20 MG PO TABS: 20.0000 mg | ORAL_TABLET | Freq: Every day | ORAL | Status: DC

## 2014-03-02 NOTE — Progress Notes (Signed)
Per MD Joseph ArtWoods, I called in 31 gauge x 3/16 medium size purple needs for pt's Novolog flexpen to [facility] at [phone], Spoke with Kathlene NovemberMike (Pharmacists).

## 2014-03-02 NOTE — Discharge Summary (Addendum)
Physician Discharge Summary  Patrick [name] ZOX:[id number] DOB: 28-Apr-1961 DOA: 02/28/2014  PCP: No primary provider on file.  Admit date: 02/28/2014 Discharge date: 03/02/2014  Time spent: 40 minutes  Recommendations for Outpatient Follow-up:  Hypotension  -Patient states always runs low BP; and is not orthostatic, or symptomatic  -Patient stable for discharge, PCP to monitor patient's BP in case he becomes symptomatic.   Diabetes type 2/DKA/uncontrolled  -DKA has resolved  -Patient has received appropriate diabetic teaching. -Patient instructed to check his FSBS prior to each meal .[name] bedtime -Patient counseled to maintain food logbook -Will be discharged on Lantus 10 units daily  -Discharge on NovoLog 5 units subcutaneous prior to meals  - Counseled patient and wife that on 02/28/2014 hemoglobin A1c= 13.5. Patient's hemoglobin A1c goal is<7 -Followup with PCP for titration of diabetic medication  HLD  -Constipation his LDL = 105 mg/dL.  -Started on Lipitor 20 mg daily  -Goal LDL of < 70 mg/dL  Hyponatremia  -Resolved  Alcoholic cirrhosis of liver with ascites -Resume Lasix 40 mg daily  -Resume spiral-like tone 50 mg daily  -Followup with PCP/hepatologist in order to follow closely electrolytes       Discharge Diagnoses:  Principal Problem:   DKA (diabetic ketoacidoses) Active Problems:   Alcoholic cirrhosis of liver with ascites   Anemia   Hyponatremia   Hypotension, unspecified   Discharge Condition: stable  Diet recommendation: Diabetic   Filed Weights   02/28/14 1023 02/28/14 1345 02/28/14 1624  Weight: 69.4 kg (153 lb) 68.584 kg (151 lb 3.2 oz) 68.584 kg (151 lb 3.2 oz)    History of present illness:  Patrick [name] is a 53 y.o. BM PMHx alcoholic Liver cirrhosis, alcohol abuse, anemia, presenting to the emergency room with complaints of polyuria polydipsia. Patient's had originally presented to his primary care physician's office with complaints of  one week history of polyuria and increased thirst. Patient reported drinking large quantities of water, denying recent alcohol use. At his primary care physician's office he was found to have a blood sugar greater than 500 and referred to the emergency room. On presentation he was found to have a blood sugar of 539, with BMP showing an anion gap of 22. patient was started on IV insulin. he denies nausea, vomiting, fevers, chills, abdominal pain, dysuria, hematuria, diarrhea, constipation, bloody stools.  4/11 negative CP, negative SOB, negative N./V., negative, abdominal pain. 4/12 patient's CBG over the past 24 hours on a home regimen of insulin has been better controlled. Patient has received diabetic teaching and feels he is ready for discharge    Procedures/SIGNIFICANT EVENTS:    CULTURES:  02/28/2014 MRSA by PCR negative      Antibiotics:    Discharge Exam: Filed Vitals:   03/02/14 0705 03/02/14 0715 03/02/14 0800 03/02/14 1205  BP: 105/83 107/74  101/74  Pulse:    80  Temp:   97.8 F (36.6 C) 97.7 F (36.5 C)  TempSrc:   Oral Oral  Resp:      Height:      Weight:      SpO2:       General: A./O. x4, NAD  Cardiovascular: Regular rhythm and rate, negative murmurs rubs or gallops, DP/PT pulse plus one bilateral  Respiratory: Clear to auscultation bilateral  Abdomen: Soft, nontender, nondistended, plus bowel sound  Musculoskeletal: Negative edema   Discharge Instructions     Medication List         atorvastatin 20 MG tablet  Commonly  known as:  LIPITOR  Take 1 tablet (20 mg total) by mouth daily at 6 PM.     furosemide 40 MG tablet  Commonly known as:  LASIX  Take 1 tablet (40 mg total) by mouth daily.     gabapentin 300 MG capsule  Commonly known as:  NEURONTIN  Take 300 mg by mouth at bedtime.     insulin aspart 100 UNIT/ML FlexPen  Commonly known as:  NOVOLOG FLEXPEN  Inject 5 Units into the skin 3 (three) times daily with meals.     insulin glargine 100  UNIT/ML injection  Commonly known as:  LANTUS  Inject 0.1 mLs (10 Units total) into the skin at bedtime.     multivitamin with minerals Tabs tablet  Take 1 tablet by mouth daily.     spironolactone 50 MG tablet  Commonly known as:  ALDACTONE  Take 50 mg by mouth daily.       No Known Allergies Follow-up Information   Follow up with Melvia Heapsobert Kaplan, MD. Schedule an appointment as soon as possible for a visit in 1 week. [facility](Hospital followup; last seen by you in 2007; reestablish care for alcoholic cirrhosis of liver with ascites)    Specialty:  Gastroenterology   Contact information:   [address] [place] [address] [phone]       Follow up with [facility]. Schedule an appointment as soon as possible for a visit in 2 weeks. (Patient with newly diagnosed uncontrolled diabetes hemoglobin A1c= 13.5. Patient to establish care for primary health/diabetes control)    Specialty:  Internal Medicine   Contact information:   [address] [place] [phone]       The results of significant diagnostics from this hospitalization (including imaging, microbiology, ancillary and laboratory) are listed below for reference.    Significant Diagnostic Studies: No results found.  Microbiology: Recent Results (from the past 240 hour(s))  MRSA PCR SCREENING     Status: None   Collection Time    02/28/14  3:38 PM      Result Value Ref Range Status   MRSA by PCR NEGATIVE  NEGATIVE Final   Comment:            The GeneXpert MRSA Assay (FDA     approved for NASAL specimens     only), is one component of a     comprehensive MRSA colonization     surveillance program. It is not     intended to diagnose MRSA     infection nor to guide or     monitor treatment for     MRSA infections.     Labs: Basic Metabolic Panel:  Recent Labs Lab 02/28/14 2030 03/01/14 0147 03/01/14 0805 03/01/14 1155 03/02/14 0412  NA 137 135* 134* 133*  138  K 3.9 3.7 3.7 4.2 3.4*  CL 101 100 100 97 103  CO2 21 21 21 20 19   GLUCOSE 132* 150* 132* 252* 83  BUN 12 11 9 9 11   CREATININE 0.82 0.80 0.82 0.85 0.91  CALCIUM 8.9 8.6 8.7 9.0 9.0  MG  --   --   --  1.5 1.5   Liver Function Tests:  Recent Labs Lab 02/28/14 1034 03/01/14 1155 03/02/14 0412  AST 33 28 31  ALT 40 30 28  ALKPHOS 236* 138* 119*  BILITOT 0.4 0.8 0.3  PROT 8.6* 7.4 6.8  ALBUMIN 4.7 4.1 3.5   No results found for  this basename: LIPASE, AMYLASE,  in the last 168 hours No results found for this basename: AMMONIA,  in the last 168 hours CBC:  Recent Labs Lab 02/28/14 1034 02/28/14 1440 03/01/14 0147 03/01/14 1155 03/02/14 0412  WBC 6.9 8.1 9.1 6.8 7.3  NEUTROABS  --   --   --  2.6 2.0  HGB 14.3 13.3 12.1* 14.1 12.7*  HCT 39.0 36.7* 33.4* 38.2* 35.4*  MCV 85.2 85.3 84.8 85.5 85.7  PLT 200 165 168 183 180   Cardiac Enzymes: No results found for this basename: CKTOTAL, CKMB, CKMBINDEX, TROPONINI,  in the last 168 hours BNP: BNP (last 3 results) No results found for this basename: PROBNP,  in the last 8760 hours CBG:  Recent Labs Lab 03/01/14 2100 03/02/14 0008 03/02/14 0428 03/02/14 0818 03/02/14 1153  GLUCAP 131* 133* 89 90 228*       Signed:  Carolyne Littles, MD [facility] [phone] pager

## 2014-03-02 NOTE — Progress Notes (Signed)
Pt D/C at this time. Assisted with a W/C and taken out by the NT. Pt is a/o, free of pain/discomfort. Left with all of his belongings and was driven home by his male friend.

## 2014-03-14 ENCOUNTER — Other Ambulatory Visit (INDEPENDENT_AMBULATORY_CARE_PROVIDER_SITE_OTHER): Payer: Medicaid Other

## 2014-03-14 ENCOUNTER — Encounter: Payer: Self-pay | Admitting: Nurse Practitioner

## 2014-03-14 ENCOUNTER — Ambulatory Visit (INDEPENDENT_AMBULATORY_CARE_PROVIDER_SITE_OTHER): Payer: Medicaid Other | Admitting: Nurse Practitioner

## 2014-03-14 VITALS — BP 100/60 | HR 74 | Ht 71.0 in | Wt 158.6 lb

## 2014-03-14 DIAGNOSIS — K7460 Unspecified cirrhosis of liver: Secondary | ICD-10-CM

## 2014-03-14 DIAGNOSIS — Z1211 Encounter for screening for malignant neoplasm of colon: Secondary | ICD-10-CM

## 2014-03-14 LAB — PROTIME-INR
INR: 1.1 ratio — ABNORMAL HIGH (ref 0.8–1.0)
PROTHROMBIN TIME: 12.2 s (ref 9.6–13.1)

## 2014-03-14 NOTE — Patient Instructions (Addendum)
You have been given a separate informational sheet regarding your tobacco use, the importance of quitting and local resources to help you quit.  You have been scheduled for a Endoscopy/colonoscopy with propofol. Please follow written instructions given to you at your visit today.  Please pick up your prep kit at the pharmacy within the next 1-3 days. If you use inhalers (even only as needed), please bring them with you on the day of your procedure. Your physician has requested that you go to www.startemmi.com and enter the access code given to you at your visit today. This web site gives a general overview about your procedure. However, you should still follow specific instructions given to you by our office regarding your preparation for the procedure.;  Your physician has requested that you go to the basement for the following lab work before leaving today: AFP, INR

## 2014-03-14 NOTE — Progress Notes (Signed)
HPI :   53 year old male known to Dr. Arlyce DiceKaplan for history of ETOH cirrhosis diagnosed during a 2013 hospital admission. He had a LVP with removal of 4 liters that admission.  SAAG was compatible with portal hypertension.  Patient hasn't had any GI follow up since 2013.  Patient was recently admitted to the hospital with newly diagnosed diabetes  / DKA.Marland Kitchen.  He is here to today to follow up on cirrhosis.   No ETOH in 3 years now.  Recent LFTs normal. Patient feels quite well.    Past Medical History  Diagnosis Date  . ETOH abuse   . Cirrhosis   . Diabetes     type 1    Family History  Problem Relation Age of Onset  . Colon cancer Neg Hx   . Heart disease Mother     bi-pass  . Cancer Brother     unsure type  . Hypertension Mother   . Hypertension Father    History  Substance Use Topics  . Smoking status: Current Every Day Smoker -- 0.25 packs/day for 15 years    Types: Cigarettes  . Smokeless tobacco: Never Used     Comment: tobacco info given 03/14/14  . Alcohol Use: No   Current Outpatient Prescriptions  Medication Sig Dispense Refill  . atorvastatin (LIPITOR) 20 MG tablet Take 1 tablet (20 mg total) by mouth daily at 6 PM.  30 tablet  0  . gabapentin (NEURONTIN) 300 MG capsule Take 300 mg by mouth at bedtime.      . insulin aspart (NOVOLOG FLEXPEN) 100 UNIT/ML FlexPen Inject 5 Units into the skin 3 (three) times daily with meals.  15 mL  11  . insulin glargine (LANTUS) 100 UNIT/ML injection Inject 0.1 mLs (10 Units total) into the skin at bedtime.  10 mL  11  . Multiple Vitamin (MULTIVITAMIN WITH MINERALS) TABS tablet Take 1 tablet by mouth daily.      Marland Kitchen. spironolactone (ALDACTONE) 50 MG tablet Take 50 mg by mouth daily.       No current facility-administered medications for this visit.   No Known Allergies   Review of Systems: All systems reviewed and negative except where noted in HPI.   Physical Exam: BP 100/60  Pulse 74  Ht 5\' 11"  (1.803 m)  Wt 158 lb 9.6 oz  (71.94 kg)  BMI 22.13 kg/m2 Constitutional: Pleasant,thin black male in no acute distress. HEENT: Normocephalic and atraumatic. Conjunctivae are normal. No scleral icterus. Neck supple.  Cardiovascular: Normal rate, regular rhythm.  Pulmonary/chest: Effort normal and breath sounds normal. No wheezing, rales or rhonchi. Abdominal: Soft, nondistended, nontender. Bowel sounds active throughout. There are no masses palpable. No hepatomegaly. Extremities: no edema Lymphadenopathy: No cervical adenopathy noted. Neurological: Alert and oriented to person place and time. Skin: Skin is warm and dry. No rashes noted. Psychiatric: Normal mood and affect. Behavior is normal.   ASSESSMENT AND PLAN:   671. 53 year old male not seen by us since 2013 when he was diagnosed with alcoholic cirrhosis.  No episodes of decompensation since time of initial diagnosis. He hasn't drank ETOH in 3 years. Patient appears compensated from a liver standpoint.  EGD for varices screening. The benefits, risks, and potential complications of EGD with possible biopsies  were discussed with the patient and he agrees to proceed.   HCC screening. Will need eventual ultrasound. For now will obtain an AFP.   Check INR  2. Colon cancer screening. Appropriate screening candidate. The  risks, benefits, and alternatives to colonoscopy with possible biopsy and possible polypectomy were discussed with the patient and he consents to proceed.   3. Diabetes, recently diagnosed.     2. Patient had an EGD February 2007 for evaluation of abdominal pain. Esophagitis was found. Inflamed mucosa seen on biopsy. Findings consistent with reflux, no Barrett's.

## 2014-03-15 LAB — AFP TUMOR MARKER: AFP TUMOR MARKER: 4 ng/mL (ref 0.0–8.0)

## 2014-03-17 NOTE — Progress Notes (Signed)
Reviewed and agree with management. Donnel Venuto D. Mumin Denomme, M.D., FACG  

## 2014-03-21 ENCOUNTER — Encounter: Payer: Self-pay | Admitting: Endocrinology

## 2014-03-21 ENCOUNTER — Ambulatory Visit (INDEPENDENT_AMBULATORY_CARE_PROVIDER_SITE_OTHER): Payer: Medicaid Other | Admitting: Endocrinology

## 2014-03-21 VITALS — BP 128/60 | HR 80 | Temp 98.8°F | Ht 71.0 in | Wt 158.0 lb

## 2014-03-21 DIAGNOSIS — F17200 Nicotine dependence, unspecified, uncomplicated: Secondary | ICD-10-CM | POA: Insufficient documentation

## 2014-03-21 DIAGNOSIS — E109 Type 1 diabetes mellitus without complications: Secondary | ICD-10-CM

## 2014-03-21 MED ORDER — INSULIN GLARGINE 100 UNIT/ML SOLOSTAR PEN: 10.0000 [IU] | PEN_INJECTOR | Freq: Every day | SUBCUTANEOUS | Status: DC

## 2014-03-21 NOTE — Patient Instructions (Addendum)
good diet and exercise habits significanly improve the control of your diabetes.  please let me know if you wish to be referred to a dietician.  high blood sugar is very risky to your health.  you should see an eye doctor every year.  You are at higher than average risk for pneumonia and hepatitis-B.  You should be vaccinated against both.   controlling your blood pressure and cholesterol drastically reduces the damage diabetes does to your body.  this also applies to quitting smoking.  please discuss these with your doctor.  check your blood sugar 4 times a day: before the 3 meals, and at bedtime.  also check if you have symptoms of your blood sugar being too high or too low.  please keep a record of the readings and bring it to your next appointment here.  You can write it on any piece of paper.  please call us sooner if your blood sugar goes below 70, or if you have a lot of readings over 200. Please come back for a follow-up appointment in 2 weeks.   Please continue the same insulins.

## 2014-03-21 NOTE — Progress Notes (Signed)
Subjective:    Patient ID: Patrick [name], male    DOB: 11/21/1960, 53 y.o.   MRN: [id number]  HPI DM was dx'ed with type 1 DM in April of 2015, when he presented with DKA; he has mild if any neuropathy of the lower extremities; he is unaware of any associated chronic complications.  he has been on insulin since hosp admission.  pt says his diet and exercise are good.  He has not had severe hypoglycemia.  He says cbg's are mostly in the low-100's. He denies hypoglycemia.   Past Medical History  Diagnosis Date  . ETOH abuse   . Cirrhosis   . Diabetes     type 1    Past Surgical History  Procedure Laterality Date  . Tonsillectomy    . Mouth surgery      on gums    History   Social History  . Marital Status: Divorced    Spouse Name: N/A    Number of Children: 1  . Years of Education: N/A   Occupational History  . unemployed    Social History Main Topics  . Smoking status: Current Every Day Smoker -- 0.25 packs/day for 15 years    Types: Cigarettes  . Smokeless tobacco: Never Used     Comment: tobacco info given 03/14/14  . Alcohol Use: No  . Drug Use: No  . Sexual Activity: Not on file   Other Topics Concern  . Not on file   Social History Narrative  . No narrative on file    Current Outpatient Prescriptions on File Prior to Visit  Medication Sig Dispense Refill  . atorvastatin (LIPITOR) 20 MG tablet Take 1 tablet (20 mg total) by mouth daily at 6 PM.  30 tablet  0  . gabapentin (NEURONTIN) 300 MG capsule Take 300 mg by mouth at bedtime.      . insulin aspart (NOVOLOG FLEXPEN) 100 UNIT/ML FlexPen Inject 5 Units into the skin 3 (three) times daily with meals.  15 mL  11  . insulin glargine (LANTUS) 100 UNIT/ML injection Inject 0.1 mLs (10 Units total) into the skin at bedtime.  10 mL  11  . Multiple Vitamin (MULTIVITAMIN WITH MINERALS) TABS tablet Take 1 tablet by mouth daily.      Marland Kitchen. spironolactone (ALDACTONE) 50 MG tablet Take 50 mg by mouth daily.       No  current facility-administered medications on file prior to visit.    No Known Allergies  Family History  Problem Relation Age of Onset  . Colon cancer Neg Hx   . Heart disease Mother     bi-pass  . Cancer Brother     unsure type  . Hypertension Mother   . Hypertension Father   DM: none  BP 128/60  Pulse 80  Temp(Src) 98.8 F (37.1 C) (Oral)  Ht 5\' 11"  (1.803 m)  Wt 158 lb (71.668 kg)  BMI 22.05 kg/m2  SpO2 98%  Review of Systems denies blurry vision, headache, chest pain, sob, n/v, urinary frequency, cramps, excessive diaphoresis, memory loss, depression, cold intolerance, rhinorrhea, and easy bruising.  He has regained the few lbs he lost.      Objective:   Physical Exam VS: see vs page GEN: no distress HEAD: head: no deformity eyes: no periorbital swelling, no proptosis external nose and ears are normal mouth: no lesion seen NECK: supple, thyroid is not enlarged CHEST WALL: no deformity LUNGS: clear to auscultation BREASTS:  No gynecomastia CV: reg  rate and rhythm, no murmur ABD: abdomen is soft, nontender.  no hepatosplenomegaly.  not distended.  no hernia MUSCULOSKELETAL: muscle bulk and strength are grossly normal.  no obvious joint swelling.  gait is normal and steady PULSES: no carotid bruit NEURO:  cn 2-12 grossly intact.   readily moves all 4's.   SKIN:  Normal texture and temperature.  No rash or suspicious lesion is visible.   NODES:  None palpable at the neck PSYCH: alert, well-oriented.  Does not appear anxious nor depressed.   Lab Results  Component Value Date   HGBA1C 13.5* 02/28/2014      Assessment & Plan:  DM: apparently well-controlled DKA: resolved Smoker: this accelerates the development of chronic complications.  We discussed quitting

## 2014-03-22 DIAGNOSIS — E109 Type 1 diabetes mellitus without complications: Secondary | ICD-10-CM | POA: Insufficient documentation

## 2014-03-22 DIAGNOSIS — E1042 Type 1 diabetes mellitus with diabetic polyneuropathy: Secondary | ICD-10-CM | POA: Insufficient documentation

## 2014-03-26 ENCOUNTER — Telehealth: Payer: Self-pay | Admitting: Endocrinology

## 2014-03-26 MED ORDER — GLUCOSE BLOOD VI STRP: ORAL_STRIP | Status: DC

## 2014-03-26 MED ORDER — "PEN NEEDLES 3/16"" 31G X 5 MM MISC": Status: DC

## 2014-03-26 NOTE — Telephone Encounter (Signed)
Rx sent to Pharmacy

## 2014-03-26 NOTE — Telephone Encounter (Signed)
Pt needs 5 ml needles for his pen and the accucheck strips

## 2014-04-11 ENCOUNTER — Encounter: Payer: Self-pay | Admitting: Endocrinology

## 2014-04-11 ENCOUNTER — Encounter: Payer: Self-pay | Admitting: Gastroenterology

## 2014-04-11 ENCOUNTER — Ambulatory Visit (INDEPENDENT_AMBULATORY_CARE_PROVIDER_SITE_OTHER): Payer: Medicaid Other | Admitting: Endocrinology

## 2014-04-11 VITALS — BP 120/72 | HR 85 | Temp 98.5°F | Ht 71.0 in | Wt 162.0 lb

## 2014-04-11 DIAGNOSIS — E109 Type 1 diabetes mellitus without complications: Secondary | ICD-10-CM

## 2014-04-11 NOTE — Progress Notes (Signed)
Subjective:    Patient ID: Patrick [name], male    DOB: 08/05/1961, 53 y.o.   MRN: [id number]  HPI DM returns for f/u of type 1 DM (dx'ed in april of 2015, when he presented with DKA; he has mild if any neuropathy of the lower extremities; he is unaware of any associated chronic complications.  he has been on insulin since hosp admission; he has never had pancreatitis or severe hypoglycemia.  he brings a record of his cbg's which i have reviewed today.  It varies from 90-110.  There is no trend throughout the day, except pt says it is lower with exertion.  pt states he feels well in general.  He often eats a snack at hs.   Past Medical History  Diagnosis Date  . ETOH abuse   . Cirrhosis   . Diabetes     type 1    Past Surgical History  Procedure Laterality Date  . Tonsillectomy    . Mouth surgery      on gums    History   Social History  . Marital Status: Divorced    Spouse Name: N/A    Number of Children: 1  . Years of Education: N/A   Occupational History  . unemployed    Social History Main Topics  . Smoking status: Current Every Day Smoker -- 0.25 packs/day for 15 years    Types: Cigarettes  . Smokeless tobacco: Never Used     Comment: tobacco info given 03/14/14  . Alcohol Use: No  . Drug Use: No  . Sexual Activity: Not on file   Other Topics Concern  . Not on file   Social History Narrative  . No narrative on file    Current Outpatient Prescriptions on File Prior to Visit  Medication Sig Dispense Refill  . atorvastatin (LIPITOR) 20 MG tablet Take 1 tablet (20 mg total) by mouth daily at 6 PM.  30 tablet  0  . gabapentin (NEURONTIN) 300 MG capsule Take 300 mg by mouth at bedtime.      Marland Kitchen. glucose blood (ACCU-CHEK AVIVA PLUS) test strip Use to check Blood Sugars 4 times per day.  150 each  3  . insulin aspart (NOVOLOG FLEXPEN) 100 UNIT/ML FlexPen Inject 5 Units into the skin 3 (three) times daily with meals.  15 mL  11  . Insulin Pen Needle (PEN NEEDLES 3/16")  31G X 5 MM MISC 4 times per day  150 each  1  . Multiple Vitamin (MULTIVITAMIN WITH MINERALS) TABS tablet Take 1 tablet by mouth daily.      Marland Kitchen. spironolactone (ALDACTONE) 50 MG tablet Take 50 mg by mouth daily.       No current facility-administered medications on file prior to visit.    No Known Allergies  Family History  Problem Relation Age of Onset  . Colon cancer Neg Hx   . Heart disease Mother     bi-pass  . Cancer Brother     unsure type  . Hypertension Mother   . Hypertension Father     BP 120/72  Pulse 85  Temp(Src) 98.5 F (36.9 C) (Oral)  Ht 5\' 11"  (1.803 m)  Wt 162 lb (73.483 kg)  BMI 22.60 kg/m2  SpO2 99%  Review of Systems He denies hypoglycemia.  He has gained a few lbs.      Objective:   Physical Exam VITAL SIGNS:  See vs page GENERAL: no distress Pulses: dorsalis pedis intact bilat.  Feet: no deformity. normal color and temp.  no edema Skin:  no ulcer on the feet.   Neuro: sensation is intact to touch on the feet.    i have reviewed the records in epic from other provider(s).       Assessment & Plan:  Type 1 DM: much improved control.  The pattern of his cbg's indicates he needs some adjustment in his therapy Weight gain: this is most likely due to improved glycemic control.  However, i told pt he does not need to worry about his weight.  Patient Instructions  check your blood sugar 4 times a day: before the 3 meals, and at bedtime.  also check if you have symptoms of your blood sugar being too high or too low.  please keep a record of the readings and bring it to your next appointment here.  You can write it on any piece of paper.  please call us sooner if your blood sugar goes below 70, or if you have a lot of readings over 200. Please come back for a follow-up appointment in 2 months.   If you are going to be active, take just 3 units of novolog.   If you eat a snack at bedtime, take 2 units of novolog with it.   Please reduce lantus to 8 units  at bedtime.

## 2014-04-11 NOTE — Patient Instructions (Addendum)
check your blood sugar 4 times a day: before the 3 meals, and at bedtime.  also check if you have symptoms of your blood sugar being too high or too low.  please keep a record of the readings and bring it to your next appointment here.  You can write it on any piece of paper.  please call us sooner if your blood sugar goes below 70, or if you have a lot of readings over 200. Please come back for a follow-up appointment in 2 months.   If you are going to be active, take just 3 units of novolog.   If you eat a snack at bedtime, take 2 units of novolog with it.   Please reduce lantus to 8 units at bedtime.

## 2014-04-15 ENCOUNTER — Telehealth: Payer: Self-pay | Admitting: Gastroenterology

## 2014-04-15 MED ORDER — PEG-KCL-NACL-NASULF-NA ASC-C 100 G PO SOLR: 1.0000 | Freq: Once | ORAL | Status: DC

## 2014-04-15 NOTE — Telephone Encounter (Signed)
Called pt to inform prep  sent

## 2014-04-17 ENCOUNTER — Ambulatory Visit (AMBULATORY_SURGERY_CENTER): Payer: Medicaid Other | Admitting: Gastroenterology

## 2014-04-17 ENCOUNTER — Encounter: Payer: Self-pay | Admitting: Gastroenterology

## 2014-04-17 VITALS — BP 107/64 | HR 73 | Temp 97.3°F | Resp 19 | Ht 71.0 in | Wt 162.0 lb

## 2014-04-17 DIAGNOSIS — K7460 Unspecified cirrhosis of liver: Secondary | ICD-10-CM

## 2014-04-17 DIAGNOSIS — J312 Chronic pharyngitis: Secondary | ICD-10-CM

## 2014-04-17 DIAGNOSIS — K5730 Diverticulosis of large intestine without perforation or abscess without bleeding: Secondary | ICD-10-CM

## 2014-04-17 DIAGNOSIS — Z1211 Encounter for screening for malignant neoplasm of colon: Secondary | ICD-10-CM

## 2014-04-17 DIAGNOSIS — K2100 Gastro-esophageal reflux disease with esophagitis, without bleeding: Secondary | ICD-10-CM

## 2014-04-17 LAB — GLUCOSE, CAPILLARY
GLUCOSE-CAPILLARY: 89 mg/dL (ref 70–99)
Glucose-Capillary: 96 mg/dL (ref 70–99)
Glucose-Capillary: 88 mg/dL (ref 70–99)

## 2014-04-17 MED ORDER — SODIUM CHLORIDE 0.9 % IV SOLN: 500.0000 mL | INTRAVENOUS | Status: DC

## 2014-04-17 NOTE — Op Note (Signed)
[facility] [address] [place], [address]   COLONOSCOPY PROCEDURE REPORT  PATIENT: Patrick, [name]  MR#: [id number] BIRTHDATE: [birth year] , 52  yrs. old GENDER: Male ENDOSCOPIST: Louis Meckel, MD REFERRED BY: PROCEDURE DATE:  04/17/2014 PROCEDURE:   Colonoscopy, diagnostic First Screening Colonoscopy - Avg.  risk and is 50 yrs.  old or older Yes.  Prior Negative Screening - Now for repeat screening. N/A  History of Adenoma - Now for follow-up colonoscopy & has been > or = to 3 yrs.  N/A  Polyps Removed Today? No.  Recommend repeat exam, <10 yrs? No. ASA CLASS:   Class III INDICATIONS:average risk screening. MEDICATIONS: MAC sedation, administered by CRNA and propofol (Diprivan) 250mg  IV  DESCRIPTION OF PROCEDURE:   After the risks benefits and alternatives of the procedure were thoroughly explained, informed consent was obtained.  A digital rectal exam revealed no abnormalities of the rectum.   The [id number]  endoscope was introduced through the anus and advanced to the terminal ileum which was intubated for a short distance. No adverse events experienced.   The quality of the prep was Suprep good  The instrument was then slowly withdrawn as the colon was fully examined.      COLON FINDINGS: Mild diverticulosis was noted in the ascending colon and sigmoid colon.   A normal appearing cecum, ileocecal valve, and appendiceal orifice were identified.  The ascending, hepatic flexure, transverse, splenic flexure, descending, sigmoid colon and rectum appeared unremarkable.  No polyps or cancers were seen. The mucosa appeared normal in the terminal ileum.  Retroflexed views revealed no abnormalities. The time to cecum=4 minutes 41 seconds.  Withdrawal time=7 minutes 45 seconds.  The scope was withdrawn and the procedure completed. COMPLICATIONS: There were no complications.  ENDOSCOPIC IMPRESSION: 1.   Mild diverticulosis was noted in the  ascending colon and sigmoid colon 2.   Normal colon otherwise 3.   Normal mucosa in the terminal ileum  RECOMMENDATIONS: Continue current colorectal screening recommendations for "routine risk" patients with a repeat colonoscopy in 10 years.   eSigned:  Louis Meckel, MD 04/17/2014 2:07 PM   cc: Wyonia Hough, MD   PATIENT NAME:  Patrick [name] MR#: [id number]

## 2014-04-17 NOTE — Op Note (Signed)
[facility] [address] [place], [address]   ENDOSCOPY PROCEDURE REPORT  PATIENT: Patrick, [name]  MR#: [id number] BIRTHDATE: 12-26-1960 , 52  yrs. old GENDER: Male ENDOSCOPIST: Louis Meckel, MD REFERRED BY: PROCEDURE DATE:  04/17/2014 PROCEDURE:  EGD w/ biopsy ASA CLASS:     Class III INDICATIONS:  Screening for varices. MEDICATIONS: There was residual sedation effect present from prior procedure, MAC sedation, administered by CRNA, propofol (Diprivan) 150mg  IV, and Simethicone 0.6cc PO TOPICAL ANESTHETIC:  DESCRIPTION OF PROCEDURE: After the risks benefits and alternatives of the procedure were thoroughly explained, informed consent was obtained.  The LB [id number] endoscope was introduced through the mouth and advanced to the third portion of the duodenum. Without limitations.  The instrument was slowly withdrawn as the mucosa was fully examined.      There was irregularity at the GE junction.  There were several islands of gastric appearing mucosa that extended proximally at least 1 cm.  Biopsies were taken to rule out Barrett's esophagus. The remainder of the upper endoscopy exam was otherwise normal. Retroflexed views revealed no abnormalities.     The scope was then withdrawn from the patient and the procedure completed.  COMPLICATIONS: There were no complications. ENDOSCOPIC IMPRESSION: 1.   There was irregularity at the GE junction.  There were several islands of gastric appearing mucosa that extended proximally at least 1 cm.  Biopsies were taken to rule out Barrett's esophagus. 2.   The remainder of the upper endoscopy exam was otherwise normal  RECOMMENDATIONS: Await biopsy results  REPEAT EXAM: one year eSigned:  Louis Meckel, MD 04/17/2014 2:10 PM   CC: Wyonia Hough, MD

## 2014-04-17 NOTE — Progress Notes (Signed)
Called to room to assist during endoscopic procedure.  Patient ID and intended procedure confirmed with present staff. Received instructions for my participation in the procedure from the performing physician.  

## 2014-04-17 NOTE — Patient Instructions (Signed)

## 2014-04-17 NOTE — Progress Notes (Signed)
Blood sugar rechecked 96. Still no symptoms of hypoglycemia.

## 2014-04-17 NOTE — Progress Notes (Signed)
Patient stating on admission he checked his blood sugar this am at 1100, 130 BS.  On admission blood sugar was 89, nonsymptomatic. Patient stating his sugar usually runs 90-130.

## 2014-04-18 ENCOUNTER — Telehealth: Payer: Self-pay | Admitting: *Deleted

## 2014-04-18 NOTE — Telephone Encounter (Signed)
  Follow up Call-  Call back number 04/17/2014  Post procedure Call Back phone  # [phone]  Permission to leave phone message Yes     Patient questions:  Do you have a fever, pain , or abdominal swelling? no Pain Score  0 *  Have you tolerated food without any problems? yes  Have you been able to return to your normal activities? yes  Do you have any questions about your discharge instructions: Diet   no Medications  no Follow up visit  no  Do you have questions or concerns about your Care? no  Actions: * If pain score is 4 or above: No action needed, pain <4.

## 2014-04-24 ENCOUNTER — Encounter: Payer: Self-pay | Admitting: Gastroenterology

## 2014-06-11 ENCOUNTER — Encounter: Payer: Self-pay | Admitting: Endocrinology

## 2014-06-11 ENCOUNTER — Ambulatory Visit (INDEPENDENT_AMBULATORY_CARE_PROVIDER_SITE_OTHER): Payer: Medicaid Other | Admitting: Endocrinology

## 2014-06-11 VITALS — BP 128/78 | HR 79 | Temp 97.9°F | Ht 71.0 in | Wt 165.0 lb

## 2014-06-11 DIAGNOSIS — E109 Type 1 diabetes mellitus without complications: Secondary | ICD-10-CM

## 2014-06-11 LAB — HEMOGLOBIN A1C: HEMOGLOBIN A1C: 6.5 % (ref 4.6–6.5)

## 2014-06-11 LAB — MICROALBUMIN / CREATININE URINE RATIO
Creatinine,U: 150.6 mg/dL
Microalb Creat Ratio: 0.1 mg/g (ref 0.0–30.0)
Microalb, Ur: 0.2 mg/dL (ref 0.0–1.9)

## 2014-06-11 LAB — TSH: TSH: 0.6 u[IU]/mL (ref 0.35–4.50)

## 2014-06-11 NOTE — Progress Notes (Signed)
Subjective:    Patient ID: Patrick [name], male    DOB: 12/13/1960, 53 y.o.   MRN: [id number]  HPI DM returns for f/u of type 1 DM (dx'ed in april of 2015, when he presented with DKA; he has mild if any neuropathy of the lower extremities; he is unaware of any associated chronic complications; he has been on insulin since dx; he has never had pancreatitis or severe hypoglycemia; he declines pump rx; he takes multiple daily injections).  no cbg record, but states cbg's vary from 80-100.  He had to reduce the insulin, due to mild hypoglycemia.   Past Medical History  Diagnosis Date  . ETOH abuse   . Cirrhosis   . Diabetes     type 1    Past Surgical History  Procedure Laterality Date  . Tonsillectomy    . Mouth surgery      on gums    History   Social History  . Marital Status: Divorced    Spouse Name: N/A    Number of Children: 1  . Years of Education: N/A   Occupational History  . unemployed    Social History Main Topics  . Smoking status: Current Every Day Smoker -- 0.50 packs/day for 15 years    Types: Cigarettes  . Smokeless tobacco: Never Used     Comment: tobacco info given 03/14/14  . Alcohol Use: No  . Drug Use: No  . Sexual Activity: Not on file   Other Topics Concern  . Not on file   Social History Narrative  . No narrative on file    Current Outpatient Prescriptions on File Prior to Visit  Medication Sig Dispense Refill  . atorvastatin (LIPITOR) 20 MG tablet Take 1 tablet (20 mg total) by mouth daily at 6 PM.  30 tablet  0  . gabapentin (NEURONTIN) 300 MG capsule Take 300 mg by mouth at bedtime.      Marland Kitchen. glucose blood (ACCU-CHEK AVIVA PLUS) test strip Use to check Blood Sugars 4 times per day.  150 each  3  . Insulin Glargine (LANTUS) 100 UNIT/ML Solostar Pen Inject 6 Units into the skin at bedtime. And pen needles 4/day      . Insulin Pen Needle (PEN NEEDLES 3/16") 31G X 5 MM MISC 4 times per day  150 each  1  . Multiple Vitamin (MULTIVITAMIN WITH  MINERALS) TABS tablet Take 1 tablet by mouth daily.      Marland Kitchen. spironolactone (ALDACTONE) 50 MG tablet Take 50 mg by mouth daily.       No current facility-administered medications on file prior to visit.    No Known Allergies  Family History  Problem Relation Age of Onset  . Colon cancer Neg Hx   . Heart disease Mother     bi-pass  . Hypertension Mother   . Cancer Brother     unsure type  . Hypertension Father     BP 128/78  Pulse 79  Temp(Src) 97.9 F (36.6 C) (Oral)  Ht 5\' 11"  (1.803 m)  Wt 165 lb (74.844 kg)  BMI 23.02 kg/m2  SpO2 98%   Review of Systems He has regained the weight he lost.  Denies LOC.    Objective:   Physical Exam VITAL SIGNS:  See vs page GENERAL: no distress PSYCH: Alert and well-oriented.  Does not appear anxious nor depressed.  Lab Results  Component Value Date   HGBA1C 6.5 06/11/2014      Assessment & Plan:  DM: overcontrolled.  He appears to be in "honeymoon" period. Side-effect of treatment: mild hypoglycemia: new. Noncompliance with cbg recording: I'll work around this as best I can.    Patient is advised the following: Patient Instructions  check your blood sugar 4 times a day: before the 3 meals, and at bedtime.  also check if you have symptoms of your blood sugar being too high or too low.  please keep a record of the readings and bring it to your next appointment here.  You can write it on any piece of paper.  please call us sooner if your blood sugar goes below 70, or if you have a lot of readings over 200. Please come back for a follow-up appointment in 3 months.   Reduce novolog to just 3 units 3 times a day (just before each meal).  If you are going to be active, take just 3 units.  If you eat a snack at bedtime, take 2 units of novolog with it.   Please reduce lantus to 6 units at bedtime.   blood tests are being requested for you today.  We'll contact you with results.

## 2014-06-11 NOTE — Patient Instructions (Addendum)
check your blood sugar 4 times a day: before the 3 meals, and at bedtime.  also check if you have symptoms of your blood sugar being too high or too low.  please keep a record of the readings and bring it to your next appointment here.  You can write it on any piece of paper.  please call us sooner if your blood sugar goes below 70, or if you have a lot of readings over 200. Please come back for a follow-up appointment in 3 months.   Reduce novolog to just 3 units 3 times a day (just before each meal).  If you are going to be active, take just 3 units.  If you eat a snack at bedtime, take 2 units of novolog with it.   Please reduce lantus to 6 units at bedtime.   blood tests are being requested for you today.  We'll contact you with results.

## 2014-06-16 ENCOUNTER — Telehealth: Payer: Self-pay | Admitting: Endocrinology

## 2014-06-16 MED ORDER — "PEN NEEDLES 3/16"" 31G X 5 MM MISC": Status: DC

## 2014-06-16 MED ORDER — GLUCOSE BLOOD VI STRP: ORAL_STRIP | Status: DC

## 2014-06-16 NOTE — Telephone Encounter (Signed)
[facility] Rx sent to [facility]

## 2014-06-16 NOTE — Telephone Encounter (Signed)
Patient would like needles for pens and test strips  [facility]  Thank you

## 2014-06-16 NOTE — Telephone Encounter (Signed)
Rx sent to pharmacy   

## 2014-06-16 NOTE — Telephone Encounter (Signed)
Pharmacy called patient need different size pin needles they no longer have 5 mm pin, needles but they have 6 mm pin needles.  Thank you

## 2014-08-06 ENCOUNTER — Other Ambulatory Visit: Payer: Self-pay | Admitting: Endocrinology

## 2014-08-06 NOTE — Telephone Encounter (Signed)
Patient need a refill of pen needles sent to his pharmacy.

## 2014-09-11 ENCOUNTER — Ambulatory Visit (INDEPENDENT_AMBULATORY_CARE_PROVIDER_SITE_OTHER): Payer: Medicaid Other | Admitting: Endocrinology

## 2014-09-11 ENCOUNTER — Encounter: Payer: Self-pay | Admitting: Endocrinology

## 2014-09-11 VITALS — BP 118/70 | HR 77 | Temp 97.9°F | Ht 71.0 in | Wt 164.0 lb

## 2014-09-11 DIAGNOSIS — E1042 Type 1 diabetes mellitus with diabetic polyneuropathy: Secondary | ICD-10-CM

## 2014-09-11 LAB — HEMOGLOBIN A1C: HEMOGLOBIN A1C: 6 % (ref 4.6–6.5)

## 2014-09-11 NOTE — Patient Instructions (Addendum)
check your blood sugar 4 times a day: before the 3 meals, and at bedtime.  also check if you have symptoms of your blood sugar being too high or too low.  please keep a record of the readings and bring it to your next appointment here.  You can write it on any piece of paper.  please call us sooner if your blood sugar goes below 70, or if you have a lot of readings over 200. Please come back for a follow-up appointment in 3 months.    A diabetes blood test is requested for you today.  We'll contact you with results.

## 2014-09-11 NOTE — Progress Notes (Signed)
Subjective:    Patient ID: Patrick [name], male    DOB: 10/22/1961, 53 y.o.   MRN: [id number]  HPI Pt returns for f/u of diabetes mellitus: DM type: 1 Dx'ed: 2015 Complications: painful polyneuropathy. Therapy: insulin since dx DKA: only at time of dx Severe hypoglycemia: never.  Pancreatitis: never Other: he declines pump rx; he takes multiple daily injections; he stopped drinking EtOH in 2010 Interval history:  no cbg record, but states cbg's are well-controlled.  It is in general higher as the day goes on.  He denies hypoglycemia.   Past Medical History  Diagnosis Date  . ETOH abuse   . Cirrhosis   . Diabetes     type 1    Past Surgical History  Procedure Laterality Date  . Tonsillectomy    . Mouth surgery      on gums    History   Social History  . Marital Status: Divorced    Spouse Name: N/A    Number of Children: 1  . Years of Education: N/A   Occupational History  . unemployed    Social History Main Topics  . Smoking status: Current Every Day Smoker -- 0.50 packs/day for 15 years    Types: Cigarettes  . Smokeless tobacco: Never Used     Comment: tobacco info given 03/14/14  . Alcohol Use: No  . Drug Use: No  . Sexual Activity: Not on file   Other Topics Concern  . Not on file   Social History Narrative  . No narrative on file    Current Outpatient Prescriptions on File Prior to Visit  Medication Sig Dispense Refill  . atorvastatin (LIPITOR) 20 MG tablet Take 1 tablet (20 mg total) by mouth daily at 6 PM.  30 tablet  0  . B-D UF III MINI PEN NEEDLES 31G X 5 MM MISC USE WITH INSULIN 4 TIMES PER DAY  150 each  3  . gabapentin (NEURONTIN) 300 MG capsule Take 300 mg by mouth. 1 tab in the morning 2 in the evening      . glucose blood (ACCU-CHEK AVIVA PLUS) test strip Use to check Blood Sugars 4 times per day.  150 each  3  . insulin aspart (NOVOLOG) 100 UNIT/ML FlexPen Inject 3 Units into the skin 3 (three) times daily with meals.      . Insulin  Glargine (LANTUS) 100 UNIT/ML Solostar Pen Inject 6 Units into the skin at bedtime. And pen needles 4/day      . Multiple Vitamin (MULTIVITAMIN WITH MINERALS) TABS tablet Take 1 tablet by mouth daily.      Marland Kitchen. spironolactone (ALDACTONE) 50 MG tablet Take 50 mg by mouth daily.       No current facility-administered medications on file prior to visit.    No Known Allergies  Family History  Problem Relation Age of Onset  . Colon cancer Neg Hx   . Heart disease Mother     bi-pass  . Hypertension Mother   . Cancer Brother     unsure type  . Hypertension Father     BP 118/70  Pulse 77  Temp(Src) 97.9 F (36.6 C) (Oral)  Ht 5\' 11"  (1.803 m)  Wt 164 lb (74.39 kg)  BMI 22.88 kg/m2  SpO2 99%  Review of Systems denies weight change.  Denies LOC.    Objective:   Physical Exam VITAL SIGNS:  See vs page GENERAL: no distress Pulses: dorsalis pedis intact bilat.   Feet: no deformity.  no edema Skin:  no ulcer on the feet.  normal color and temp. Neuro: sensation is intact to touch on the feet   Lab Results  Component Value Date   HGBA1C 6.0 09/11/2014      Assessment & Plan:  DM: overcontrolled.  He appears to be entering a  "honeymoon" period. Side-effect of treatment: mild hypoglycemia:  Noncompliance with cbg recording: I'll work around this as best I can.    Patient is advised the following: Patient Instructions  check your blood sugar 4 times a day: before the 3 meals, and at bedtime.  also check if you have symptoms of your blood sugar being too high or too low.  please keep a record of the readings and bring it to your next appointment here.  You can write it on any piece of paper.  please call us sooner if your blood sugar goes below 70, or if you have a lot of readings over 200. Please come back for a follow-up appointment in 3 months.    A diabetes blood test is requested for you today.  We'll contact you with results.    addendum: reduce lantus

## 2014-10-15 ENCOUNTER — Telehealth: Payer: Self-pay | Admitting: Endocrinology

## 2014-11-17 ENCOUNTER — Telehealth: Payer: Self-pay | Admitting: Endocrinology

## 2014-11-17 NOTE — Telephone Encounter (Signed)
rx se4nt

## 2014-11-17 NOTE — Telephone Encounter (Signed)
Patient need refill of BD up lll mini pen needles 31G x 5 mm mg [facility]

## 2014-11-19 NOTE — Telephone Encounter (Signed)
error 

## 2014-12-12 ENCOUNTER — Ambulatory Visit: Payer: Medicaid Other | Admitting: Endocrinology

## 2014-12-19 ENCOUNTER — Ambulatory Visit (INDEPENDENT_AMBULATORY_CARE_PROVIDER_SITE_OTHER): Payer: Medicaid Other | Admitting: Endocrinology

## 2014-12-19 ENCOUNTER — Encounter: Payer: Self-pay | Admitting: Endocrinology

## 2014-12-19 VITALS — BP 118/82 | HR 73 | Temp 98.2°F | Ht 71.0 in | Wt 163.0 lb

## 2014-12-19 DIAGNOSIS — E1042 Type 1 diabetes mellitus with diabetic polyneuropathy: Secondary | ICD-10-CM

## 2014-12-19 LAB — HEMOGLOBIN A1C: Hgb A1c MFr Bld: 6.7 % — ABNORMAL HIGH (ref 4.6–6.5)

## 2014-12-19 NOTE — Patient Instructions (Signed)
check your blood sugar 4 times a day: before the 3 meals, and at bedtime.  also check if you have symptoms of your blood sugar being too high or too low.  please keep a record of the readings and bring it to your next appointment here.  You can write it on any piece of paper.  please call us sooner if your blood sugar goes below 70, or if you have a lot of readings over 200. Please come back for a follow-up appointment in 3 months.    A diabetes blood test is requested for you today.  We'll contact you with results.   

## 2014-12-19 NOTE — Progress Notes (Signed)
Subjective:    Patient ID: Patrick [name], male    DOB: 02/10/1961, 54 y.o.   MRN: [id number]  HPI Pt returns for f/u of diabetes mellitus: DM type: 1 Dx'ed: 2015 Complications: painful polyneuropathy. Therapy: insulin since dx DKA: only at time of dx Severe hypoglycemia: never.  Pancreatitis: never Other: he declines pump rx; he takes multiple daily injections; he stopped drinking EtOH in 2010 Interval history: no cbg record, but states cbg's are well-controlled.  It is in general higher as the day goes on.   Past Medical History  Diagnosis Date  . ETOH abuse   . Cirrhosis   . Diabetes     type 1    Past Surgical History  Procedure Laterality Date  . Tonsillectomy    . Mouth surgery      on gums    History   Social History  . Marital Status: Divorced    Spouse Name: N/A    Number of Children: 1  . Years of Education: N/A   Occupational History  . unemployed    Social History Main Topics  . Smoking status: Current Every Day Smoker -- 0.50 packs/day for 15 years    Types: Cigarettes  . Smokeless tobacco: Never Used     Comment: tobacco info given 03/14/14  . Alcohol Use: No  . Drug Use: No  . Sexual Activity: Not on file   Other Topics Concern  . Not on file   Social History Narrative    Current Outpatient Prescriptions on File Prior to Visit  Medication Sig Dispense Refill  . atorvastatin (LIPITOR) 20 MG tablet Take 1 tablet (20 mg total) by mouth daily at 6 PM. 30 tablet 0  . B-D UF III MINI PEN NEEDLES 31G X 5 MM MISC USE WITH INSULIN 4 TIMES PER DAY 100 each 3  . gabapentin (NEURONTIN) 300 MG capsule Take 300 mg by mouth. 1 tab in the morning 2 in the evening    . glucose blood (ACCU-CHEK AVIVA PLUS) test strip Use to check Blood Sugars 4 times per day. 150 each 3  . insulin aspart (NOVOLOG) 100 UNIT/ML FlexPen Inject 3 Units into the skin 3 (three) times daily with meals.    . Insulin Glargine (LANTUS) 100 UNIT/ML Solostar Pen Inject 2 Units into  the skin at bedtime. And pen needles 4/day    . Multiple Vitamin (MULTIVITAMIN WITH MINERALS) TABS tablet Take 1 tablet by mouth daily.    Marland Kitchen. spironolactone (ALDACTONE) 50 MG tablet Take 50 mg by mouth daily.     No current facility-administered medications on file prior to visit.    No Known Allergies  Family History  Problem Relation Age of Onset  . Colon cancer Neg Hx   . Heart disease Mother     bi-pass  . Hypertension Mother   . Cancer Brother     unsure type  . Hypertension Father     BP 118/82 mmHg  Pulse 73  Temp(Src) 98.2 F (36.8 C) (Oral)  Ht 5\' 11"  (1.803 m)  Wt 163 lb (73.936 kg)  BMI 22.74 kg/m2  SpO2 98%    Review of Systems He denies hypoglycemia and weight change    Objective:   Physical Exam VITAL SIGNS:  See vs page GENERAL: no distress Pulses: dorsalis pedis intact bilat.   MSK: no deformity of the feet CV: no leg edema Skin:  no ulcer on the feet.  normal color and temp on the feet. Neuro: sensation  is intact to touch on the feet, but slightly decreased from normal Ext: There is bilateral onychomycosis of the toenails.   Lab Results  Component Value Date   HGBA1C 6.7* 12/19/2014      Assessment & Plan:  DM: well-controlled.  Please continue the same insulin.  Patient is advised the following: Patient Instructions  check your blood sugar 4 times a day: before the 3 meals, and at bedtime.  also check if you have symptoms of your blood sugar being too high or too low.  please keep a record of the readings and bring it to your next appointment here.  You can write it on any piece of paper.  please call us sooner if your blood sugar goes below 70, or if you have a lot of readings over 200. Please come back for a follow-up appointment in 3 months.    A diabetes blood test is requested for you today.  We'll contact you with results.

## 2014-12-29 ENCOUNTER — Other Ambulatory Visit: Payer: Self-pay | Admitting: Endocrinology

## 2015-02-28 ENCOUNTER — Other Ambulatory Visit: Payer: Self-pay | Admitting: Endocrinology

## 2015-03-20 ENCOUNTER — Ambulatory Visit: Payer: Medicaid Other | Admitting: Endocrinology

## 2015-03-20 ENCOUNTER — Ambulatory Visit (INDEPENDENT_AMBULATORY_CARE_PROVIDER_SITE_OTHER): Payer: Medicaid Other | Admitting: Endocrinology

## 2015-03-20 ENCOUNTER — Encounter: Payer: Self-pay | Admitting: Endocrinology

## 2015-03-20 VITALS — BP 102/60 | HR 72 | Temp 98.3°F | Ht 71.0 in | Wt 156.0 lb

## 2015-03-20 DIAGNOSIS — E1042 Type 1 diabetes mellitus with diabetic polyneuropathy: Secondary | ICD-10-CM | POA: Diagnosis not present

## 2015-03-20 LAB — HEMOGLOBIN A1C: HEMOGLOBIN A1C: 6.3 % (ref 4.6–6.5)

## 2015-03-20 NOTE — Patient Instructions (Addendum)
check your blood sugar 4 times a day: before the 3 meals, and at bedtime.  also check if you have symptoms of your blood sugar being too high or too low.  please keep a record of the readings and bring it to your next appointment here.  You can write it on any piece of paper.  please call us sooner if your blood sugar goes below 70, or if you have a lot of readings over 200. Please come back for a follow-up appointment in 4 months.    A diabetes blood test is requested for you today.  We'll contact you with results.

## 2015-03-20 NOTE — Progress Notes (Signed)
Subjective:    Patient ID: Patrick [name], male    DOB: Jul 02, 1961, 54 y.o.   MRN: [id number]  HPI Pt returns for f/u of diabetes mellitus: DM type: 1 Dx'ed: 2015 Complications: painful polyneuropathy. Therapy: insulin since dx DKA: only at time of dx Severe hypoglycemia: never.  Pancreatitis: never Other: he declines pump rx; he takes multiple daily injections; he stopped drinking EtOH in 2010 Interval history: no cbg record, but states cbg's vary from 77-100's.  It is lowest in the afternoon, and higher at other times of day. Past Medical History  Diagnosis Date  . ETOH abuse   . Cirrhosis   . Diabetes     type 1    Past Surgical History  Procedure Laterality Date  . Tonsillectomy    . Mouth surgery      on gums    History   Social History  . Marital Status: Divorced    Spouse Name: N/A  . Number of Children: 1  . Years of Education: N/A   Occupational History  . unemployed    Social History Main Topics  . Smoking status: Current Every Day Smoker -- 0.50 packs/day for 15 years    Types: Cigarettes  . Smokeless tobacco: Never Used     Comment: tobacco info given 03/14/14  . Alcohol Use: No  . Drug Use: No  . Sexual Activity: Not on file   Other Topics Concern  . Not on file   Social History Narrative    Current Outpatient Prescriptions on File Prior to Visit  Medication Sig Dispense Refill  . ACCU-CHEK AVIVA PLUS test strip USE TO CHECK BLOOD SUGARS 4 TIMES PER DAY. 150 each 3  . atorvastatin (LIPITOR) 20 MG tablet Take 1 tablet (20 mg total) by mouth daily at 6 PM. 30 tablet 0  . B-D UF III MINI PEN NEEDLES 31G X 5 MM MISC USE WITH INSULIN 4 TIMES PER DAY 100 each 3  . gabapentin (NEURONTIN) 300 MG capsule Take 300 mg by mouth. 1 tab in the morning 2 in the evening    . insulin aspart (NOVOLOG) 100 UNIT/ML FlexPen 3 times a day (just before each meal), 3-2-3 units.    . Insulin Glargine (LANTUS) 100 UNIT/ML Solostar Pen Inject 2 Units into the skin at  bedtime. And pen needles 4/day    . Multiple Vitamin (MULTIVITAMIN WITH MINERALS) TABS tablet Take 1 tablet by mouth daily.    Marland Kitchen spironolactone (ALDACTONE) 50 MG tablet Take 50 mg by mouth daily.     No current facility-administered medications on file prior to visit.    No Known Allergies  Family History  Problem Relation Age of Onset  . Colon cancer Neg Hx   . Heart disease Mother     bi-pass  . Hypertension Mother   . Cancer Brother     unsure type  . Hypertension Father     BP 102/60 mmHg  Pulse 72  Temp(Src) 98.3 F (36.8 C) (Oral)  Ht  (1.803 m)  Wt 156 lb (70.761 kg)  BMI 21.77 kg/m2  SpO2 98%   Review of Systems He denies hypoglycemia    Objective:   Physical Exam VITAL SIGNS:  See vs page GENERAL: no distress Pulses: dorsalis pedis intact bilat.   MSK: no deformity of the feet CV: no leg edema Skin:  no ulcer on the feet.  normal color and temp on the feet. Neuro: sensation is intact to touch on the feet,  but decreased from normal Ext: There is bilateral onychomycosis of the toenails  Lab Results  Component Value Date   HGBA1C 6.3 03/20/2015      Assessment & Plan:  DM: slightly overcontrolled  Patient is advised the following: Patient Instructions  check your blood sugar 4 times a day: before the 3 meals, and at bedtime.  also check if you have symptoms of your blood sugar being too high or too low.  please keep a record of the readings and bring it to your next appointment here.  You can write it on any piece of paper.  please call us sooner if your blood sugar goes below 70, or if you have a lot of readings over 200. Please come back for a follow-up appointment in 4 months.    A diabetes blood test is requested for you today.  We'll contact you with results.    addendum: decrease lunch novolog to 2 units.

## 2015-04-22 ENCOUNTER — Other Ambulatory Visit: Payer: Self-pay | Admitting: Endocrinology

## 2015-04-28 ENCOUNTER — Telehealth: Payer: Self-pay | Admitting: Endocrinology

## 2015-04-28 NOTE — Telephone Encounter (Signed)
Patient called stating that his blood sugars have been fluctuating  Patrick [name] is concerned and would like to be seen as [name] changed his insulin dosage   6.6.16 at 7:20 pm   150 6.6.16 at 9:10 pm   52 6.6.16 at 9:15 ate candy drank juice  65 6.6.16 at 9:24 pm 85 6.6.16 at 10:58 pm 141   6.7.16 at 6:59 am 119  Please advise patient   Thank you

## 2015-04-28 NOTE — Telephone Encounter (Signed)
Patient advised of note below and voiced understanding.  

## 2015-04-28 NOTE — Telephone Encounter (Signed)
See note below and please advise, Thanks! 

## 2015-04-28 NOTE — Telephone Encounter (Signed)
Please decrease novolog to 3 times a day (just before each meal) 3-2-2 units

## 2015-06-16 ENCOUNTER — Other Ambulatory Visit: Payer: Self-pay | Admitting: Endocrinology

## 2015-06-25 ENCOUNTER — Other Ambulatory Visit: Payer: Self-pay | Admitting: Endocrinology

## 2015-06-30 ENCOUNTER — Telehealth: Payer: Self-pay | Admitting: Endocrinology

## 2015-06-30 NOTE — Telephone Encounter (Signed)
Insurance is not covering the test strips for the accucheck aviva plus meter anymore please advise

## 2015-07-01 NOTE — Telephone Encounter (Signed)
I contacted the pt's pharmacy. Pharmacy stated they needed the updated prescription card from the pt to dispense the test strips. I contacted the pt's emergency contact and advised of this. She will advise the pt.

## 2015-07-20 ENCOUNTER — Encounter: Payer: Self-pay | Admitting: Endocrinology

## 2015-07-20 ENCOUNTER — Ambulatory Visit (INDEPENDENT_AMBULATORY_CARE_PROVIDER_SITE_OTHER): Payer: Medicaid Other | Admitting: Endocrinology

## 2015-07-20 VITALS — BP 122/64 | HR 69 | Temp 98.0°F | Ht 71.0 in | Wt 149.0 lb

## 2015-07-20 DIAGNOSIS — E1042 Type 1 diabetes mellitus with diabetic polyneuropathy: Secondary | ICD-10-CM

## 2015-07-20 LAB — POCT GLYCOSYLATED HEMOGLOBIN (HGB A1C): HEMOGLOBIN A1C: 6.1

## 2015-07-20 MED ORDER — INSULIN ASPART 100 UNIT/ML FLEXPEN: PEN_INJECTOR | SUBCUTANEOUS | Status: DC

## 2015-07-20 NOTE — Patient Instructions (Addendum)
check your blood sugar 4 times a day: before the 3 meals, and at bedtime.  also check if you have symptoms of your blood sugar being too high or too low.  please keep a record of the readings and bring it to your next appointment here.  You can write it on any piece of paper.  please call us sooner if your blood sugar goes below 70, or if you have a lot of readings over 200. Please come back for a follow-up appointment in 4 months.   Please skip the lunch novolog.

## 2015-07-20 NOTE — Progress Notes (Signed)
Subjective:    Patient ID: Patrick [name], male    DOB: 1961-06-01, 54 y.o.   MRN: [id number]  HPI Pt returns for f/u of diabetes mellitus: DM type: 1 Dx'ed: 2015 Complications: painful polyneuropathy. Therapy: insulin since dx DKA: only at time of dx Severe hypoglycemia: never.  Pancreatitis: never Other: he declines pump rx; he takes multiple daily injections; he stopped drinking EtOH in 2010 Interval history: no cbg record, but states cbg's are no longer low after he reduced the novolog to 2 units 3 times a day (just before each meal).  It is lowest in the afternoon.   Past Medical History  Diagnosis Date  . ETOH abuse   . Cirrhosis   . Diabetes     type 1    Past Surgical History  Procedure Laterality Date  . Tonsillectomy    . Mouth surgery      on gums    Social History   Social History  . Marital Status: Divorced    Spouse Name: N/A  . Number of Children: 1  . Years of Education: N/A   Occupational History  . unemployed    Social History Main Topics  . Smoking status: Current Every Day Smoker -- 0.50 packs/day for 15 years    Types: Cigarettes  . Smokeless tobacco: Never Used     Comment: tobacco info given 03/14/14  . Alcohol Use: No  . Drug Use: No  . Sexual Activity: Not on file   Other Topics Concern  . Not on file   Social History Narrative    Current Outpatient Prescriptions on File Prior to Visit  Medication Sig Dispense Refill  . ACCU-CHEK AVIVA PLUS test strip USE TO CHECK BLOOD SUGARS 4 TIMES PER DAY. 150 each 3  . atorvastatin (LIPITOR) 20 MG tablet Take 1 tablet (20 mg total) by mouth daily at 6 PM. 30 tablet 0  . B-D UF III MINI PEN NEEDLES 31G X 5 MM MISC USE WITH INSULIN 4 TIMES PER DAY 100 each 3  . COMBIGAN 0.2-0.5 % ophthalmic solution   0  . DUREZOL 0.05 % EMUL   0  . gabapentin (NEURONTIN) 300 MG capsule Take 300 mg by mouth. 1 tab in the morning 2 in the evening    . ILEVRO 0.3 % ophthalmic suspension   0  . Insulin  Glargine (LANTUS) 100 UNIT/ML Solostar Pen Inject 2 Units into the skin at bedtime. And pen needles 4/day    . LOTEMAX 0.5 % GEL   3  . Multiple Vitamin (MULTIVITAMIN WITH MINERALS) TABS tablet Take 1 tablet by mouth daily.    . prednisoLONE acetate (PRED FORTE) 1 % ophthalmic suspension   3  . RESTASIS 0.05 % ophthalmic emulsion   4  . spironolactone (ALDACTONE) 50 MG tablet Take 50 mg by mouth daily.     No current facility-administered medications on file prior to visit.    No Known Allergies  Family History  Problem Relation Age of Onset  . Colon cancer Neg Hx   . Heart disease Mother     bi-pass  . Hypertension Mother   . Cancer Brother     unsure type  . Hypertension Father     BP 122/64 mmHg  Pulse 69  Temp(Src) 98 F (36.7 C) (Oral)  Ht  (1.803 m)  Wt 149 lb (67.586 kg)  BMI 20.79 kg/m2  SpO2 98%  Review of Systems He denies LOC.  Objective:   Physical Exam VITAL SIGNS:  See vs page GENERAL: no distress Pulses: dorsalis pedis intact bilat.   MSK: no deformity of the feet CV: no leg edema.   Skin:  no ulcer on the feet.  normal color and temp on the feet. Neuro: sensation is intact to touch on the feet Ext: There is bilateral onychomycosis of the toenails.     A1c=6.1%    Assessment & Plan:  DM: overcontrolled, due to partial (and temporary) remission.  Patient is advised the following: Patient Instructions  check your blood sugar 4 times a day: before the 3 meals, and at bedtime.  also check if you have symptoms of your blood sugar being too high or too low.  please keep a record of the readings and bring it to your next appointment here.  You can write it on any piece of paper.  please call us sooner if your blood sugar goes below 70, or if you have a lot of readings over 200. Please come back for a follow-up appointment in 4 months.   Please skip the lunch novolog.

## 2015-10-21 ENCOUNTER — Other Ambulatory Visit: Payer: Self-pay | Admitting: Endocrinology

## 2015-11-25 ENCOUNTER — Ambulatory Visit (INDEPENDENT_AMBULATORY_CARE_PROVIDER_SITE_OTHER): Payer: Medicaid Other | Admitting: Endocrinology

## 2015-11-25 ENCOUNTER — Encounter: Payer: Self-pay | Admitting: Endocrinology

## 2015-11-25 VITALS — BP 108/82 | HR 70 | Temp 97.9°F | Ht 71.0 in | Wt 156.0 lb

## 2015-11-25 DIAGNOSIS — Z23 Encounter for immunization: Secondary | ICD-10-CM | POA: Diagnosis not present

## 2015-11-25 DIAGNOSIS — E1042 Type 1 diabetes mellitus with diabetic polyneuropathy: Secondary | ICD-10-CM

## 2015-11-25 LAB — POCT GLYCOSYLATED HEMOGLOBIN (HGB A1C): HEMOGLOBIN A1C: 6.5

## 2015-11-25 NOTE — Patient Instructions (Addendum)
check your blood sugar 4 times a day: before the 3 meals, and at bedtime.  also check if you have symptoms of your blood sugar being too high or too low.  please keep a record of the readings and bring it to your next appointment here.  You can write it on any piece of paper.  please call us sooner if your blood sugar goes below 70, or if you have a lot of readings over 200. Please come back for a follow-up appointment in 3 months.   Please stop taking the lantus.  Please continue the same novolog.

## 2015-11-25 NOTE — Progress Notes (Signed)
Subjective:    Patient ID: Patrick [name], male    DOB: [birth year], 55 y.o.   MRN: [id number]  HPI Pt returns for f/u of diabetes mellitus: DM type: 1 Dx'ed: 2015 Complications: painful polyneuropathy. Therapy: insulin since dx DKA: only at time of dx Severe hypoglycemia: never.  Pancreatitis: never Other: he declines pump rx; he takes multiple daily injections; he stopped drinking EtOH in 2010 Interval history: no cbg record, but states cbg's are still lowest (80) in the afternoon.  He says it is well-controlled in general. Past Medical History  Diagnosis Date  . ETOH abuse   . Cirrhosis (HCC)   . Diabetes (HCC)     type 1    Past Surgical History  Procedure Laterality Date  . Tonsillectomy    . Mouth surgery      on gums    Social History   Social History  . Marital Status: Divorced    Spouse Name: N/A  . Number of Children: 1  . Years of Education: N/A   Occupational History  . unemployed    Social History Main Topics  . Smoking status: Current Every Day Smoker -- 0.50 packs/day for 15 years    Types: Cigarettes  . Smokeless tobacco: Never Used     Comment: tobacco info given 03/14/14  . Alcohol Use: No  . Drug Use: No  . Sexual Activity: Not on file   Other Topics Concern  . Not on file   Social History Narrative    Current Outpatient Prescriptions on File Prior to Visit  Medication Sig Dispense Refill  . ACCU-CHEK AVIVA PLUS test strip USE TO CHECK BLOOD SUGARS 4 TIMES PER DAY. 150 each 3  . atorvastatin (LIPITOR) 20 MG tablet Take 1 tablet (20 mg total) by mouth daily at 6 PM. 30 tablet 0  . B-D UF III MINI PEN NEEDLES 31G X 5 MM MISC USE WITH INSULIN 4 TIMES PER DAY 100 each 3  . COMBIGAN 0.2-0.5 % ophthalmic solution   0  . DUREZOL 0.05 % EMUL   0  . gabapentin (NEURONTIN) 300 MG capsule Take 300 mg by mouth. 1 tab in the morning 2 in the evening    . ILEVRO 0.3 % ophthalmic suspension   0  . insulin aspart (NOVOLOG) 100 UNIT/ML FlexPen 2  units with breakfast, none with lunch, and 2 units with the evening meal. 15 mL 11  . LOTEMAX 0.5 % GEL   3  . Multiple Vitamin (MULTIVITAMIN WITH MINERALS) TABS tablet Take 1 tablet by mouth daily.    . prednisoLONE acetate (PRED FORTE) 1 % ophthalmic suspension   3  . RESTASIS 0.05 % ophthalmic emulsion   4  . spironolactone (ALDACTONE) 50 MG tablet Take 50 mg by mouth daily.     No current facility-administered medications on file prior to visit.    No Known Allergies  Family History  Problem Relation Age of Onset  . Colon cancer Neg Hx   . Heart disease Mother     bi-pass  . Hypertension Mother   . Cancer Brother     unsure type  . Hypertension Father     BP 108/82 mmHg  Pulse 70  Temp(Src) 97.9 F (36.6 C) (Oral)  Ht 5\' 11"  (1.803 m)  Wt 156 lb (70.761 kg)  BMI 21.77 kg/m2  SpO2 99%    Review of Systems He denies hypoglycemia    Objective:   Physical Exam VITAL SIGNS:  See vs  page GENERAL: no distress Pulses: dorsalis pedis intact bilat.   MSK: no deformity of the feet CV: no leg edema Skin:  no ulcer on the feet.  normal color and temp on the feet. Neuro: sensation is intact to touch on the feet    Lab Results  Component Value Date   HGBA1C 6.5 11/25/2015      Assessment & Plan:  Type 1 DM, still in partial remission.    Patient is advised the following: Patient Instructions  check your blood sugar 4 times a day: before the 3 meals, and at bedtime.  also check if you have symptoms of your blood sugar being too high or too low.  please keep a record of the readings and bring it to your next appointment here.  You can write it on any piece of paper.  please call us sooner if your blood sugar goes below 70, or if you have a lot of readings over 200. Please come back for a follow-up appointment in 3 months.   Please stop taking the lantus.  Please continue the same novolog.

## 2016-02-07 ENCOUNTER — Other Ambulatory Visit: Payer: Self-pay | Admitting: Endocrinology

## 2016-02-21 NOTE — Progress Notes (Signed)
Subjective:    Patient ID: Patrick [name], male    DOB: 07/16/1961, 55 y.o.   MRN: [id number]  HPI Pt returns for f/u of diabetes mellitus: DM type: 1 Dx'ed: 2015 Complications: painful polyneuropathy. Therapy: insulin since dx DKA: only at time of dx Severe hypoglycemia: never.  Pancreatitis: never Other: he declines pump rx; he takes multiple daily injections; he stopped drinking EtOH in 2010 Interval history: pt states he feels well in general (neuropathic pain is better).  no cbg record, but states cbg's are well-controlled. Past Medical History  Diagnosis Date  . ETOH abuse   . Cirrhosis (HCC)   . Diabetes (HCC)     type 1    Past Surgical History  Procedure Laterality Date  . Tonsillectomy    . Mouth surgery      on gums    Social History   Social History  . Marital Status: Divorced    Spouse Name: N/A  . Number of Children: 1  . Years of Education: N/A   Occupational History  . unemployed    Social History Main Topics  . Smoking status: Current Every Day Smoker -- 0.50 packs/day for 15 years    Types: Cigarettes  . Smokeless tobacco: Never Used     Comment: tobacco info given 03/14/14  . Alcohol Use: No  . Drug Use: No  . Sexual Activity: Not on file   Other Topics Concern  . Not on file   Social History Narrative    Current Outpatient Prescriptions on File Prior to Visit  Medication Sig Dispense Refill  . ACCU-CHEK AVIVA PLUS test strip USE TO CHECK BLOOD SUGARS 4 TIMES PER DAY. 100 each 3  . B-D UF III MINI PEN NEEDLES 31G X 5 MM MISC USE WITH INSULIN 4 TIMES PER DAY 100 each 3  . COMBIGAN 0.2-0.5 % ophthalmic solution Reported on 02/23/2016  0  . ILEVRO 0.3 % ophthalmic suspension   0  . insulin aspart (NOVOLOG) 100 UNIT/ML FlexPen 2 units with breakfast, none with lunch, and 2 units with the evening meal. 15 mL 11  . LOTEMAX 0.5 % GEL   3  . Multiple Vitamin (MULTIVITAMIN WITH MINERALS) TABS tablet Take 1 tablet by mouth daily.    Marland Kitchen.  atorvastatin (LIPITOR) 20 MG tablet Take 1 tablet (20 mg total) by mouth daily at 6 PM. (Patient not taking: Reported on 02/23/2016) 30 tablet 0  . DUREZOL 0.05 % EMUL Reported on 02/23/2016  0  . gabapentin (NEURONTIN) 300 MG capsule Take 300 mg by mouth. Reported on 02/23/2016    . prednisoLONE acetate (PRED FORTE) 1 % ophthalmic suspension Reported on 02/23/2016  3  . RESTASIS 0.05 % ophthalmic emulsion Reported on 02/23/2016  4  . spironolactone (ALDACTONE) 50 MG tablet Take 50 mg by mouth daily. Reported on 02/23/2016     No current facility-administered medications on file prior to visit.    No Known Allergies  Family History  Problem Relation Age of Onset  . Colon cancer Neg Hx   . Heart disease Mother     bi-pass  . Hypertension Mother   . Cancer Brother     unsure type  . Hypertension Father     BP 118/60 mmHg  Pulse 67  Temp(Src) 98 F (36.7 C) (Oral)  Ht 5\' 11"  (1.803 m)  Wt 149 lb (67.586 kg)  BMI 20.79 kg/m2  SpO2 98%  Review of Systems He denies hypoglycemia    Objective:  Physical Exam VITAL SIGNS:  See vs page GENERAL: no distress Pulses: dorsalis pedis intact bilat.   MSK: no deformity of the feet CV: no leg edema Skin:  no ulcer on the feet.  normal color and temp on the feet. Neuro: sensation is intact to touch on the feet, but decreased from normal. Ext: There is bilateral onychomycosis of the toenails   A1c=6.3%    Assessment & Plan:  Type 1 DM, in remission.  However, we'll continue this low dosage for now, as he is tolerating well.   Patient is advised the following: Patient Instructions  check your blood sugar 4 times a day: before the 3 meals, and at bedtime.  also check if you have symptoms of your blood sugar being too high or too low.  please keep a record of the readings and bring it to your next appointment here.  You can write it on any piece of paper.  please call us sooner if your blood sugar goes below 70, or if you have a lot of readings  over 200. Please come back for a follow-up appointment in 4 months.   Please continue the same novolog. However, with time, your insulin need will increase again. Therefore, please call if your blood sugar is over 200.

## 2016-02-21 NOTE — Patient Instructions (Addendum)
check your blood sugar 4 times a day: before the 3 meals, and at bedtime.  also check if you have symptoms of your blood sugar being too high or too low.  please keep a record of the readings and bring it to your next appointment here.  You can write it on any piece of paper.  please call us sooner if your blood sugar goes below 70, or if you have a lot of readings over 200. Please come back for a follow-up appointment in 4 months.   Please continue the same novolog. However, with time, your insulin need will increase again. Therefore, please call if your blood sugar is over 200.

## 2016-02-23 ENCOUNTER — Encounter: Payer: Self-pay | Admitting: Endocrinology

## 2016-02-23 ENCOUNTER — Ambulatory Visit (INDEPENDENT_AMBULATORY_CARE_PROVIDER_SITE_OTHER): Payer: Medicaid Other | Admitting: Endocrinology

## 2016-02-23 VITALS — BP 118/60 | HR 67 | Temp 98.0°F | Ht 71.0 in | Wt 149.0 lb

## 2016-02-23 DIAGNOSIS — E1042 Type 1 diabetes mellitus with diabetic polyneuropathy: Secondary | ICD-10-CM

## 2016-02-23 LAB — POCT GLYCOSYLATED HEMOGLOBIN (HGB A1C): HEMOGLOBIN A1C: 6.3

## 2016-04-19 ENCOUNTER — Other Ambulatory Visit: Payer: Self-pay | Admitting: Endocrinology

## 2016-07-04 ENCOUNTER — Encounter: Payer: Self-pay | Admitting: Endocrinology

## 2016-07-04 ENCOUNTER — Ambulatory Visit (INDEPENDENT_AMBULATORY_CARE_PROVIDER_SITE_OTHER): Payer: Medicaid Other | Admitting: Endocrinology

## 2016-07-04 VITALS — BP 117/66 | HR 80 | Temp 98.2°F | Ht 71.0 in | Wt 147.6 lb

## 2016-07-04 DIAGNOSIS — E1310 Other specified diabetes mellitus with ketoacidosis without coma: Secondary | ICD-10-CM | POA: Diagnosis not present

## 2016-07-04 LAB — POCT GLYCOSYLATED HEMOGLOBIN (HGB A1C): Hemoglobin A1C: 6.4

## 2016-07-04 NOTE — Progress Notes (Signed)
Subjective:    Patient ID: Patrick [name], male    DOB: 08/14/1961, 55 y.o.   MRN: [id number]  HPI Pt returns for f/u of diabetes mellitus: DM type: 1 Dx'ed: 2015 Complications: painful polyneuropathy. Therapy: insulin since dx DKA: only once, at time of dx Severe hypoglycemia: never.  Pancreatitis: never Other: he declines pump rx; he takes multiple daily injections; in 2017, he went into partial remission; he stopped drinking EtOH in 2010; CT in 2007 showed minimal pancreatic Ca++.  Interval history: pt states he feels well in general.  no cbg record, but states cbg's are well-controlled.   Past Medical History:  Diagnosis Date  . Cirrhosis (HCC)   . Diabetes (HCC)    type 1  . ETOH abuse     Past Surgical History:  Procedure Laterality Date  . MOUTH SURGERY     on gums  . TONSILLECTOMY      Social History   Social History  . Marital status: Divorced    Spouse name: N/A  . Number of children: 1  . Years of education: N/A   Occupational History  . unemployed    Social History Main Topics  . Smoking status: Current Every Day Smoker    Packs/day: 0.50    Years: 15.00    Types: Cigarettes  . Smokeless tobacco: Never Used     Comment: tobacco info given 03/14/14  . Alcohol use No  . Drug use: No  . Sexual activity: Not on file   Other Topics Concern  . Not on file   Social History Narrative  . No narrative on file    Current Outpatient Prescriptions on File Prior to Visit  Medication Sig Dispense Refill  . ACCU-CHEK AVIVA PLUS test strip USE TO CHECK BLOOD SUGARS 4 TIMES PER DAY. 100 each 3  . B-D UF III MINI PEN NEEDLES 31G X 5 MM MISC USE WITH INSULIN 4 TIMES PER DAY 100 each 3  . COMBIGAN 0.2-0.5 % ophthalmic solution Reported on 02/23/2016  0  . DUREZOL 0.05 % EMUL Reported on 02/23/2016  0  . gabapentin (NEURONTIN) 300 MG capsule Take 300 mg by mouth. Reported on 02/23/2016    . insulin aspart (NOVOLOG) 100 UNIT/ML FlexPen 2 units with breakfast, none  with lunch, and 2 units with the evening meal. 15 mL 11  . LOTEMAX 0.5 % GEL   3  . Multiple Vitamin (MULTIVITAMIN WITH MINERALS) TABS tablet Take 1 tablet by mouth daily.    Marland Kitchen. atorvastatin (LIPITOR) 20 MG tablet Take 1 tablet (20 mg total) by mouth daily at 6 PM. (Patient not taking: Reported on 07/04/2016) 30 tablet 0  . ILEVRO 0.3 % ophthalmic suspension   0  . prednisoLONE acetate (PRED FORTE) 1 % ophthalmic suspension Reported on 02/23/2016  3  . RESTASIS 0.05 % ophthalmic emulsion Reported on 02/23/2016  4  . spironolactone (ALDACTONE) 50 MG tablet Take 50 mg by mouth daily. Reported on 02/23/2016     No current facility-administered medications on file prior to visit.     No Known Allergies  Family History  Problem Relation Age of Onset  . Colon cancer Neg Hx   . Heart disease Mother     bi-pass  . Hypertension Mother   . Cancer Brother     unsure type  . Hypertension Father     BP 117/66   Pulse 80   Temp 98.2 F (36.8 C) (Oral)   Ht 5\' 11"  (1.803 m)  Wt 147 lb 9.6 oz (67 kg)   SpO2 98%   BMI 20.59 kg/m    Review of Systems He denies hypoglycemia.     Objective:   Physical Exam VITAL SIGNS:  See vs page GENERAL: no distress Pulses: dorsalis pedis intact bilat.   MSK: no deformity of the feet CV: no leg edema Skin:  no ulcer on the feet.  normal color and temp on the feet. Neuro: sensation is intact to touch on the feet, but decreased from normal.    Lab Results  Component Value Date   HGBA1C 6.4 07/04/2016      Assessment & Plan:  Type 1 DM vs due to pancreatitis: in partial remission (which favors a dx of type 1 DM)

## 2016-07-04 NOTE — Patient Instructions (Addendum)
check your blood sugar twice a day: vary the time of day when you check, between before the 3 meals, and at bedtime.  also check if you have symptoms of your blood sugar being too high or too low.  please keep a record of the readings and bring it to your next appointment here (or you can bring the meter itself).  You can write it on any piece of paper.  please call us sooner if your blood sugar goes below 70, or if you have a lot of readings over 200.   Please come back for a follow-up appointment in 6 months.   Please continue the same novolog. However, with time, your insulin need will increase again. Therefore, please call if your blood sugar is over 200.

## 2016-07-04 NOTE — Progress Notes (Signed)
Pre visit review using our clinic tool,if applicable. No additional management support is needed unless otherwise documented below in the visit note.  

## 2016-07-24 ENCOUNTER — Other Ambulatory Visit: Payer: Self-pay | Admitting: Endocrinology

## 2016-07-26 ENCOUNTER — Telehealth: Payer: Self-pay | Admitting: Endocrinology

## 2016-07-26 NOTE — Telephone Encounter (Signed)
Pt states that medicaid is not covering the accuchek anymore he does not know what will be covered

## 2016-07-26 NOTE — Telephone Encounter (Signed)
Attempted to reach the patient. Patient was unavailable. Will try again at a later time.  

## 2016-07-28 MED ORDER — GLUCOSE BLOOD VI STRP: ORAL_STRIP | 2 refills | Status: DC

## 2016-07-28 MED ORDER — ONETOUCH DELICA LANCETS 33G MISC: 2 refills | Status: DC

## 2016-07-28 NOTE — Telephone Encounter (Signed)
I contacted the patient and advised we have submitted a prescription for onetouch verio to replace the Accu-chek he was using. Patient advised he could come by the office and pick his meter up and then receive the test strips and lancets from his local pharmacy. Patient verbalized understanding.

## 2016-09-06 ENCOUNTER — Other Ambulatory Visit: Payer: Self-pay | Admitting: Endocrinology

## 2016-11-04 ENCOUNTER — Other Ambulatory Visit: Payer: Self-pay | Admitting: Endocrinology

## 2017-01-04 ENCOUNTER — Encounter: Payer: Self-pay | Admitting: Endocrinology

## 2017-01-04 ENCOUNTER — Ambulatory Visit (INDEPENDENT_AMBULATORY_CARE_PROVIDER_SITE_OTHER): Payer: Medicaid Other | Admitting: Endocrinology

## 2017-01-04 VITALS — BP 98/62 | HR 79 | Ht 71.0 in | Wt 157.0 lb

## 2017-01-04 DIAGNOSIS — E1042 Type 1 diabetes mellitus with diabetic polyneuropathy: Secondary | ICD-10-CM | POA: Diagnosis not present

## 2017-01-04 DIAGNOSIS — Z23 Encounter for immunization: Secondary | ICD-10-CM | POA: Diagnosis not present

## 2017-01-04 LAB — POCT GLYCOSYLATED HEMOGLOBIN (HGB A1C): Hemoglobin A1C: 6.2

## 2017-01-04 NOTE — Patient Instructions (Addendum)
check your blood sugar twice a day: vary the time of day when you check, between before the 3 meals, and at bedtime.  also check if you have symptoms of your blood sugar being too high or too low.  please keep a record of the readings and bring it to your next appointment here (or you can bring the meter itself).  You can write it on any piece of paper.  please call us sooner if your blood sugar goes below 70, or if you have a lot of readings over 200.   Please stop taking the novolog.  Please come back for a follow-up appointment in 3 months.   with time, your insulin need will return. Therefore, please call if your blood sugar is over 200.

## 2017-01-04 NOTE — Progress Notes (Signed)
Subjective:    Patient ID: Patrick [name], male    DOB: 03-26-1961, 56 y.o.   MRN: [id number]  HPI Pt returns for f/u of diabetes mellitus:   DM type: 1 Dx'ed: 2015.  Complications: painful polyneuropathy.   Therapy: insulin since dx DKA: only once, at time of dx.  Severe hypoglycemia: never.  Pancreatitis: never.  Other: he declines pump rx; he takes multiple daily injections; in 2017, he went into partial remission; he stopped drinking EtOH in 2010; CT in 2007 showed minimal pancreatic Ca++.  Interval history: pt states he feels well in general.  no cbg record, but states cbg's are well-controlled.   Past Medical History:  Diagnosis Date  . Cirrhosis (HCC)   . Diabetes (HCC)    type 1  . ETOH abuse     Past Surgical History:  Procedure Laterality Date  . MOUTH SURGERY     on gums  . TONSILLECTOMY      Social History   Social History  . Marital status: Divorced    Spouse name: N/A  . Number of children: 1  . Years of education: N/A   Occupational History  . unemployed    Social History Main Topics  . Smoking status: Current Every Day Smoker    Packs/day: 0.50    Years: 15.00    Types: Cigarettes  . Smokeless tobacco: Never Used     Comment: tobacco info given 03/14/14  . Alcohol use No  . Drug use: No  . Sexual activity: Not on file   Other Topics Concern  . Not on file   Social History Narrative  . No narrative on file    Current Outpatient Prescriptions on File Prior to Visit  Medication Sig Dispense Refill  . ACCU-CHEK AVIVA PLUS test strip USE TO CHECK BLOOD SUGARS 4 TIMES PER DAY. 360 each 3  . B-D UF III MINI PEN NEEDLES 31G X 5 MM MISC USE WITH INSULIN 4 TIMES PER DAY 100 each 3  . COMBIGAN 0.2-0.5 % ophthalmic solution Reported on 02/23/2016  0  . glucose blood (ONETOUCH VERIO) test strip USE TO CHECK BLOOD SUGARS 4 TIMES PER DAY. 200 each 2  . insulin aspart (NOVOLOG) 100 UNIT/ML FlexPen 2 units with breakfast, none with lunch, and 2 units  with the evening meal. 15 mL 11  . Multiple Vitamin (MULTIVITAMIN WITH MINERALS) TABS tablet Take 1 tablet by mouth daily.    Letta Pate DELICA LANCETS 33G MISC USE TO CHECK BLOOD SUGARS 4 TIMES PER DAY. 200 each 2  . prednisoLONE acetate (PRED FORTE) 1 % ophthalmic suspension Reported on 02/23/2016  3   No current facility-administered medications on file prior to visit.     No Known Allergies  Family History  Problem Relation Age of Onset  . Colon cancer Neg Hx   . Heart disease Mother     bi-pass  . Hypertension Mother   . Cancer Brother     unsure type  . Hypertension Father     BP 98/62   Pulse 79   Ht 5\' 11"  (1.803 m)   Wt 157 lb (71.2 kg)   SpO2 97%   BMI 21.90 kg/m    Review of Systems He denies hypoglycemia.      Objective:   Physical Exam VITAL SIGNS:  See vs page GENERAL: no distress Pulses: dorsalis pedis intact bilat.   MSK: no deformity of the feet CV: no leg edema Skin:  no ulcer on the  feet.  normal color and temp on the feet. Neuro: sensation is intact to touch on the feet, but decreased from normal.   Ext: There is bilateral onychomycosis of the toenails.    A1c=6.2%     Assessment & Plan:  Type 1 DM, in remission.  Patient is advised the following: Patient Instructions  check your blood sugar twice a day: vary the time of day when you check, between before the 3 meals, and at bedtime.  also check if you have symptoms of your blood sugar being too high or too low.  please keep a record of the readings and bring it to your next appointment here (or you can bring the meter itself).  You can write it on any piece of paper.  please call us sooner if your blood sugar goes below 70, or if you have a lot of readings over 200.   Please stop taking the novolog.  Please come back for a follow-up appointment in 3 months.   with time, your insulin need will return. Therefore, please call if your blood sugar is over 200.

## 2017-04-03 ENCOUNTER — Encounter: Payer: Self-pay | Admitting: Endocrinology

## 2017-04-03 ENCOUNTER — Ambulatory Visit (INDEPENDENT_AMBULATORY_CARE_PROVIDER_SITE_OTHER): Payer: Medicaid Other | Admitting: Endocrinology

## 2017-04-03 VITALS — BP 110/70 | HR 78 | Ht 71.0 in | Wt 149.0 lb

## 2017-04-03 DIAGNOSIS — E1042 Type 1 diabetes mellitus with diabetic polyneuropathy: Secondary | ICD-10-CM

## 2017-04-03 LAB — POCT GLYCOSYLATED HEMOGLOBIN (HGB A1C): Hemoglobin A1C: 6.5

## 2017-04-03 NOTE — Progress Notes (Signed)
Subjective:    Patient ID: Patrick [name], male    DOB: Jul 07, 1961, 56 y.o.   MRN: [id number]  HPI Pt returns for f/u of diabetes mellitus:   DM type: 1, in remission.  Dx'ed: 2015.  Complications: painful polyneuropathy.   Therapy: no medication now DKA: only once, at time of dx.  Severe hypoglycemia: never.  Pancreatitis: never.  Other: in 2017, he went into partial remission; he has been off insulin since early 2018; he stopped drinking EtOH in 2010; CT in 2007 showed minimal pancreatic Ca++.  Interval history: pt states he feels well in general.  no cbg record, but states cbg's are well-controlled.   Past Medical History:  Diagnosis Date  . Cirrhosis (HCC)   . Diabetes (HCC)    type 1  . ETOH abuse     Past Surgical History:  Procedure Laterality Date  . MOUTH SURGERY     on gums  . TONSILLECTOMY      Social History   Social History  . Marital status: Divorced    Spouse name: N/A  . Number of children: 1  . Years of education: N/A   Occupational History  . unemployed    Social History Main Topics  . Smoking status: Current Every Day Smoker    Packs/day: 0.50    Years: 15.00    Types: Cigarettes  . Smokeless tobacco: Never Used     Comment: tobacco info given 03/14/14  . Alcohol use No  . Drug use: No  . Sexual activity: Not on file   Other Topics Concern  . Not on file   Social History Narrative  . No narrative on file    Current Outpatient Prescriptions on File Prior to Visit  Medication Sig Dispense Refill  . ACCU-CHEK AVIVA PLUS test strip USE TO CHECK BLOOD SUGARS 4 TIMES PER DAY. 360 each 3  . glucose blood (ONETOUCH VERIO) test strip USE TO CHECK BLOOD SUGARS 4 TIMES PER DAY. 200 each 2  . Multiple Vitamin (MULTIVITAMIN WITH MINERALS) TABS tablet Take 1 tablet by mouth daily.    Letta Pate DELICA LANCETS 33G MISC USE TO CHECK BLOOD SUGARS 4 TIMES PER DAY. 200 each 2  . prednisoLONE acetate (PRED FORTE) 1 % ophthalmic suspension Reported on  02/23/2016  3  . B-D UF III MINI PEN NEEDLES 31G X 5 MM MISC USE WITH INSULIN 4 TIMES PER DAY (Patient not taking: Reported on 04/03/2017) 100 each 3  . COMBIGAN 0.2-0.5 % ophthalmic solution Reported on 02/23/2016  0  . insulin aspart (NOVOLOG) 100 UNIT/ML FlexPen 2 units with breakfast, none with lunch, and 2 units with the evening meal. (Patient not taking: Reported on 04/03/2017) 15 mL 11   No current facility-administered medications on file prior to visit.     No Known Allergies  Family History  Problem Relation Age of Onset  . Colon cancer Neg Hx   . Heart disease Mother        bi-pass  . Hypertension Mother   . Cancer Brother        unsure type  . Hypertension Father     BP 110/70   Pulse 78   Ht 5\' 11"  (1.803 m)   Wt 149 lb (67.6 kg)   SpO2 98%   BMI 20.78 kg/m    Review of Systems No weight change    Objective:   Physical Exam VITAL SIGNS:  See vs page GENERAL: no distress Pulses: dorsalis pedis intact bilat.  MSK: no deformity of the feet CV: no leg edema Skin:  no ulcer on the feet.  normal color and temp on the feet. Neuro: sensation is intact to touch on the feet, but decreased from normal.   Ext: There is bilateral onychomycosis of the toenails.   A1c=6.5%    Assessment & Plan:  Type 1 DM, with polyneuropathy: in remission.  Chronic pancreatitis: this prob contributes to DM.   Patient Instructions  check your blood sugar once a day: vary the time of day when you check, between before the 3 meals, and at bedtime.  also check if you have symptoms of your blood sugar being too high or too low.  please keep a record of the readings and bring it to your next appointment here (or you can bring the meter itself).  You can write it on any piece of paper.  please call us sooner if your blood sugar goes below 70, or if you have a lot of readings over 200.   No medication is needed now, but the need for medication will return at some point.  Therefore, please call  if your blood sugar is over 200. Please come back for a follow-up appointment in 4 months.

## 2017-04-03 NOTE — Patient Instructions (Addendum)
check your blood sugar once a day: vary the time of day when you check, between before the 3 meals, and at bedtime.  also check if you have symptoms of your blood sugar being too high or too low.  please keep a record of the readings and bring it to your next appointment here (or you can bring the meter itself).  You can write it on any piece of paper.  please call us sooner if your blood sugar goes below 70, or if you have a lot of readings over 200.   No medication is needed now, but the need for medication will return at some point.  Therefore, please call if your blood sugar is over 200. Please come back for a follow-up appointment in 4 months.

## 2017-08-04 ENCOUNTER — Encounter: Payer: Self-pay | Admitting: Endocrinology

## 2017-08-04 ENCOUNTER — Ambulatory Visit (INDEPENDENT_AMBULATORY_CARE_PROVIDER_SITE_OTHER): Payer: Medicaid Other | Admitting: Endocrinology

## 2017-08-04 VITALS — BP 102/66 | HR 65 | Wt 144.6 lb

## 2017-08-04 DIAGNOSIS — E1042 Type 1 diabetes mellitus with diabetic polyneuropathy: Secondary | ICD-10-CM

## 2017-08-04 LAB — POCT GLYCOSYLATED HEMOGLOBIN (HGB A1C): HEMOGLOBIN A1C: 6.3

## 2017-08-04 NOTE — Patient Instructions (Signed)
check your blood sugar once a week: vary the time of day when you check, between before the 3 meals, and at bedtime.  also check if you have symptoms of your blood sugar being too high or too low.  please keep a record of the readings and bring it to your next appointment here (or you can bring the meter itself).  You can write it on any piece of paper.  please call us sooner if your blood sugar goes below 70, or if you have a lot of readings over 200.   No medication is needed now, but the need for medication will return at some point.  Therefore, please call if your blood sugar is over 200.  Please come back for a follow-up appointment in 4-6 months.

## 2017-08-04 NOTE — Progress Notes (Signed)
Subjective:    Patient ID: Patrick [name], male    DOB: 22-Nov-1960, 56 y.o.   MRN: [id number]  HPI Pt returns for f/u of diabetes mellitus:   DM type: 1, in remission.  Dx'ed: 2015.  Complications: painful polyneuropathy.   Therapy: no medication now DKA: only once, at time of dx.  Severe hypoglycemia: never.  Pancreatitis: never.  Other: in 2017, he went into partial remission; he has been off insulin since early 2018; he stopped drinking EtOH in 2010; CT in 2007 showed minimal pancreatic Ca++.  Interval history: pt states he feels well in general.  no cbg record, but states cbg's are well-controlled.     Past Medical History:  Diagnosis Date  . Cirrhosis (HCC)   . Diabetes (HCC)    type 1  . ETOH abuse     Past Surgical History:  Procedure Laterality Date  . MOUTH SURGERY     on gums  . TONSILLECTOMY      Social History   Social History  . Marital status: Divorced    Spouse name: N/A  . Number of children: 1  . Years of education: N/A   Occupational History  . unemployed    Social History Main Topics  . Smoking status: Current Every Day Smoker    Packs/day: 0.50    Years: 15.00    Types: Cigarettes  . Smokeless tobacco: Never Used     Comment: tobacco info given 03/14/14  . Alcohol use No  . Drug use: No  . Sexual activity: Not on file   Other Topics Concern  . Not on file   Social History Narrative  . No narrative on file    Current Outpatient Prescriptions on File Prior to Visit  Medication Sig Dispense Refill  . ACCU-CHEK AVIVA PLUS test strip USE TO CHECK BLOOD SUGARS 4 TIMES PER DAY. 360 each 3  . B-D UF III MINI PEN NEEDLES 31G X 5 MM MISC USE WITH INSULIN 4 TIMES PER DAY 100 each 3  . Multiple Vitamin (MULTIVITAMIN WITH MINERALS) TABS tablet Take 1 tablet by mouth daily.    Letta Pate DELICA LANCETS 33G MISC USE TO CHECK BLOOD SUGARS 4 TIMES PER DAY. 200 each 2  . COMBIGAN 0.2-0.5 % ophthalmic solution Reported on 02/23/2016  0  . glucose  blood (ONETOUCH VERIO) test strip USE TO CHECK BLOOD SUGARS 4 TIMES PER DAY. (Patient not taking: Reported on 08/04/2017) 200 each 2  . insulin aspart (NOVOLOG) 100 UNIT/ML FlexPen 2 units with breakfast, none with lunch, and 2 units with the evening meal. (Patient not taking: Reported on 04/03/2017) 15 mL 11  . prednisoLONE acetate (PRED FORTE) 1 % ophthalmic suspension Reported on 02/23/2016  3   No current facility-administered medications on file prior to visit.     No Known Allergies  Family History  Problem Relation Age of Onset  . Colon cancer Neg Hx   . Heart disease Mother        bi-pass  . Hypertension Mother   . Cancer Brother        unsure type  . Hypertension Father    BP 102/66   Pulse 65   Wt 144 lb 9.6 oz (65.6 kg)   SpO2 97%   BMI 20.17 kg/m   Review of Systems No weight change    Objective:   Physical Exam VITAL SIGNS:  See vs page GENERAL: no distress Pulses: foot pulses are intact bilaterally.   MSK: no  deformity of the feet or ankles.  CV: no edema of the legs or ankles Skin:  no ulcer on the feet or ankles.  normal color and temp on the feet and ankles Neuro: sensation is intact to touch on the feet and ankles.    Lab Results  Component Value Date   HGBA1C 6.3 08/04/2017      Assessment & Plan:  Type 1 DM: in remission. Chronic pancreatitis: this may also contribute to DM.   Patient Instructions  check your blood sugar once a week: vary the time of day when you check, between before the 3 meals, and at bedtime.  also check if you have symptoms of your blood sugar being too high or too low.  please keep a record of the readings and bring it to your next appointment here (or you can bring the meter itself).  You can write it on any piece of paper.  please call us sooner if your blood sugar goes below 70, or if you have a lot of readings over 200.   No medication is needed now, but the need for medication will return at some point.  Therefore, please  call if your blood sugar is over 200.  Please come back for a follow-up appointment in 4-6 months.

## 2017-10-06 ENCOUNTER — Encounter: Payer: Self-pay | Admitting: Endocrinology

## 2017-10-06 ENCOUNTER — Ambulatory Visit (INDEPENDENT_AMBULATORY_CARE_PROVIDER_SITE_OTHER): Payer: Medicaid Other

## 2017-10-06 DIAGNOSIS — Z23 Encounter for immunization: Secondary | ICD-10-CM

## 2018-02-01 ENCOUNTER — Ambulatory Visit: Payer: Medicaid Other | Admitting: Endocrinology

## 2018-02-01 ENCOUNTER — Encounter: Payer: Self-pay | Admitting: Endocrinology

## 2018-02-01 VITALS — BP 108/78 | HR 79 | Wt 152.6 lb

## 2018-02-01 DIAGNOSIS — E1042 Type 1 diabetes mellitus with diabetic polyneuropathy: Secondary | ICD-10-CM

## 2018-02-01 LAB — POCT GLYCOSYLATED HEMOGLOBIN (HGB A1C): Hemoglobin A1C: 6.7

## 2018-02-01 NOTE — Patient Instructions (Signed)
No medication for the diabetes is needed now. check your blood sugar once a day.  vary the time of day when you check, between before the 3 meals, and at bedtime.  also check if you have symptoms of your blood sugar being too high or too low.  please keep a record of the readings and bring it to your next appointment here (or you can bring the meter itself).  You can write it on any piece of paper.  please call us sooner if your blood sugar goes below 70, or if you have a lot of readings over 200. With time, your need for insulin will return, so please call if your blood sugar goes over 200.

## 2018-02-01 NOTE — Progress Notes (Signed)
Subjective:    Patient ID: Patrick [name], male    DOB: 03-12-1961, 57 y.o.   MRN: [id number]  HPI Pt returns for f/u of diabetes mellitus:   DM type: 1, in remission.  Dx'ed: 2015.  Complications: painful polyneuropathy.   Therapy: no medication now DKA: only once, at time of dx.  Severe hypoglycemia: never.  Pancreatitis: never.  Other: in 2017, he went into partial remission; he has been off insulin since early 2018; he stopped drinking EtOH in 2010; CT in 2007 showed pancreatic Ca++.  Interval history: pt states he feels well in general.  no cbg record, but states cbg's are well-controlled.  Past Medical History:  Diagnosis Date  . Cirrhosis (HCC)   . Diabetes (HCC)    type 1  . ETOH abuse     Past Surgical History:  Procedure Laterality Date  . MOUTH SURGERY     on gums  . TONSILLECTOMY      Social History   Socioeconomic History  . Marital status: Divorced    Spouse name: Not on file  . Number of children: 1  . Years of education: Not on file  . Highest education level: Not on file  Social Needs  . Financial resource strain: Not on file  . Food insecurity - worry: Not on file  . Food insecurity - inability: Not on file  . Transportation needs - medical: Not on file  . Transportation needs - non-medical: Not on file  Occupational History  . Occupation: unemployed  Tobacco Use  . Smoking status: Current Every Day Smoker    Packs/day: 0.50    Years: 15.00    Pack years: 7.50    Types: Cigarettes  . Smokeless tobacco: Never Used  . Tobacco comment: tobacco info given 03/14/14  Substance and Sexual Activity  . Alcohol use: No  . Drug use: No  . Sexual activity: Not on file  Other Topics Concern  . Not on file  Social History Narrative  . Not on file    Current Outpatient Medications on File Prior to Visit  Medication Sig Dispense Refill  . Multiple Vitamin (MULTIVITAMIN WITH MINERALS) TABS tablet Take 1 tablet by mouth daily.    Marland Kitchen ACCU-CHEK AVIVA  PLUS test strip USE TO CHECK BLOOD SUGARS 4 TIMES PER DAY. (Patient not taking: Reported on 02/01/2018) 360 each 3  . COMBIGAN 0.2-0.5 % ophthalmic solution Reported on 02/23/2016  0  . ONETOUCH DELICA LANCETS 33G MISC USE TO CHECK BLOOD SUGARS 4 TIMES PER DAY. (Patient not taking: Reported on 02/01/2018) 200 each 2  . prednisoLONE acetate (PRED FORTE) 1 % ophthalmic suspension Reported on 02/23/2016  3   No current facility-administered medications on file prior to visit.     No Known Allergies  Family History  Problem Relation Age of Onset  . Colon cancer Neg Hx   . Heart disease Mother        bi-pass  . Hypertension Mother   . Cancer Brother        unsure type  . Hypertension Father     BP 108/78 (BP Location: Left Arm, Patient Position: Sitting, Cuff Size: Normal)   Pulse 79   Wt 152 lb 9.6 oz (69.2 kg)   SpO2 98%   BMI 21.28 kg/m    Review of Systems No weight change    Objective:   Physical Exam VITAL SIGNS:  See vs page GENERAL: no distress Pulses: dorsalis pedis intact bilat.   MSK:  no deformity of the feet CV: no leg edema Skin:  no ulcer on the feet.  normal color and temp on the feet. Neuro: sensation is intact to touch on the feet, but decreased from normal.  Ext: There is bilateral onychomycosis of the toenails.    A1c=6.7%     Assessment & Plan:  Type 1 DM, in remission, worse.  We discussed he may need to resume insulin soon.   Patient Instructions  No medication for the diabetes is needed now. check your blood sugar once a day.  vary the time of day when you check, between before the 3 meals, and at bedtime.  also check if you have symptoms of your blood sugar being too high or too low.  please keep a record of the readings and bring it to your next appointment here (or you can bring the meter itself).  You can write it on any piece of paper.  please call us sooner if your blood sugar goes below 70, or if you have a lot of readings over 200. With time,  your need for insulin will return, so please call if your blood sugar goes over 200.

## 2018-02-22 ENCOUNTER — Other Ambulatory Visit: Payer: Self-pay | Admitting: Endocrinology

## 2018-05-14 ENCOUNTER — Encounter: Payer: Self-pay | Admitting: Endocrinology

## 2018-05-14 ENCOUNTER — Ambulatory Visit (INDEPENDENT_AMBULATORY_CARE_PROVIDER_SITE_OTHER): Payer: Medicaid Other | Admitting: Endocrinology

## 2018-05-14 VITALS — BP 102/78 | HR 76 | Wt 152.2 lb

## 2018-05-14 DIAGNOSIS — E1042 Type 1 diabetes mellitus with diabetic polyneuropathy: Secondary | ICD-10-CM

## 2018-05-14 LAB — POCT GLYCOSYLATED HEMOGLOBIN (HGB A1C): Hemoglobin A1C: 7 % — AB (ref 4.0–5.6)

## 2018-05-14 NOTE — Progress Notes (Signed)
Subjective:    Patient ID: Patrick [name], male    DOB: [birth year], 57 y.o.   MRN: [id number]  HPI Pt returns for f/u of diabetes mellitus:   DM type: 1, vs chronic pancreatitis, now in remission.  Dx'ed: 2015.  Complications: painful polyneuropathy.   Therapy: no medication now.   DKA: only once, at time of dx.  Severe hypoglycemia: never.  Pancreatitis: never.  Other: in 2017, he went into partial remission; he has been off insulin since early 2018; he stopped drinking EtOH in 2010; CT in 2007 showed pancreatic Ca++.   Interval history: pt states he feels well in general.  no cbg record, but states cbg's are in the low-100's.   Past Medical History:  Diagnosis Date  . Cirrhosis (HCC)   . Diabetes (HCC)    type 1  . ETOH abuse     Past Surgical History:  Procedure Laterality Date  . MOUTH SURGERY     on gums  . TONSILLECTOMY      Social History   Socioeconomic History  . Marital status: Divorced    Spouse name: Not on file  . Number of children: 1  . Years of education: Not on file  . Highest education level: Not on file  Occupational History  . Occupation: unemployed  Social Needs  . Financial resource strain: Not on file  . Food insecurity:    Worry: Not on file    Inability: Not on file  . Transportation needs:    Medical: Not on file    Non-medical: Not on file  Tobacco Use  . Smoking status: Current Every Day Smoker    Packs/day: 0.50    Years: 15.00    Pack years: 7.50    Types: Cigarettes  . Smokeless tobacco: Never Used  . Tobacco comment: tobacco info given 03/14/14  Substance and Sexual Activity  . Alcohol use: No  . Drug use: No  . Sexual activity: Not on file  Lifestyle  . Physical activity:    Days per week: Not on file    Minutes per session: Not on file  . Stress: Not on file  Relationships  . Social connections:    Talks on phone: Not on file    Gets together: Not on file    Attends religious service: Not on file    Active member  of club or organization: Not on file    Attends meetings of clubs or organizations: Not on file    Relationship status: Not on file  . Intimate partner violence:    Fear of current or ex partner: Not on file    Emotionally abused: Not on file    Physically abused: Not on file    Forced sexual activity: Not on file  Other Topics Concern  . Not on file  Social History Narrative  . Not on file    Current Outpatient Medications on File Prior to Visit  Medication Sig Dispense Refill  . ACCU-CHEK AVIVA PLUS test strip USE TO CHECK BLOOD SUGARS 4 TIMES PER DAY. 400 each 1  . COMBIGAN 0.2-0.5 % ophthalmic solution Reported on 02/23/2016  0  . Multiple Vitamin (MULTIVITAMIN WITH MINERALS) TABS tablet Take 1 tablet by mouth daily.    Letta Pate DELICA LANCETS 33G MISC USE TO CHECK BLOOD SUGARS 4 TIMES PER DAY. 200 each 2  . prednisoLONE acetate (PRED FORTE) 1 % ophthalmic suspension Reported on 02/23/2016  3   No current facility-administered medications on  file prior to visit.     No Known Allergies  Family History  Problem Relation Age of Onset  . Colon cancer Neg Hx   . Heart disease Mother        bi-pass  . Hypertension Mother   . Cancer Brother        unsure type  . Hypertension Father     BP 102/78   Pulse 76   Wt 152 lb 3.2 oz (69 kg)   SpO2 99%   BMI 21.23 kg/m   Review of Systems No weight change.     Objective:   Physical Exam VITAL SIGNS:  See vs page GENERAL: no distress Pulses: dorsalis pedis intact bilat.   MSK: no deformity of the feet CV: no leg edema Skin:  no ulcer on the feet.  normal color and temp on the feet. Neuro: sensation is intact to touch on the feet, but decreased from normal Ext: There is bilateral onychomycosis of the toenails.    A1c=7.0%    Assessment & Plan:  DM (type 1 vs due to pancreatitis), with polyneuropathy, worse: he may need to resume insulin soon  Patient Instructions  No medication for the diabetes is needed now.   However, we may need to resume the insulin soon.   check your blood sugar once a day.  vary the time of day when you check, between before the 3 meals, and at bedtime.  also check if you have symptoms of your blood sugar being too high or too low.  please keep a record of the readings and bring it to your next appointment here (or you can bring the meter itself).  You can write it on any piece of paper.  please call us sooner if your blood sugar goes below 70, or if you have a lot of readings over 200. With time, your need for insulin will return, so please call if your blood sugar goes over 200.  This is important, to prevent you from having to go back to the hospital.   Please come back for a follow-up appointment in 2 months.

## 2018-05-14 NOTE — Patient Instructions (Addendum)
No medication for the diabetes is needed now.  However, we may need to resume the insulin soon.   check your blood sugar once a day.  vary the time of day when you check, between before the 3 meals, and at bedtime.  also check if you have symptoms of your blood sugar being too high or too low.  please keep a record of the readings and bring it to your next appointment here (or you can bring the meter itself).  You can write it on any piece of paper.  please call us sooner if your blood sugar goes below 70, or if you have a lot of readings over 200. With time, your need for insulin will return, so please call if your blood sugar goes over 200.  This is important, to prevent you from having to go back to the hospital.   Please come back for a follow-up appointment in 2 months.

## 2018-07-16 ENCOUNTER — Ambulatory Visit: Payer: Medicaid Other | Admitting: Endocrinology

## 2018-07-16 ENCOUNTER — Encounter: Payer: Self-pay | Admitting: Endocrinology

## 2018-07-16 VITALS — BP 122/74 | HR 69 | Temp 97.6°F | Resp 16 | Wt 151.0 lb

## 2018-07-16 DIAGNOSIS — E1042 Type 1 diabetes mellitus with diabetic polyneuropathy: Secondary | ICD-10-CM

## 2018-07-16 LAB — POCT GLYCOSYLATED HEMOGLOBIN (HGB A1C): Hemoglobin A1C: 6.7 % — AB (ref 4.0–5.6)

## 2018-07-16 NOTE — Progress Notes (Signed)
Subjective:    Patient ID: Patrick [name], male    DOB: 08/20/1961, 57 y.o.   MRN: [id number]  HPI Pt returns for f/u of diabetes mellitus:   DM type: 1, vs chronic pancreatitis, now in remission.  Dx'ed: 2015.  Complications: painful polyneuropathy.   Therapy: no medication now.   DKA: only once, at time of dx.  Severe hypoglycemia: never.  Pancreatitis: never.  Other: in 2017, he went into partial remission; he has been off insulin since early 2018; he stopped drinking EtOH in 2010; CT in 2007 showed pancreatic Ca++.   Interval history: pt states he feels well in general.  pt states cbg's are in the low-100's.  Past Medical History:  Diagnosis Date  . Cirrhosis (HCC)   . Diabetes (HCC)    type 1  . ETOH abuse     Past Surgical History:  Procedure Laterality Date  . MOUTH SURGERY     on gums  . TONSILLECTOMY      Social History   Socioeconomic History  . Marital status: Divorced    Spouse name: Not on file  . Number of children: 1  . Years of education: Not on file  . Highest education level: Not on file  Occupational History  . Occupation: unemployed  Social Needs  . Financial resource strain: Not on file  . Food insecurity:    Worry: Not on file    Inability: Not on file  . Transportation needs:    Medical: Not on file    Non-medical: Not on file  Tobacco Use  . Smoking status: Current Every Day Smoker    Packs/day: 0.50    Years: 15.00    Pack years: 7.50    Types: Cigarettes  . Smokeless tobacco: Never Used  . Tobacco comment: tobacco info given 03/14/14  Substance and Sexual Activity  . Alcohol use: No  . Drug use: No  . Sexual activity: Not on file  Lifestyle  . Physical activity:    Days per week: Not on file    Minutes per session: Not on file  . Stress: Not on file  Relationships  . Social connections:    Talks on phone: Not on file    Gets together: Not on file    Attends religious service: Not on file    Active member of club or  organization: Not on file    Attends meetings of clubs or organizations: Not on file    Relationship status: Not on file  . Intimate partner violence:    Fear of current or ex partner: Not on file    Emotionally abused: Not on file    Physically abused: Not on file    Forced sexual activity: Not on file  Other Topics Concern  . Not on file  Social History Narrative  . Not on file    Current Outpatient Medications on File Prior to Visit  Medication Sig Dispense Refill  . ACCU-CHEK AVIVA PLUS test strip USE TO CHECK BLOOD SUGARS 4 TIMES PER DAY. 400 each 1  . Multiple Vitamin (MULTIVITAMIN WITH MINERALS) TABS tablet Take 1 tablet by mouth daily.    Letta Pate. ONETOUCH DELICA LANCETS 33G MISC USE TO CHECK BLOOD SUGARS 4 TIMES PER DAY. 200 each 2  . prednisoLONE acetate (PRED FORTE) 1 % ophthalmic suspension Reported on 02/23/2016  3   No current facility-administered medications on file prior to visit.     No Known Allergies  Family History  Problem  Relation Age of Onset  . Colon cancer Neg Hx   . Heart disease Mother        bi-pass  . Hypertension Mother   . Cancer Brother        unsure type  . Hypertension Father     BP 122/74   Pulse 69   Temp 97.6 F (36.4 C) (Oral)   Resp 16   Wt 151 lb (68.5 kg)   SpO2 97%   BMI 21.06 kg/m    Review of Systems He denies hypoglycemia.      Objective:   Physical Exam VITAL SIGNS:  See vs page GENERAL: no distress Pulses: foot pulses are intact bilaterally.   MSK: no deformity of the feet or ankles.  CV: no edema of the legs or ankles Skin:  no ulcer on the feet or ankles.  normal color and temp on the feet and ankles.  Neuro: sensation is intact to touch on the feet and ankles, but decreased from normal.   Ext: There is bilateral onychomycosis of the toenails.     Lab Results  Component Value Date   HGBA1C 6.7 (A) 07/16/2018       Assessment & Plan:  Type 1 DM, with polyneuropathy: no medication is needed now.   Chronic  pancreatitis.  This could also cause or contribute to DM.  Patient Instructions  check your blood sugar once a day.  vary the time of day when you check, between before the 3 meals, and at bedtime.  also check if you have symptoms of your blood sugar being too high or too low.  please keep a record of the readings and bring it to your next appointment here (or you can bring the meter itself).  You can write it on any piece of paper.  please call us sooner if your blood sugar goes below 70, or if you have a lot of readings over 200. With time, your need for insulin will return, so please call if your blood sugar goes over 200.  This is important, to prevent you from having to go back to the hospital.   Please come back for a follow-up appointment in 3 months.

## 2018-07-16 NOTE — Patient Instructions (Addendum)
check your blood sugar once a day.  vary the time of day when you check, between before the 3 meals, and at bedtime.  also check if you have symptoms of your blood sugar being too high or too low.  please keep a record of the readings and bring it to your next appointment here (or you can bring the meter itself).  You can write it on any piece of paper.  please call us sooner if your blood sugar goes below 70, or if you have a lot of readings over 200. With time, your need for insulin will return, so please call if your blood sugar goes over 200.  This is important, to prevent you from having to go back to the hospital.   Please come back for a follow-up appointment in 3 months.   

## 2018-10-16 ENCOUNTER — Encounter: Payer: Self-pay | Admitting: Endocrinology

## 2018-10-16 ENCOUNTER — Ambulatory Visit (INDEPENDENT_AMBULATORY_CARE_PROVIDER_SITE_OTHER): Payer: Medicaid Other | Admitting: Endocrinology

## 2018-10-16 VITALS — BP 126/72 | HR 75 | Ht 71.0 in

## 2018-10-16 DIAGNOSIS — E1042 Type 1 diabetes mellitus with diabetic polyneuropathy: Secondary | ICD-10-CM | POA: Diagnosis not present

## 2018-10-16 DIAGNOSIS — Z23 Encounter for immunization: Secondary | ICD-10-CM

## 2018-10-16 LAB — POCT GLYCOSYLATED HEMOGLOBIN (HGB A1C): HEMOGLOBIN A1C: 6.8 % — AB (ref 4.0–5.6)

## 2018-10-16 NOTE — Progress Notes (Signed)
Subjective:    Patient ID: Patrick [name], male    DOB: 02/20/1961, 57 y.o.   MRN: [id number]  HPI Pt returns for f/u of diabetes mellitus:   DM type: 1, vs chronic pancreatitis, now in remission.  Dx'ed: 2015.  Complications: painful polyneuropathy.   Therapy: no medication now.   DKA: only once, at time of dx.  Severe hypoglycemia: never.  Pancreatitis: never.  Pancreatic imaging (2007): pancreatic Ca++ Other: in 2017, he went into partial remission; he has been off insulin since early 2018; he stopped drinking EtOH in 2010.   Interval history: pt states he feels well in general.  pt states cbg's are in the low-100's.   Past Medical History:  Diagnosis Date  . Cirrhosis (HCC)   . Diabetes (HCC)    type 1  . ETOH abuse     Past Surgical History:  Procedure Laterality Date  . MOUTH SURGERY     on gums  . TONSILLECTOMY      Social History   Socioeconomic History  . Marital status: Divorced    Spouse name: Not on file  . Number of children: 1  . Years of education: Not on file  . Highest education level: Not on file  Occupational History  . Occupation: unemployed  Social Needs  . Financial resource strain: Not on file  . Food insecurity:    Worry: Not on file    Inability: Not on file  . Transportation needs:    Medical: Not on file    Non-medical: Not on file  Tobacco Use  . Smoking status: Current Every Day Smoker    Packs/day: 0.50    Years: 15.00    Pack years: 7.50    Types: Cigarettes  . Smokeless tobacco: Never Used  . Tobacco comment: tobacco info given 03/14/14  Substance and Sexual Activity  . Alcohol use: No  . Drug use: No  . Sexual activity: Not on file  Lifestyle  . Physical activity:    Days per week: Not on file    Minutes per session: Not on file  . Stress: Not on file  Relationships  . Social connections:    Talks on phone: Not on file    Gets together: Not on file    Attends religious service: Not on file    Active member of  club or organization: Not on file    Attends meetings of clubs or organizations: Not on file    Relationship status: Not on file  . Intimate partner violence:    Fear of current or ex partner: Not on file    Emotionally abused: Not on file    Physically abused: Not on file    Forced sexual activity: Not on file  Other Topics Concern  . Not on file  Social History Narrative  . Not on file    Current Outpatient Medications on File Prior to Visit  Medication Sig Dispense Refill  . ACCU-CHEK AVIVA PLUS test strip USE TO CHECK BLOOD SUGARS 4 TIMES PER DAY. 400 each 1  . Multiple Vitamin (MULTIVITAMIN WITH MINERALS) TABS tablet Take 1 tablet by mouth daily.    Letta Pate. ONETOUCH DELICA LANCETS 33G MISC USE TO CHECK BLOOD SUGARS 4 TIMES PER DAY. 200 each 2   No current facility-administered medications on file prior to visit.     No Known Allergies  Family History  Problem Relation Age of Onset  . Colon cancer Neg Hx   . Heart disease  Mother        bi-pass  . Hypertension Mother   . Cancer Brother        unsure type  . Hypertension Father     BP 126/72 (BP Location: Right Arm, Patient Position: Sitting, Cuff Size: Normal)   Pulse 75   Ht 5\' 11"  (1.803 m)   SpO2 95%   BMI 21.06 kg/m    Review of Systems Denies weight change.     Objective:   Physical Exam VITAL SIGNS:  See vs page GENERAL: no distress Pulses: foot pulses are intact bilaterally.   MSK: no deformity of the feet or ankles.  CV: no edema of the legs or ankles Skin:  no ulcer on the feet or ankles.  normal color and temp on the feet and ankles.  Neuro: sensation is intact to touch on the feet and ankles, but decreased from normal.   Ext: There is bilateral onychomycosis of the toenails.     A1c=6.8%    Assessment & Plan:  Type 2 DM, with polyneuropathy: worse Lean body habitus: this suggests he is evolving type 1 Patient Instructions  check your blood sugar once a day.  vary the time of day when you check,  between before the 3 meals, and at bedtime.  also check if you have symptoms of your blood sugar being too high or too low.  please keep a record of the readings and bring it to your next appointment here (or you can bring the meter itself).  You can write it on any piece of paper.  please call us sooner if your blood sugar goes below 70, or if you have a lot of readings over 200. With time, your need for insulin will return, so please call if your blood sugar goes over 200.  This is important, to prevent you from having to go back to the hospital.   Please come back for a follow-up appointment in 3 months.

## 2018-10-16 NOTE — Patient Instructions (Signed)
check your blood sugar once a day.  vary the time of day when you check, between before the 3 meals, and at bedtime.  also check if you have symptoms of your blood sugar being too high or too low.  please keep a record of the readings and bring it to your next appointment here (or you can bring the meter itself).  You can write it on any piece of paper.  please call us sooner if your blood sugar goes below 70, or if you have a lot of readings over 200. With time, your need for insulin will return, so please call if your blood sugar goes over 200.  This is important, to prevent you from having to go back to the hospital.   Please come back for a follow-up appointment in 3 months.

## 2018-11-04 ENCOUNTER — Emergency Department (HOSPITAL_COMMUNITY)
Admission: EM | Admit: 2018-11-04 | Discharge: 2018-11-04 | Disposition: A | Discharge status: Home / Self Care | Payer: Medicaid Other | Attending: Emergency Medicine | Admitting: Emergency Medicine

## 2018-11-04 ENCOUNTER — Emergency Department (HOSPITAL_COMMUNITY): Payer: Medicaid Other

## 2018-11-04 ENCOUNTER — Encounter (HOSPITAL_COMMUNITY): Payer: Self-pay

## 2018-11-04 DIAGNOSIS — F17210 Nicotine dependence, cigarettes, uncomplicated: Secondary | ICD-10-CM | POA: Diagnosis not present

## 2018-11-04 DIAGNOSIS — M5440 Lumbago with sciatica, unspecified side: Secondary | ICD-10-CM

## 2018-11-04 DIAGNOSIS — E109 Type 1 diabetes mellitus without complications: Secondary | ICD-10-CM | POA: Diagnosis not present

## 2018-11-04 DIAGNOSIS — Z79899 Other long term (current) drug therapy: Secondary | ICD-10-CM | POA: Diagnosis not present

## 2018-11-04 DIAGNOSIS — M545 Low back pain: Secondary | ICD-10-CM | POA: Insufficient documentation

## 2018-11-04 MED ORDER — METHOCARBAMOL 500 MG PO TABS: 500.0000 mg | ORAL_TABLET | Freq: Two times a day (BID) | ORAL | 0 refills | Status: AC

## 2018-11-04 MED ORDER — NAPROXEN 500 MG PO TABS: 500.0000 mg | ORAL_TABLET | Freq: Two times a day (BID) | ORAL | 0 refills | Status: AC

## 2018-11-04 NOTE — ED Notes (Signed)
Pt stable, ambulatory, states understanding of discharge instructions 

## 2018-11-04 NOTE — Discharge Instructions (Signed)
I have prescribed muscle relaxers for your pain, please do not drink or drive while taking this medications as they can make you drowsy.  Please follow-up with PCP in 1 week for reevaluation of your symptoms.  You experience any bowel or bladder incontinence, fever, worsening in your symptoms please return to the ED. ° °
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
## 2019-01-22 ENCOUNTER — Ambulatory Visit: Payer: Medicaid Other | Admitting: Endocrinology

## 2019-01-22 ENCOUNTER — Telehealth: Payer: Self-pay | Admitting: Endocrinology

## 2019-01-22 NOTE — Telephone Encounter (Signed)
Please schedule f/u appt for next available appointment  

## 2019-01-22 NOTE — Telephone Encounter (Signed)
Patient no showed today's appt. Please advise on how to follow up. °A. No follow up necessary. °B. Follow up urgent. Contact patient immediately. °C. Follow up necessary. Contact patient and schedule visit in ___ days. °D. Follow up advised. Contact patient and schedule visit in ____weeks. ° °Would you like the NS fee to be applied to this visit? ° °

## 2019-01-23 NOTE — Telephone Encounter (Signed)
LMTCB to reschedule missed appointment °

## 2019-02-08 ENCOUNTER — Other Ambulatory Visit: Payer: Self-pay

## 2019-02-08 ENCOUNTER — Ambulatory Visit: Payer: Medicaid Other | Admitting: Endocrinology

## 2019-02-08 ENCOUNTER — Encounter: Payer: Self-pay | Admitting: Endocrinology

## 2019-02-08 VITALS — Ht 71.0 in

## 2019-02-08 DIAGNOSIS — E1042 Type 1 diabetes mellitus with diabetic polyneuropathy: Secondary | ICD-10-CM | POA: Diagnosis not present

## 2019-02-08 LAB — POCT GLYCOSYLATED HEMOGLOBIN (HGB A1C): HEMOGLOBIN A1C: 7.7 % — AB (ref 4.0–5.6)

## 2019-02-08 MED ORDER — INSULIN ASPART 100 UNIT/ML FLEXPEN: 2.0000 [IU] | PEN_INJECTOR | Freq: Three times a day (TID) | SUBCUTANEOUS | 11 refills | Status: DC

## 2019-02-08 MED ORDER — INSULIN PEN NEEDLE 31G X 5 MM MISC: 1.0000 | Freq: Three times a day (TID) | 11 refills | Status: DC

## 2019-02-08 NOTE — Patient Instructions (Addendum)
Your blood pressure is high today.  Please see your primary care provider soon, to have it rechecked I have sent a prescription to your pharmacy, to resume Novolog, 2 units 3 times a day (just before each meal) check your blood sugar twice a day.  vary the time of day when you check, between before the 3 meals, and at bedtime.  also check if you have symptoms of your blood sugar being too high or too low.  please keep a record of the readings and bring it to your next appointment here (or you can bring the meter itself).  You can write it on any piece of paper.  please call us sooner if your blood sugar goes below 70, or if you have a lot of readings over 200. Please come back for a follow-up appointment in 2 months.

## 2019-02-08 NOTE — Progress Notes (Signed)
Subjective:    Patient ID: Patrick [name], male    DOB: 1961/10/07, 58 y.o.   MRN: [id number]  HPI Pt returns for f/u of diabetes mellitus:   DM type: 1, vs chronic pancreatitis, now in remission.  Dx'ed: 2015.  Complications: painful polyneuropathy.   Therapy: no medication now.   DKA: only once, at time of dx.  Severe hypoglycemia: never.  Pancreatitis: never.  Pancreatic imaging (2007): pancreatic Ca++ Other: in 2017, he went into partial remission; he has been off insulin since early 2018; he stopped drinking EtOH in 2010.   Interval history: pt states he feels well in general.  pt states cbg's are in the 100's.   Past Medical History:  Diagnosis Date  . Cirrhosis (HCC)   . Diabetes (HCC)    type 1  . ETOH abuse     Past Surgical History:  Procedure Laterality Date  . MOUTH SURGERY     on gums  . TONSILLECTOMY      Social History   Socioeconomic History  . Marital status: Divorced    Spouse name: Not on file  . Number of children: 1  . Years of education: Not on file  . Highest education level: Not on file  Occupational History  . Occupation: unemployed  Social Needs  . Financial resource strain: Not on file  . Food insecurity:    Worry: Not on file    Inability: Not on file  . Transportation needs:    Medical: Not on file    Non-medical: Not on file  Tobacco Use  . Smoking status: Current Every Day Smoker    Packs/day: 0.50    Years: 15.00    Pack years: 7.50    Types: Cigarettes  . Smokeless tobacco: Never Used  . Tobacco comment: tobacco info given 03/14/14  Substance and Sexual Activity  . Alcohol use: No  . Drug use: No  . Sexual activity: Not on file  Lifestyle  . Physical activity:    Days per week: Not on file    Minutes per session: Not on file  . Stress: Not on file  Relationships  . Social connections:    Talks on phone: Not on file    Gets together: Not on file    Attends religious service: Not on file    Active member of club or  organization: Not on file    Attends meetings of clubs or organizations: Not on file    Relationship status: Not on file  . Intimate partner violence:    Fear of current or ex partner: Not on file    Emotionally abused: Not on file    Physically abused: Not on file    Forced sexual activity: Not on file  Other Topics Concern  . Not on file  Social History Narrative  . Not on file    Current Outpatient Medications on File Prior to Visit  Medication Sig Dispense Refill  . Multiple Vitamin (MULTIVITAMIN WITH MINERALS) TABS tablet Take 1 tablet by mouth daily.     No current facility-administered medications on file prior to visit.     No Known Allergies  Family History  Problem Relation Age of Onset  . Heart disease Mother        bi-pass  . Hypertension Mother   . Cancer Brother        unsure type  . Hypertension Father   . Colon cancer Neg Hx     Ht 5\' 11"  (  1.803 m)   BMI 20.22 kg/m    Review of Systems No weight change    Objective:   Physical Exam GENERAL: no distress Pulses: dorsalis pedis intact bilat.   MSK: no deformity of the feet CV: no leg edema Skin:  no ulcer on the feet.  normal color and temp on the feet. Neuro: sensation is intact to touch on the feet Ext: There is bilateral onychomycosis of the toenails.   Lab Results  Component Value Date   HGBA1C 7.7 (A) 02/08/2019       Assessment & Plan:  Type 2 DM, with PN: worse Lean body habitus: in this setting, insulin is preferred rx HTN: is noted today.   Patient Instructions  Your blood pressure is high today.  Please see your primary care provider soon, to have it rechecked I have sent a prescription to your pharmacy, to resume Novolog, 2 units 3 times a day (just before each meal) check your blood sugar twice a day.  vary the time of day when you check, between before the 3 meals, and at bedtime.  also check if you have symptoms of your blood sugar being too high or too low.  please keep a  record of the readings and bring it to your next appointment here (or you can bring the meter itself).  You can write it on any piece of paper.  please call us sooner if your blood sugar goes below 70, or if you have a lot of readings over 200. Please come back for a follow-up appointment in 2 months.

## 2019-02-14 ENCOUNTER — Other Ambulatory Visit: Payer: Self-pay

## 2019-02-14 ENCOUNTER — Emergency Department (HOSPITAL_COMMUNITY)
Admission: EM | Admit: 2019-02-14 | Discharge: 2019-02-14 | Disposition: A | Discharge status: Home / Self Care | Payer: Medicaid Other | Attending: Emergency Medicine | Admitting: Emergency Medicine

## 2019-02-14 ENCOUNTER — Encounter (HOSPITAL_COMMUNITY): Payer: Self-pay | Admitting: Emergency Medicine

## 2019-02-14 DIAGNOSIS — F17210 Nicotine dependence, cigarettes, uncomplicated: Secondary | ICD-10-CM | POA: Diagnosis not present

## 2019-02-14 DIAGNOSIS — J029 Acute pharyngitis, unspecified: Secondary | ICD-10-CM

## 2019-02-14 DIAGNOSIS — Z79899 Other long term (current) drug therapy: Secondary | ICD-10-CM | POA: Insufficient documentation

## 2019-02-14 DIAGNOSIS — E109 Type 1 diabetes mellitus without complications: Secondary | ICD-10-CM | POA: Diagnosis not present

## 2019-02-14 DIAGNOSIS — Z794 Long term (current) use of insulin: Secondary | ICD-10-CM | POA: Insufficient documentation

## 2019-02-14 MED ORDER — FAMOTIDINE 20 MG PO TABS: 20.0000 mg | ORAL_TABLET | Freq: Two times a day (BID) | ORAL | 0 refills | Status: DC

## 2019-02-14 NOTE — ED Triage Notes (Signed)
See providers notes

## 2019-02-14 NOTE — ED Provider Notes (Signed)
[name] EMERGENCY DEPARTMENT Provider Note   CSN: [id number] Arrival date & time: 02/14/19  1053    History   Chief Complaint Chief Complaint  Patient presents with  . Sore Throat    HPI Patrick [name] is a 58 y.o. male.     HPI   58 year old male presents today with complaints of sore throat.  He notes that for the last week he has had minor sore throat.  He denies any cough, fever, nausea or vomiting, abdominal pain.  He did some burping.  Patient denies any history of allergies.  He denies chest pain.  No medications prior to arrival.  Pain is not worse with eating or drinking.  He denies any close sick contacts or travel.   Past Medical History:  Diagnosis Date  . Cirrhosis (HCC)   . Diabetes (HCC)    type 1  . ETOH abuse     Patient Active Problem List   Diagnosis Date Noted  . Type 1 diabetes mellitus (HCC) 03/22/2014  . Smoker 03/21/2014  . Cirrhosis of liver (HCC) 03/14/2014  . Colon cancer screening 03/14/2014  . Hypotension, unspecified 03/01/2014  . DKA (diabetic ketoacidoses) (HCC) 02/28/2014  . Hyponatremia 02/28/2014  . Hepatic encephalopathy (HCC) 08/24/2012  . Anemia 08/23/2012  . Malnutrition (HCC) 08/23/2012  . Acute renal failure (HCC) 08/23/2012  . Thrombocytopenia (HCC) 08/23/2012  . Coagulopathy (HCC) 08/23/2012  . Ascites 08/22/2012  . 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 MOUTH SURGERY     on gums  . TONSILLECTOMY          Home Medications    Prior to Admission medications   Medication Sig Start Date End Date Taking? Authorizing Provider  famotidine (PEPCID) 20 MG tablet Take 1 tablet (20 mg total) by mouth 2 (two) times daily. 02/14/19   Kenzley Ke, Tinnie Gens, PA-C  insulin aspart (NOVOLOG FLEXPEN) 100 UNIT/ML FlexPen Inject 2 Units into the skin 3 (three) times daily with meals. 02/08/19   Romero Belling, MD  Insulin Pen Needle (B-D UF III  MINI PEN NEEDLES) 31G X 5 MM MISC 1 Device by Does not apply route 3 (three) times daily. 02/08/19   Romero Belling, MD  Multiple Vitamin (MULTIVITAMIN WITH MINERALS) TABS tablet Take 1 tablet by mouth daily.    [provider]    Family History Family History  Problem Relation Age of Onset  . Heart disease Mother        bi-pass  . Hypertension Mother   . Cancer Brother        unsure type  . Hypertension Father   . Colon cancer Neg Hx     Social History Social History   Tobacco Use  . Smoking status: Current Every Day Smoker    Packs/day: 0.50    Years: 15.00    Pack years: 7.50    Types: Cigarettes  . Smokeless tobacco: Never Used  . Tobacco comment: tobacco info given 03/14/14  Substance Use Topics  . Alcohol use: No  . Drug use: No     Allergies   Patient has no known allergies.   Review of Systems Review of Systems  All other systems reviewed and are negative.   Physical Exam Updated Vital Signs BP (!) 151/94 (BP Location: Right Arm)   Pulse (!) 107   Temp 98.3 F (36.8 C) (Oral)   Resp 20   Ht 5\' 11"  (1.803  m)   Wt 66 kg   SpO2 100%   BMI 20.29 kg/m   Physical Exam Vitals signs and nursing note reviewed.  Constitutional:      Appearance: He is well-developed.  HENT:     Head: Normocephalic and atraumatic.     Mouth/Throat:     Comments: Oropharynx clear no erythema edema or exudate Eyes:     General: No scleral icterus.       Right eye: No discharge.        Left eye: No discharge.     Conjunctiva/sclera: Conjunctivae normal.     Pupils: Pupils are equal, round, and reactive to light.  Neck:     Musculoskeletal: Normal range of motion.     Vascular: No JVD.     Trachea: No tracheal deviation.  Pulmonary:     Effort: Pulmonary effort is normal.     Breath sounds: No stridor.  Abdominal:     Palpations: Abdomen is soft.  Neurological:     Mental Status: He is alert and oriented to person, place, and time.     Coordination:  Coordination normal.  Psychiatric:        Behavior: Behavior normal.        Thought Content: Thought content normal.        Judgment: Judgment normal.      ED Treatments / Results  Labs (all labs ordered are listed, but only abnormal results are displayed) Labs Reviewed - No data to display  EKG None  Radiology No results found.  Procedures Procedures (including critical care time)  Medications Ordered in ED Medications - No data to display   Initial Impression / Assessment and Plan / ED Course  I have reviewed the triage vital signs and the nursing notes.  Pertinent labs & imaging results that were available during my care of the patient were reviewed by me and considered in my medical decision making (see chart for details).        Patient here with sore throat - no signs of infection, quest GERD- Np chest pain. Pt to follow up as an outpatient. Return if symptoms worsen.   Final Clinical Impressions(s) / ED Diagnoses   Final diagnoses:  Sore throat    ED Discharge Orders         Ordered    famotidine (PEPCID) 20 MG tablet  2 times daily     02/14/19 1104           Eyvonne Mechanic, PA-C 02/14/19 1451    Cathren Laine, MD 02/14/19 [phone]

## 2019-02-14 NOTE — ED Notes (Signed)
Patient verbalizes understanding of discharge instructions. Opportunity for questioning and answers were provided. Armband removed by staff, pt discharged from ED home via POV.  

## 2019-02-14 NOTE — Discharge Instructions (Signed)
Please read attached information. If you experience any new or worsening signs or symptoms please return to the emergency room for evaluation. Please follow-up with your primary care provider or specialist as discussed. Please use medication prescribed only as directed and discontinue taking if you have any concerning signs or symptoms.   °

## 2019-02-27 ENCOUNTER — Encounter (HOSPITAL_COMMUNITY): Payer: Self-pay | Admitting: Emergency Medicine

## 2019-02-27 ENCOUNTER — Emergency Department (HOSPITAL_COMMUNITY)
Admission: EM | Admit: 2019-02-27 | Discharge: 2019-02-27 | Disposition: A | Discharge status: Home / Self Care | Payer: Medicaid Other | Attending: Emergency Medicine | Admitting: Emergency Medicine

## 2019-02-27 DIAGNOSIS — E109 Type 1 diabetes mellitus without complications: Secondary | ICD-10-CM | POA: Insufficient documentation

## 2019-02-27 DIAGNOSIS — J029 Acute pharyngitis, unspecified: Secondary | ICD-10-CM | POA: Diagnosis present

## 2019-02-27 DIAGNOSIS — K219 Gastro-esophageal reflux disease without esophagitis: Secondary | ICD-10-CM | POA: Diagnosis not present

## 2019-02-27 DIAGNOSIS — Z794 Long term (current) use of insulin: Secondary | ICD-10-CM | POA: Insufficient documentation

## 2019-02-27 DIAGNOSIS — Z79899 Other long term (current) drug therapy: Secondary | ICD-10-CM | POA: Diagnosis not present

## 2019-02-27 DIAGNOSIS — F17210 Nicotine dependence, cigarettes, uncomplicated: Secondary | ICD-10-CM | POA: Insufficient documentation

## 2019-02-27 MED ORDER — OMEPRAZOLE 20 MG PO CPDR: 20.0000 mg | DELAYED_RELEASE_CAPSULE | Freq: Every day | ORAL | 0 refills | Status: DC

## 2019-02-27 NOTE — Discharge Instructions (Addendum)
Please read attached information. If you experience any new or worsening signs or symptoms please return to the emergency room for evaluation. Please follow-up with your primary care provider or specialist as discussed. Please use medication prescribed only as directed and discontinue taking if you have any concerning signs or symptoms.   °

## 2019-02-27 NOTE — ED Provider Notes (Signed)
[facility] EMERGENCY DEPARTMENT Provider Note   CSN: [id number] Arrival date & time: 02/27/19  1022   History   Chief Complaint Chief Complaint  Patient presents with  . Gastroesophageal Reflux  . Follow-up    HPI Patrick [name] is a 58 y.o. male.     HPI   58 year old male presents today with complaints of sore throat and burning.  Patient notes that several weeks ago he had some burping burning up into his throat.  Patient denied any fever, cough, abdominal pain nausea or vomiting.  He was seen in the emergency room and discharged home with Pepcid.  He notes the Pepcid did start to improve his symptoms but he ran out of the medication yesterday.  Patient notes that he did the read the list of recommended foods to eat but has been eating sausage and egg, and pot pies.  Patient notes he has not been smoking over the last several weeks.  He denies any chest pain, fever, nausea or vomiting, cough or shortness of breath presently.      Past Medical History:  Diagnosis Date  . Cirrhosis (HCC)   . Diabetes (HCC)    type 1  . ETOH abuse     Patient Active Problem List   Diagnosis Date Noted  . Type 1 diabetes mellitus (HCC) 03/22/2014  . Smoker 03/21/2014  . Cirrhosis of liver (HCC) 03/14/2014  . Colon cancer screening 03/14/2014  . Hypotension, unspecified 03/01/2014  . DKA (diabetic ketoacidoses) (HCC) 02/28/2014  . Hyponatremia 02/28/2014  . Hepatic encephalopathy (HCC) 08/24/2012  . Anemia 08/23/2012  . Malnutrition (HCC) 08/23/2012  . Acute renal failure (HCC) 08/23/2012  . Thrombocytopenia (HCC) 08/23/2012  . Coagulopathy (HCC) 08/23/2012  . Ascites 08/22/2012  . 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 MOUTH SURGERY     on gums  . TONSILLECTOMY          Home Medications    Prior to Admission medications   Medication Sig Start Date End Date Taking? Authorizing  Provider  famotidine (PEPCID) 20 MG tablet Take 1 tablet (20 mg total) by mouth 2 (two) times daily. 02/14/19   Waymond Meador, Tinnie Gens, PA-C  insulin aspart (NOVOLOG FLEXPEN) 100 UNIT/ML FlexPen Inject 2 Units into the skin 3 (three) times daily with meals. 02/08/19   Romero Belling, MD  Insulin Pen Needle (B-D UF III MINI PEN NEEDLES) 31G X 5 MM MISC 1 Device by Does not apply route 3 (three) times daily. 02/08/19   Romero Belling, MD  Multiple Vitamin (MULTIVITAMIN WITH MINERALS) TABS tablet Take 1 tablet by mouth daily.    [provider]  omeprazole (PRILOSEC) 20 MG capsule Take 1 capsule (20 mg total) by mouth daily. 02/27/19   Eyvonne Mechanic, PA-C    Family History Family History  Problem Relation Age of Onset  . Heart disease Mother        bi-pass  . Hypertension Mother   . Cancer Brother        unsure type  . Hypertension Father   . Colon cancer Neg Hx     Social History Social History   Tobacco Use  . Smoking status: Current Every Day Smoker    Packs/day: 0.50    Years: 15.00    Pack years: 7.50    Types: Cigarettes  . Smokeless tobacco: Never Used  . Tobacco comment: tobacco info given 03/14/14  Substance Use Topics  .  Alcohol use: No  . Drug use: No     Allergies   Patient has no known allergies.   Review of Systems Review of Systems  All other systems reviewed and are negative.    Physical Exam Updated Vital Signs BP (!) 151/92 (BP Location: Right Arm)   Pulse (!) 117   Temp 98.3 F (36.8 C) (Oral)   Resp 16   SpO2 100%   Physical Exam Vitals signs and nursing note reviewed.  Constitutional:      Appearance: He is well-developed.  HENT:     Head: Normocephalic and atraumatic.     Mouth/Throat:     Comments: Oropharynx clear no erythema exudate or edema voice is normal neck is supple full active range of motion and nontender  Eyes:     General: No scleral icterus.       Right eye: No discharge.        Left eye: No discharge.      Conjunctiva/sclera: Conjunctivae normal.     Pupils: Pupils are equal, round, and reactive to light.  Neck:     Musculoskeletal: Normal range of motion.     Vascular: No JVD.     Trachea: No tracheal deviation.  Pulmonary:     Effort: Pulmonary effort is normal. No respiratory distress.     Breath sounds: No stridor. No wheezing, rhonchi or rales.  Abdominal:     Comments: Soft nontender  Neurological:     Mental Status: He is alert and oriented to person, place, and time.     Coordination: Coordination normal.  Psychiatric:        Behavior: Behavior normal.        Thought Content: Thought content normal.        Judgment: Judgment normal.      ED Treatments / Results  Labs (all labs ordered are listed, but only abnormal results are displayed) Labs Reviewed - No data to display  EKG None  Radiology No results found.  Procedures Procedures (including critical care time)  Medications Ordered in ED Medications - No data to display   Initial Impression / Assessment and Plan / ED Course  I have reviewed the triage vital signs and the nursing notes.  Pertinent labs & imaging results that were available during my care of the patient were reviewed by me and considered in my medical decision making (see chart for details).         Assessment/Plan: 58 year old male presents today with likely indigestion.  Patient is well-appearing in no acute distress.  He has no signs of respiratory or cardiac involvement.  Patient did have improvement with antihistamines, he will be started on Prilosec.  I have discussed that he needs to follow-up with his primary care for reevaluation for this if he develops any new or worsening signs or symptoms he will return to the emergency room.  He is encouraged to continue not smoking, avoid provoking foods and avoid alcohol.  Patient verbalized understanding and agreement to today's plan had no further questions or concerns    Final Clinical  Impressions(s) / ED Diagnoses   Final diagnoses:  Gastroesophageal reflux disease, esophagitis presence not specified    ED Discharge Orders         Ordered    omeprazole (PRILOSEC) 20 MG capsule  Daily     02/27/19 1041           Eyvonne MechanicHedges, Saifan Rayford, PA-C 02/27/19 1042    Sabas SousBero, Michael M, MD 02/28/19  0710  

## 2019-02-27 NOTE — ED Triage Notes (Signed)
Pt. Stated, Im having really bad indigestion and I have all the stuff that Im not suppose to eat and Im still having the burning.

## 2019-03-04 ENCOUNTER — Telehealth: Payer: Medicaid Other | Admitting: Family

## 2019-03-04 DIAGNOSIS — R079 Chest pain, unspecified: Secondary | ICD-10-CM

## 2019-03-04 DIAGNOSIS — Z09 Encounter for follow-up examination after completed treatment for conditions other than malignant neoplasm: Secondary | ICD-10-CM

## 2019-03-04 DIAGNOSIS — K219 Gastro-esophageal reflux disease without esophagitis: Secondary | ICD-10-CM

## 2019-03-04 NOTE — Progress Notes (Signed)
Based on what you shared with me, I feel your condition warrants further evaluation and I recommend that you be seen for a face to face office visit.  Since you have been to the ED and your symptoms have not improved, you need to be seen face to face. I recommend trying to fine a primary care provider.    NOTE: If you entered your credit card information for this eVisit, you will not be charged. You may see a "hold" on your card for the $35 but that hold will drop off and you will not have a charge processed.  If you are having a true medical emergency please call 911.  If you need an urgent face to face visit, [facility] has four urgent care centers for your convenience.    PLEASE NOTE: THE [facility] LOCATIONS AND URGENT CARE CLINICS DO NOT HAVE THE TESTING FOR CORONAVIRUS COVID19 AVAILABLE.  IF YOU FEEL YOU NEED THIS TEST YOU MUST GO TO A TRIAGE LOCATION AT ONE OF THE HOSPITAL EMERGENCY DEPARTMENTS   [email or web address] to reserve your spot online an avoid wait times  [facility] [address] [place], [place] [address] Modified hours of operation: Monday-Friday, 10 AM to 6 PM  Saturday & Sunday 10 AM to 4 PM *Across the street from [employer]  [employer] (New Address!) [address], [address] [place], [place] [address] *Just off [employer], across the road from [place]* Modified hours of operation: Monday-Friday, 10 AM to 5 PM  Closed Saturday & Sunday   The following sites will take your insurance:  . [facility]  [phone] Get Driving Directions Find a Provider at this Location  [address] [place], [place] [address] . 10 am to 8 pm Monday-Friday . 12 pm to 8 pm Saturday-Sunday   . [facility] at [facility]  [phone] Get Driving Directions Find a Provider at this Location  [address], [address] [place], [place] [address] . 8 am to 8 pm  Monday-Friday . 9 am to 6 pm Saturday . 11 am to 6 pm Sunday   . [facility] at [facility]  [phone] Get Driving Directions  [address] [place], [place] [address] . 8 am to 8 pm Monday-Friday . 8 am to 4 pm Saturday-Sunday   Your e-visit answers were reviewed by a board certified advanced clinical practitioner to complete your personal care plan.  Thank you for using e-Visits.

## 2019-03-09 ENCOUNTER — Other Ambulatory Visit: Payer: Self-pay | Admitting: Endocrinology

## 2019-04-09 ENCOUNTER — Encounter: Payer: Self-pay | Admitting: Endocrinology

## 2019-04-10 ENCOUNTER — Ambulatory Visit (INDEPENDENT_AMBULATORY_CARE_PROVIDER_SITE_OTHER): Payer: Medicaid Other | Admitting: Endocrinology

## 2019-04-10 ENCOUNTER — Other Ambulatory Visit: Payer: Self-pay

## 2019-04-10 DIAGNOSIS — E1042 Type 1 diabetes mellitus with diabetic polyneuropathy: Secondary | ICD-10-CM

## 2019-04-10 MED ORDER — GLUCOSE BLOOD VI STRP: 1.0000 | ORAL_STRIP | Freq: Four times a day (QID) | 3 refills | Status: DC

## 2019-04-10 MED ORDER — INSULIN ASPART 100 UNIT/ML FLEXPEN: 3.0000 [IU] | PEN_INJECTOR | Freq: Three times a day (TID) | SUBCUTANEOUS | 11 refills | Status: DC

## 2019-04-10 NOTE — Progress Notes (Signed)
Subjective:    Patient ID: Patrick [name], male    DOB: [birth year], 58 y.o.   MRN: [id number]  HPI  telehealth visit today via doxy video visit.  Alternatives to telehealth are presented to this patient, and the patient agrees to the telehealth visit. Pt is advised of the cost of the visit, and agrees to this, also.   Patient is at home, and I am at the office.   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  Dx'ed: 2015.  Complications: painful polyneuropathy.   Therapy: insulin since 2020 DKA: only once, at time of dx.  Severe hypoglycemia: never.  Pancreatitis: never.  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   Interval history: pt states he feels well in general.  pt states cbg's vary from 97-190.  There is no trend throughout the day.  He has quit smoking.   Past Medical History:  Diagnosis Date  . Cirrhosis (HCC)   . Diabetes (HCC)    type 1  . ETOH abuse     Past Surgical History:  Procedure Laterality Date  . MOUTH SURGERY     on gums  . TONSILLECTOMY      Social History   Socioeconomic History  . Marital status: Divorced    Spouse name: Not on file  . Number of children: 1  . Years of education: Not on file  . Highest education level: Not on file  Occupational History  . Occupation: unemployed  Social Needs  . Financial resource strain: Not on file  . Food insecurity:    Worry: Not on file    Inability: Not on file  . Transportation needs:    Medical: Not on file    Non-medical: Not on file  Tobacco Use  . Smoking status: Current Every Day Smoker    Packs/day: 0.50    Years: 15.00    Pack years: 7.50    Types: Cigarettes  . Smokeless tobacco: Never Used  . Tobacco comment: tobacco info given 03/14/14  Substance and Sexual  Activity  . Alcohol use: No  . Drug use: No  . Sexual activity: Not on file  Lifestyle  . Physical activity:    Days per week: Not on file    Minutes per session: Not on file  . Stress: Not on file  Relationships  . Social connections:    Talks on phone: Not on file    Gets together: Not on file    Attends religious service: Not on file    Active member of club or organization: Not on file    Attends meetings of clubs or organizations: Not on file    Relationship status: Not on file  . Intimate partner violence:    Fear of current or ex partner: Not on file    Emotionally abused: Not on file    Physically abused: Not on file    Forced sexual activity: Not on file  Other Topics Concern  . Not on file  Social History Narrative  . Not on file    Current Outpatient Medications on File Prior to Visit  Medication Sig Dispense Refill  . famotidine (PEPCID) 20 MG tablet Take 1 tablet (20 mg total) by mouth 2 (two) times daily. 30 tablet 0  .  Insulin Pen Needle (B-D UF III MINI PEN NEEDLES) 31G X 5 MM MISC 1 Device by Does not apply route 3 (three) times daily. 100 each 11  . Multiple Vitamin (MULTIVITAMIN WITH MINERALS) TABS tablet Take 1 tablet by mouth daily.    Marland Kitchen. omeprazole (PRILOSEC) 20 MG capsule Take 1 capsule (20 mg total) by mouth daily. 30 capsule 0   No current facility-administered medications on file prior to visit.     No Known Allergies  Family History  Problem Relation Age of Onset  . Heart disease Mother        bi-pass  . Hypertension Mother   . Cancer Brother        unsure type  . Hypertension Father   . Colon cancer Neg Hx     There were no vitals taken for this visit.   Review of Systems He denies hypoglycemia.      Objective:   Physical Exam     Assessment & Plan:  Type 1 DM: worse.  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   check your blood sugar 4 times a day.  vary the time of day when you check, between before the 3 meals, and at bedtime.  also check if you have symptoms of your blood sugar being too high or too low.  please keep a record of the readings and bring it to your next appointment here (or you can bring the meter itself).  You can write it on any piece of paper.  please call us sooner if your blood sugar goes below 70, or if you have a lot of readings over 200. Please come back for a follow-up appointment in 2 months.

## 2019-04-10 NOTE — Patient Instructions (Addendum)
please increase Novolog to 3 units 3 times a day (just before each meal).   check your blood sugar 4 times a day.  vary the time of day when you check, between before the 3 meals, and at bedtime.  also check if you have symptoms of your blood sugar being too high or too low.  please keep a record of the readings and bring it to your next appointment here (or you can bring the meter itself).  You can write it on any piece of paper.  please call us sooner if your blood sugar goes below 70, or if you have a lot of readings over 200. Please come back for a follow-up appointment in 2 months.

## 2019-06-10 ENCOUNTER — Other Ambulatory Visit: Payer: Self-pay

## 2019-06-12 ENCOUNTER — Encounter: Payer: Self-pay | Admitting: Endocrinology

## 2019-06-12 ENCOUNTER — Ambulatory Visit (INDEPENDENT_AMBULATORY_CARE_PROVIDER_SITE_OTHER): Payer: Medicaid Other | Admitting: Endocrinology

## 2019-06-12 ENCOUNTER — Other Ambulatory Visit: Payer: Self-pay

## 2019-06-12 VITALS — BP 152/84 | HR 86 | Ht 71.0 in | Wt 162.4 lb

## 2019-06-12 DIAGNOSIS — E1042 Type 1 diabetes mellitus with diabetic polyneuropathy: Secondary | ICD-10-CM | POA: Diagnosis not present

## 2019-06-12 LAB — POCT GLYCOSYLATED HEMOGLOBIN (HGB A1C): Hemoglobin A1C: 7.9 % — AB (ref 4.0–5.6)

## 2019-06-12 MED ORDER — NOVOLOG FLEXPEN 100 UNIT/ML ~~LOC~~ SOPN: 4.0000 [IU] | PEN_INJECTOR | Freq: Three times a day (TID) | SUBCUTANEOUS | 11 refills | Status: DC

## 2019-06-12 NOTE — Progress Notes (Signed)
Subjective:    Patient ID: Patrick [name], male    DOB: 08/24/1961, 58 y.o.   MRN: [id number]  HPI Pt returns for f/u of diabetes mellitus:   DM type: 1, vs chronic pancreatitis Dx'ed: 2015.  Complications: painful PN  Therapy: insulin since 2020 DKA: only once, at time of dx.  Severe hypoglycemia: never.  Pancreatitis: never.  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   Interval history: no cbg record, but states cbg's vary from 129-200's.  It is in general higher as the day goes on.  No new sxs.   Past Medical History:  Diagnosis Date  . Cirrhosis (HCC)   . Diabetes (HCC)    type 1  . ETOH abuse     Past Surgical History:  Procedure Laterality Date  . MOUTH SURGERY     on gums  . TONSILLECTOMY      Social History   Socioeconomic History  . Marital status: Divorced    Spouse name: Not on file  . Number of children: 1  . Years of education: Not on file  . Highest education level: Not on file  Occupational History  . Occupation: unemployed  Social Needs  . Financial resource strain: Not on file  . Food insecurity    Worry: Not on file    Inability: Not on file  . Transportation needs    Medical: Not on file    Non-medical: Not on file  Tobacco Use  . Smoking status: Current Every Day Smoker    Packs/day: 0.50    Years: 15.00    Pack years: 7.50    Types: Cigarettes  . Smokeless tobacco: Never Used  . Tobacco comment: tobacco info given 03/14/14  Substance and Sexual Activity  . Alcohol use: No  . Drug use: No  . Sexual activity: Not on file  Lifestyle  . Physical activity    Days per week: Not on file    Minutes per session: Not on file  . Stress: Not on file  Relationships  . Social Musicianconnections    Talks on phone: Not on file    Gets together: Not on file    Attends religious service: Not on file    Active member of club or  organization: Not on file    Attends meetings of clubs or organizations: Not on file    Relationship status: Not on file  . Intimate partner violence    Fear of current or ex partner: Not on file    Emotionally abused: Not on file    Physically abused: Not on file    Forced sexual activity: Not on file  Other Topics Concern  . Not on file  Social History Narrative  . Not on file    Current Outpatient Medications on File Prior to Visit  Medication Sig Dispense Refill  . famotidine (PEPCID) 20 MG tablet Take 1 tablet (20 mg total) by mouth 2 (two) times daily. 30 tablet 0  . glucose blood (ACCU-CHEK AVIVA PLUS) test strip 1 each by Other route 4 (four) times daily. And lancets 4/day 360 each 3  . Insulin Pen Needle (B-D UF III MINI PEN NEEDLES) 31G X 5 MM MISC 1 Device by Does not apply route 3 (three) times daily. 100 each 11  . Multiple Vitamin (MULTIVITAMIN WITH MINERALS) TABS tablet Take 1 tablet by mouth daily.    .Marland Kitchen  omeprazole (PRILOSEC) 20 MG capsule Take 1 capsule (20 mg total) by mouth daily. 30 capsule 0   No current facility-administered medications on file prior to visit.     No Known Allergies  Family History  Problem Relation Age of Onset  . Heart disease Mother        bi-pass  . Hypertension Mother   . Cancer Brother        unsure type  . Hypertension Father   . Colon cancer Neg Hx     BP (!) 152/84 (BP Location: Left Arm, Patient Position: Sitting, Cuff Size: Normal)   Pulse 86   Ht 5\' 11"  (1.803 m)   Wt 162 lb 6.4 oz (73.7 kg)   SpO2 96%   BMI 22.65 kg/m   Review of Systems He denies hypoglycemia.      Objective:   Physical Exam VITAL SIGNS:  See vs page GENERAL: no distress Pulses: dorsalis pedis intact bilat.   MSK: no deformity of the feet CV: no leg edema Skin:  no ulcer on the feet.  normal color and temp on the feet. Neuro: sensation is intact to touch on the feet, but decreased from normal.   Ext: there is bilateral onychomycosis of the  toenails.     Lab Results  Component Value Date   HGBA1C 7.9 (A) 06/12/2019       Assessment & Plan:  HTN: is noted today Type 1 DM: worse.  Patient Instructions  Your blood pressure is high today.  Please see your primary care provider soon, to have it rechecked.   please increase Novolog to 4 units 3 times a day (just before each meal).   check your blood sugar 4 times a day.  vary the time of day when you check, between before the 3 meals, and at bedtime.  also check if you have symptoms of your blood sugar being too high or too low.  please keep a record of the readings and bring it to your next appointment here (or you can bring the meter itself).  You can write it on any piece of paper.  please call us sooner if your blood sugar goes below 70, or if you have a lot of readings over 200. Please come back for a follow-up appointment in 2 months.

## 2019-06-12 NOTE — Patient Instructions (Addendum)
Your blood pressure is high today.  Please see your primary care provider soon, to have it rechecked.   please increase Novolog to 4 units 3 times a day (just before each meal).   check your blood sugar 4 times a day.  vary the time of day when you check, between before the 3 meals, and at bedtime.  also check if you have symptoms of your blood sugar being too high or too low.  please keep a record of the readings and bring it to your next appointment here (or you can bring the meter itself).  You can write it on any piece of paper.  please call us sooner if your blood sugar goes below 70, or if you have a lot of readings over 200. Please come back for a follow-up appointment in 2 months.

## 2019-09-09 ENCOUNTER — Other Ambulatory Visit: Payer: Self-pay

## 2019-09-11 ENCOUNTER — Ambulatory Visit: Payer: Medicaid Other | Admitting: Endocrinology

## 2019-09-11 ENCOUNTER — Encounter: Payer: Self-pay | Admitting: Endocrinology

## 2019-09-11 ENCOUNTER — Other Ambulatory Visit: Payer: Self-pay

## 2019-09-11 VITALS — BP 154/82 | HR 88 | Ht 71.0 in | Wt 171.2 lb

## 2019-09-11 DIAGNOSIS — E1042 Type 1 diabetes mellitus with diabetic polyneuropathy: Secondary | ICD-10-CM | POA: Diagnosis not present

## 2019-09-11 DIAGNOSIS — Z23 Encounter for immunization: Secondary | ICD-10-CM | POA: Diagnosis not present

## 2019-09-11 LAB — POCT GLYCOSYLATED HEMOGLOBIN (HGB A1C): Hemoglobin A1C: 7.6 % — AB (ref 4.0–5.6)

## 2019-09-11 MED ORDER — NOVOLOG FLEXPEN 100 UNIT/ML ~~LOC~~ SOPN: 5.0000 [IU] | PEN_INJECTOR | Freq: Three times a day (TID) | SUBCUTANEOUS | 11 refills | Status: DC

## 2019-09-11 NOTE — Progress Notes (Signed)
Subjective:    Patient ID: Patrick [name], male    DOB: 1961/02/03, 58 y.o.   MRN: [id number]  HPI Pt returns for f/u of diabetes mellitus:   DM type: 1, vs chronic pancreatitis Dx'ed: 2015.  Complications: painful PN  Therapy: insulin since 2020 DKA: only once, at time of dx.  Severe hypoglycemia: never.  Pancreatitis: never.  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   Interval history: no cbg record, but states cbg's vary from 88-200.  It is in general higher as the day goes on.   Past Medical History:  Diagnosis Date  . Cirrhosis (HCC)   . Diabetes (HCC)    type 1  . ETOH abuse     Past Surgical History:  Procedure Laterality Date  . MOUTH SURGERY     on gums  . TONSILLECTOMY      Social History   Socioeconomic History  . Marital status: Divorced    Spouse name: Not on file  . Number of children: 1  . Years of education: Not on file  . Highest education level: Not on file  Occupational History  . Occupation: unemployed  Social Needs  . Financial resource strain: Not on file  . Food insecurity    Worry: Not on file    Inability: Not on file  . Transportation needs    Medical: Not on file    Non-medical: Not on file  Tobacco Use  . Smoking status: Current Every Day Smoker    Packs/day: 0.50    Years: 15.00    Pack years: 7.50    Types: Cigarettes  . Smokeless tobacco: Never Used  . Tobacco comment: tobacco info given 03/14/14  Substance and Sexual Activity  . Alcohol use: No  . Drug use: No  . Sexual activity: Not on file  Lifestyle  . Physical activity    Days per week: Not on file    Minutes per session: Not on file  . Stress: Not on file  Relationships  . Social Musician on phone: Not on file    Gets together: Not on file    Attends religious service: Not on file    Active member of club or organization: Not on file     Attends meetings of clubs or organizations: Not on file    Relationship status: Not on file  . Intimate partner violence    Fear of current or ex partner: Not on file    Emotionally abused: Not on file    Physically abused: Not on file    Forced sexual activity: Not on file  Other Topics Concern  . Not on file  Social History Narrative  . Not on file    Current Outpatient Medications on File Prior to Visit  Medication Sig Dispense Refill  . famotidine (PEPCID) 20 MG tablet Take 1 tablet (20 mg total) by mouth 2 (two) times daily. 30 tablet 0  . glucose blood (ACCU-CHEK AVIVA PLUS) test strip 1 each by Other route 4 (four) times daily. And lancets 4/day 360 each 3  . Insulin Pen Needle (B-D UF III MINI PEN NEEDLES) 31G X 5 MM MISC 1 Device by Does not apply route 3 (three) times daily. 100 each 11  . Multiple Vitamin (MULTIVITAMIN WITH MINERALS) TABS tablet Take 1 tablet by mouth daily.    Marland Kitchen omeprazole (PRILOSEC) 20  MG capsule Take 1 capsule (20 mg total) by mouth daily. 30 capsule 0   No current facility-administered medications on file prior to visit.     No Known Allergies  Family History  Problem Relation Age of Onset  . Heart disease Mother        bi-pass  . Hypertension Mother   . Cancer Brother        unsure type  . Hypertension Father   . Colon cancer Neg Hx     BP (!) 154/82 (BP Location: Left Arm, Patient Position: Sitting, Cuff Size: Normal)   Pulse 88   Ht 5\' 11"  (1.803 m)   Wt 171 lb 3.2 oz (77.7 kg)   SpO2 95%   BMI 23.88 kg/m   Review of Systems He denies hypoglycemia.      Objective:   Physical Exam VITAL SIGNS:  See vs page GENERAL: no distress Pulses: dorsalis pedis intact bilat.   MSK: no deformity of the feet CV: no leg edema Skin:  no ulcer on the feet.  normal color and temp on the feet. Neuro: sensation is intact to touch on the feet Ext: there is bilateral onychomycosis of the toenails.     Lab Results  Component Value Date    HGBA1C 7.6 (A) 09/11/2019       Assessment & Plan:  Type 1 DM: he needs increased rx.   HTN: is noted today Lean body habitus: in this setting, he'll need to add basal insulin at some point.  Patient Instructions  Your blood pressure is high today.  Please see your primary care provider soon, to have it rechecked.   please increase Novolog to 5 units 3 times a day (just before each meal).   check your blood sugar 4 times a day.  vary the time of day when you check, between before the 3 meals, and at bedtime.  also check if you have symptoms of your blood sugar being too high or too low.  please keep a record of the readings and bring it to your next appointment here (or you can bring the meter itself).  You can write it on any piece of paper.  please call us sooner if your blood sugar goes below 70, or if you have a lot of readings over 200. Please come back for a follow-up appointment in 3 months.

## 2019-09-11 NOTE — Patient Instructions (Addendum)
Your blood pressure is high today.  Please see your primary care provider soon, to have it rechecked.   please increase Novolog to 5 units 3 times a day (just before each meal).   check your blood sugar 4 times a day.  vary the time of day when you check, between before the 3 meals, and at bedtime.  also check if you have symptoms of your blood sugar being too high or too low.  please keep a record of the readings and bring it to your next appointment here (or you can bring the meter itself).  You can write it on any piece of paper.  please call us sooner if your blood sugar goes below 70, or if you have a lot of readings over 200. Please come back for a follow-up appointment in 3 months.

## 2019-12-10 ENCOUNTER — Other Ambulatory Visit: Payer: Self-pay

## 2019-12-12 ENCOUNTER — Encounter: Payer: Self-pay | Admitting: Endocrinology

## 2019-12-12 ENCOUNTER — Ambulatory Visit: Payer: Medicaid Other | Admitting: Endocrinology

## 2019-12-12 ENCOUNTER — Other Ambulatory Visit: Payer: Self-pay

## 2019-12-12 VITALS — BP 128/80 | HR 79 | Ht 71.0 in | Wt 176.4 lb

## 2019-12-12 DIAGNOSIS — E1042 Type 1 diabetes mellitus with diabetic polyneuropathy: Secondary | ICD-10-CM

## 2019-12-12 LAB — POCT GLYCOSYLATED HEMOGLOBIN (HGB A1C): Hemoglobin A1C: 8.1 % — AB (ref 4.0–5.6)

## 2019-12-12 MED ORDER — NOVOLOG FLEXPEN 100 UNIT/ML ~~LOC~~ SOPN: 6.0000 [IU] | PEN_INJECTOR | Freq: Three times a day (TID) | SUBCUTANEOUS | 11 refills | Status: DC

## 2019-12-12 NOTE — Patient Instructions (Addendum)
please increase Novolog to 6 units 3 times a day (just before each meal).   check your blood sugar 4 times a day.  vary the time of day when you check, between before the 3 meals, and at bedtime.  also check if you have symptoms of your blood sugar being too high or too low.  please keep a record of the readings and bring it to your next appointment here (or you can bring the meter itself).  You can write it on any piece of paper.  please call us sooner if your blood sugar goes below 70, or if you have a lot of readings over 200. Please come back for a follow-up appointment in 2 months.

## 2019-12-12 NOTE — Progress Notes (Signed)
Subjective:    Patient ID: Patrick [name], male    DOB: May 06, 1961, 59 y.o.   MRN: [id number]  HPI Pt returns for f/u of diabetes mellitus:   DM type: 1, vs chronic pancreatitis Dx'ed: 2015.  Complications: painful PN  Therapy: insulin since 2020 DKA: only once, at time of dx.  Severe hypoglycemia: never.  Pancreatitis: never.  Pancreatic imaging (2007): pancreatic Ca++ Other: in 2017, he went into partial remission; he was off insulin 2018-2020; he stopped drinking EtOH in 2010; he takes multiple daily injections.     Interval history: no cbg record, but states cbg's vary from 80-200.  It is in general higher as the day goes on.  pt states he feels well in general. Past Medical History:  Diagnosis Date  . Cirrhosis ([place])   . Diabetes ([place])    type 1  . ETOH abuse     Past Surgical History:  Procedure Laterality Date  . MOUTH SURGERY     on gums  . TONSILLECTOMY      Social History   Socioeconomic History  . Marital status: Divorced    Spouse name: Not on file  . Number of children: 1  . Years of education: Not on file  . Highest education level: Not on file  Occupational History  . Occupation: unemployed  Tobacco Use  . Smoking status: Current Every Day Smoker    Packs/day: 0.50    Years: 15.00    Pack years: 7.50    Types: Cigarettes  . Smokeless tobacco: Never Used  . Tobacco comment: tobacco info given 03/14/14  Substance and Sexual Activity  . Alcohol use: No  . Drug use: No  . Sexual activity: Not on file  Other Topics Concern  . Not on file  Social History Narrative  . Not on file   Social Determinants of Health   Financial Resource Strain:   . Difficulty of Paying Living Expenses: Not on file  Food Insecurity:   . Worried About Charity fundraiser in the Last Year: Not on file  . Ran Out of Food in the Last Year: Not on file  Transportation Needs:   . Lack of Transportation (Medical): Not on file  . Lack of Transportation (Non-Medical): Not  on file  Physical Activity:   . Days of Exercise per Week: Not on file  . Minutes of Exercise per Session: Not on file  Stress:   . Feeling of Stress : Not on file  Social Connections:   . Frequency of Communication with Friends and Family: Not on file  . Frequency of Social Gatherings with Friends and Family: Not on file  . Attends Religious Services: Not on file  . Active Member of Clubs or Organizations: Not on file  . Attends Archivist Meetings: Not on file  . Marital Status: Not on file  Intimate Partner Violence:   . Fear of Current or Ex-Partner: Not on file  . Emotionally Abused: Not on file  . Physically Abused: Not on file  . Sexually Abused: Not on file    Current Outpatient Medications on File Prior to Visit  Medication Sig Dispense Refill  . famotidine (PEPCID) 20 MG tablet Take 1 tablet (20 mg total) by mouth 2 (two) times daily. 30 tablet 0  . glucose blood (ACCU-CHEK AVIVA PLUS) test strip 1 each by Other route 4 (four) times daily. And lancets 4/day 360 each 3  . Insulin Pen Needle (B-D UF III MINI  PEN NEEDLES) 31G X 5 MM MISC 1 Device by Does not apply route 3 (three) times daily. 100 each 11  . Multiple Vitamin (MULTIVITAMIN WITH MINERALS) TABS tablet Take 1 tablet by mouth daily.    Marland Kitchen omeprazole (PRILOSEC) 20 MG capsule Take 1 capsule (20 mg total) by mouth daily. 30 capsule 0   No current facility-administered medications on file prior to visit.    No Known Allergies  Family History  Problem Relation Age of Onset  . Heart disease Mother        bi-pass  . Hypertension Mother   . Cancer Brother        unsure type  . Hypertension Father   . Colon cancer Neg Hx     BP 128/80 (BP Location: Left Arm, Patient Position: Sitting, Cuff Size: Normal)   Pulse 79   Ht 5\' 11"  (1.803 m)   Wt 176 lb 6.4 oz (80 kg)   SpO2 94%   BMI 24.60 kg/m    Review of Systems He denies hypoglycemia    Objective:   Physical Exam VITAL SIGNS:  See vs  page GENERAL: no distress Pulses: dorsalis pedis intact bilat.   MSK: no deformity of the feet CV: no leg edema Skin:  no ulcer on the feet.  normal color and temp on the feet. Neuro: sensation is intact to touch on the feet, but decreased from normal  Lab Results  Component Value Date   HGBA1C 8.1 (A) 12/12/2019       Assessment & Plan:  Type 1 DM: worse Lean body habitus: in this setting, he'll need to add basal insulin, but not yet.   Patient Instructions  please increase Novolog to 6 units 3 times a day (just before each meal).   check your blood sugar 4 times a day.  vary the time of day when you check, between before the 3 meals, and at bedtime.  also check if you have symptoms of your blood sugar being too high or too low.  please keep a record of the readings and bring it to your next appointment here (or you can bring the meter itself).  You can write it on any piece of paper.  please call 12/14/2019 sooner if your blood sugar goes below 70, or if you have a lot of readings over 200. Please come back for a follow-up appointment in 2 months.

## 2020-02-06 ENCOUNTER — Other Ambulatory Visit: Payer: Self-pay

## 2020-02-10 ENCOUNTER — Other Ambulatory Visit: Payer: Self-pay

## 2020-02-10 ENCOUNTER — Encounter: Payer: Self-pay | Admitting: Endocrinology

## 2020-02-10 ENCOUNTER — Ambulatory Visit: Payer: Medicaid Other | Admitting: Endocrinology

## 2020-02-10 VITALS — BP 120/70 | HR 74 | Ht 71.0 in | Wt 175.0 lb

## 2020-02-10 DIAGNOSIS — E1042 Type 1 diabetes mellitus with diabetic polyneuropathy: Secondary | ICD-10-CM

## 2020-02-10 LAB — POCT GLYCOSYLATED HEMOGLOBIN (HGB A1C): Hemoglobin A1C: 7.7 % — AB (ref 4.0–5.6)

## 2020-02-10 MED ORDER — NOVOLOG FLEXPEN 100 UNIT/ML ~~LOC~~ SOPN: PEN_INJECTOR | SUBCUTANEOUS | 11 refills | Status: DC

## 2020-02-10 NOTE — Progress Notes (Signed)
Subjective:    Patient ID: Patrick [name], male    DOB: [birth year], 59 y.o.   MRN: [id number]  HPI Pt returns for f/u of diabetes mellitus:   DM type: 1, vs chronic pancreatitis Dx'ed: 2015.  Complications: painful PN  Therapy: insulin since 2020 DKA: only once, at time of dx.  Severe hypoglycemia: never.  Pancreatitis: never.  Pancreatic imaging (2007): pancreatic Ca++ Other: in 2017, he went into partial remission; he was off insulin 2018-2020; he stopped drinking EtOH in 2010; he takes multiple daily injections.     Interval history: no cbg record, but states cbg's vary from 80-190.  It is in general lowest at lunch.  pt states he feels well in general.   Past Medical History:  Diagnosis Date  . Cirrhosis ([place])   . Diabetes ([place])    type 1  . ETOH abuse     Past Surgical History:  Procedure Laterality Date  . MOUTH SURGERY     on gums  . TONSILLECTOMY      Social History   Socioeconomic History  . Marital status: Divorced    Spouse name: Not on file  . Number of children: 1  . Years of education: Not on file  . Highest education level: Not on file  Occupational History  . Occupation: unemployed  Tobacco Use  . Smoking status: Current Every Day Smoker    Packs/day: 0.50    Years: 15.00    Pack years: 7.50    Types: Cigarettes  . Smokeless tobacco: Never Used  . Tobacco comment: tobacco info given 03/14/14  Substance and Sexual Activity  . Alcohol use: No  . Drug use: No  . Sexual activity: Not on file  Other Topics Concern  . Not on file  Social History Narrative  . Not on file   Social Determinants of Health   Financial Resource Strain:   . Difficulty of Paying Living Expenses:   Food Insecurity:   . Worried About Charity fundraiser in the Last Year:   . Arboriculturist in the Last Year:   Transportation Needs:   . Film/video editor (Medical):   Marland Kitchen Lack of Transportation (Non-Medical):   Physical Activity:   . Days of Exercise per Week:     . Minutes of Exercise per Session:   Stress:   . Feeling of Stress :   Social Connections:   . Frequency of Communication with Friends and Family:   . Frequency of Social Gatherings with Friends and Family:   . Attends Religious Services:   . Active Member of Clubs or Organizations:   . Attends Archivist Meetings:   Marland Kitchen Marital Status:   Intimate Partner Violence:   . Fear of Current or Ex-Partner:   . Emotionally Abused:   Marland Kitchen Physically Abused:   . Sexually Abused:     Current Outpatient Medications on File Prior to Visit  Medication Sig Dispense Refill  . famotidine (PEPCID) 20 MG tablet Take 1 tablet (20 mg total) by mouth 2 (two) times daily. 30 tablet 0  . glucose blood (ACCU-CHEK AVIVA PLUS) test strip 1 each by Other route 4 (four) times daily. And lancets 4/day 360 each 3  . Insulin Pen Needle (B-D UF III MINI PEN NEEDLES) 31G X 5 MM MISC 1 Device by Does not apply route 3 (three) times daily. 100 each 11  . Multiple Vitamin (MULTIVITAMIN WITH MINERALS) TABS tablet Take 1 tablet by mouth daily.    Marland Kitchen  omeprazole (PRILOSEC) 20 MG capsule Take 1 capsule (20 mg total) by mouth daily. 30 capsule 0   No current facility-administered medications on file prior to visit.    No Known Allergies  Family History  Problem Relation Age of Onset  . Heart disease Mother        bi-pass  . Hypertension Mother   . Cancer Brother        unsure type  . Hypertension Father   . Colon cancer Neg Hx     BP 120/70   Pulse 74   Ht 5\' 11"  (1.803 m)   Wt 175 lb (79.4 kg)   SpO2 98%   BMI 24.41 kg/m    Review of Systems He denies hypoglycemia.      Objective:   Physical Exam VITAL SIGNS:  See vs page GENERAL: no distress Pulses: dorsalis pedis intact bilat.   MSK: no deformity of the feet CV: no leg edema Skin:  no ulcer on the feet.  normal color and temp on the feet.  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  He needs increased rx.  He'll need resumption of basal insulin soon

## 2020-02-10 NOTE — Patient Instructions (Addendum)
please increase Novolog to 3 times a day (just before each meal), 5-7-7 units.  check your blood sugar 4 times a day.  vary the time of day when you check, between before the 3 meals, and at bedtime.  also check if you have symptoms of your blood sugar being too high or too low.  please keep a record of the readings and bring it to your next appointment here (or you can bring the meter itself).  You can write it on any piece of paper.  please call us sooner if your blood sugar goes below 70, or if you have a lot of readings over 200. Please come back for a follow-up appointment in 2 months.

## 2020-02-16 ENCOUNTER — Other Ambulatory Visit: Payer: Self-pay | Admitting: Endocrinology

## 2020-02-24 ENCOUNTER — Telehealth: Payer: Self-pay | Admitting: Endocrinology

## 2020-02-24 NOTE — Telephone Encounter (Signed)
Are you taking any type of steroids? No  Are you sick or becoming sick? No  Is this the first elevated blood sugar reading? N/A  Have you tried to correct it with insulin? N/A  Have you been taking/taken today all of the prescribed medications? Yes  What is your current diabetic insulin or oral medication dosage? Novolog Flexpen 5-7-7 but has adjusted to 5-5-5   Date Time Reading Notes       4/2 9a 134   4/2 12p 151 Ate then took the 7 units  4/2 2p 70   4/2 7p 138   4/3 9a 138   4/3 12p 144 Ate then took the 7 units  4/3 2p 60   4/3 7p 135   4/4 9a 132   4/4 12p 154 Ate then took the 7 units  4/4 7p 140   4/5 9a 133   4/5 2p 144 This is after taking 5 units rather than 7 units         Please advise

## 2020-02-24 NOTE — Telephone Encounter (Signed)
Patient requests to be called at ph# [phone] or ph# [phone] re: Patient states he is taking too much Novolog. Patient's blood sugars went down to 50 on 02/23/20.

## 2020-02-24 NOTE — Telephone Encounter (Signed)
Pt returned call but was not able to provide his CBG's. Pt was advised to call me back once he has his meter.

## 2020-02-24 NOTE — Telephone Encounter (Signed)
please contact patient: Is the 2 PM cbg after 5 units or 7 units?

## 2020-02-24 NOTE — Telephone Encounter (Signed)
LVM requesting returned call 

## 2020-02-24 NOTE — Telephone Encounter (Signed)
The 2pm CBG was after taking 7 units. Since then, he has self adjusted insulin to 5-5-5.

## 2020-02-24 NOTE — Telephone Encounter (Signed)
Please continue the same: 5 units, 3 times a day (just before each meal)

## 2020-02-25 NOTE — Telephone Encounter (Signed)
Called pt and informed of Dr. Ellison's orders. Verbalized acceptance and understanding. 

## 2020-03-04 ENCOUNTER — Other Ambulatory Visit: Payer: Self-pay | Admitting: Endocrinology

## 2020-03-23 ENCOUNTER — Other Ambulatory Visit: Payer: Self-pay | Admitting: Endocrinology

## 2020-04-16 ENCOUNTER — Other Ambulatory Visit: Payer: Self-pay

## 2020-04-21 ENCOUNTER — Encounter: Payer: Self-pay | Admitting: Endocrinology

## 2020-04-21 ENCOUNTER — Other Ambulatory Visit: Payer: Self-pay

## 2020-04-21 ENCOUNTER — Ambulatory Visit (INDEPENDENT_AMBULATORY_CARE_PROVIDER_SITE_OTHER): Payer: Medicaid Other | Admitting: Endocrinology

## 2020-04-21 VITALS — BP 126/80 | HR 75 | Ht 71.0 in | Wt 175.0 lb

## 2020-04-21 DIAGNOSIS — E1042 Type 1 diabetes mellitus with diabetic polyneuropathy: Secondary | ICD-10-CM | POA: Diagnosis not present

## 2020-04-21 LAB — POCT GLYCOSYLATED HEMOGLOBIN (HGB A1C): Hemoglobin A1C: 7.3 % — AB (ref 4.0–5.6)

## 2020-04-21 MED ORDER — NOVOLOG FLEXPEN 100 UNIT/ML ~~LOC~~ SOPN: 5.0000 [IU] | PEN_INJECTOR | Freq: Three times a day (TID) | SUBCUTANEOUS | 3 refills | Status: DC

## 2020-04-21 NOTE — Patient Instructions (Addendum)
Please continue the same Novolog. check your blood sugar 4 times a day.  vary the time of day when you check, between before the 3 meals, and at bedtime.  also check if you have symptoms of your blood sugar being too high or too low.  please keep a record of the readings and bring it to your next appointment here (or you can bring the meter itself).  You can write it on any piece of paper.  please call us sooner if your blood sugar goes below 70, or if you have a lot of readings over 200. Please come back for a follow-up appointment in 3 months.

## 2020-04-21 NOTE — Progress Notes (Signed)
Subjective:    Patient ID: Patrick [name], male    DOB: November 17, 1961, 59 y.o.   MRN: [id number]  HPI Pt returns for f/u of diabetes mellitus:   DM type: 1, vs chronic pancreatitis Dx'ed: 2015.  Complications: painful PN Therapy: insulin since 2020 DKA: only once, at time of dx.  Severe hypoglycemia: never.  Pancreatitis: never.  Pancreatic imaging (2007): pancreatic Ca++ Other: in 2017, he went into partial remission; he was off insulin 2018-2020; he stopped drinking EtOH in 2010; he takes multiple daily injections.     Interval history: no cbg record, but states cbg's vary from 70-170.  There is no trend throughout the day.  pt states he feels well in general.  He takes 5 units 3 times a day (just before each meal).  Past Medical History:  Diagnosis Date  . Cirrhosis ([place])   . Diabetes ([place])    type 1  . ETOH abuse     Past Surgical History:  Procedure Laterality Date  . MOUTH SURGERY     on gums  . TONSILLECTOMY      Social History   Socioeconomic History  . Marital status: Divorced    Spouse name: Not on file  . Number of children: 1  . Years of education: Not on file  . Highest education level: Not on file  Occupational History  . Occupation: unemployed  Tobacco Use  . Smoking status: Current Every Day Smoker    Packs/day: 0.50    Years: 15.00    Pack years: 7.50    Types: Cigarettes  . Smokeless tobacco: Never Used  . Tobacco comment: tobacco info given 03/14/14  Substance and Sexual Activity  . Alcohol use: No  . Drug use: No  . Sexual activity: Not on file  Other Topics Concern  . Not on file  Social History Narrative  . Not on file   Social Determinants of Health   Financial Resource Strain:   . Difficulty of Paying Living Expenses:   Food Insecurity:   . Worried About Charity fundraiser in the Last Year:   . Arboriculturist in the Last Year:   Transportation Needs:   . Film/video editor (Medical):   Marland Kitchen Lack of Transportation  (Non-Medical):   Physical Activity:   . Days of Exercise per Week:   . Minutes of Exercise per Session:   Stress:   . Feeling of Stress :   Social Connections:   . Frequency of Communication with Friends and Family:   . Frequency of Social Gatherings with Friends and Family:   . Attends Religious Services:   . Active Member of Clubs or Organizations:   . Attends Archivist Meetings:   Marland Kitchen Marital Status:   Intimate Partner Violence:   . Fear of Current or Ex-Partner:   . Emotionally Abused:   Marland Kitchen Physically Abused:   . Sexually Abused:     Current Outpatient Medications on File Prior to Visit  Medication Sig Dispense Refill  . ACCU-CHEK AVIVA PLUS test strip 1 EACH BY OTHER ROUTE 4 (FOUR) TIMES DAILY. AND LANCETS 4/DAY 100 strip 13  . famotidine (PEPCID) 20 MG tablet Take 1 tablet (20 mg total) by mouth 2 (two) times daily. 30 tablet 0  . Insulin Pen Needle (B-D UF III MINI PEN NEEDLES) 31G X 5 MM MISC 1 each by Other route 3 (three) times daily. 90 each 2  . Multiple Vitamin (MULTIVITAMIN WITH MINERALS) TABS tablet  Take 1 tablet by mouth daily.    Marland Kitchen omeprazole (PRILOSEC) 20 MG capsule Take 1 capsule (20 mg total) by mouth daily. 30 capsule 0   No current facility-administered medications on file prior to visit.    No Known Allergies  Family History  Problem Relation Age of Onset  . Heart disease Mother        bi-pass  . Hypertension Mother   . Cancer Brother        unsure type  . Hypertension Father   . Colon cancer Neg Hx     BP 126/80   Pulse 75   Ht 5\' 11"  (1.803 m)   Wt 175 lb (79.4 kg)   SpO2 98%   BMI 24.41 kg/m    Review of Systems He denies hypoglycemia    Objective:   Physical Exam VITAL SIGNS:  See vs page GENERAL: no distress Pulses: dorsalis pedis intact bilat.   MSK: no deformity of the feet CV: no leg edema Skin:  no ulcer on the feet.  normal color and temp on the feet. Neuro: sensation is intact to touch on the feet.   Lab  Results  Component Value Date   HGBA1C 7.3 (A) 04/21/2020        Assessment & Plan:  Type 1 DM: well-controlled  Patient Instructions  Please continue the same Novolog. check your blood sugar 4 times a day.  vary the time of day when you check, between before the 3 meals, and at bedtime.  also check if you have symptoms of your blood sugar being too high or too low.  please keep a record of the readings and bring it to your next appointment here (or you can bring the meter itself).  You can write it on any piece of paper.  please call 06/21/2020 sooner if your blood sugar goes below 70, or if you have a lot of readings over 200. Please come back for a follow-up appointment in 3 months.

## 2020-06-17 ENCOUNTER — Other Ambulatory Visit: Payer: Self-pay | Admitting: Endocrinology

## 2020-07-22 ENCOUNTER — Other Ambulatory Visit (INDEPENDENT_AMBULATORY_CARE_PROVIDER_SITE_OTHER): Payer: Medicaid Other

## 2020-07-22 ENCOUNTER — Ambulatory Visit (INDEPENDENT_AMBULATORY_CARE_PROVIDER_SITE_OTHER): Payer: Medicaid Other | Admitting: Endocrinology

## 2020-07-22 ENCOUNTER — Other Ambulatory Visit: Payer: Self-pay

## 2020-07-22 ENCOUNTER — Encounter: Payer: Self-pay | Admitting: Endocrinology

## 2020-07-22 VITALS — BP 128/82 | HR 80 | Ht 71.0 in

## 2020-07-22 DIAGNOSIS — E1042 Type 1 diabetes mellitus with diabetic polyneuropathy: Secondary | ICD-10-CM | POA: Diagnosis not present

## 2020-07-22 LAB — BASIC METABOLIC PANEL
BUN: 16 mg/dL (ref 6–23)
CO2: 28 mEq/L (ref 19–32)
Calcium: 9.7 mg/dL (ref 8.4–10.5)
Chloride: 98 mEq/L (ref 96–112)
Creatinine, Ser: 1.16 mg/dL (ref 0.40–1.50)
GFR: 78.21 mL/min (ref >60.00)
Glucose, Bld: 198 mg/dL — ABNORMAL HIGH (ref 70–99)
Potassium: 4 mEq/L (ref 3.5–5.1)
Sodium: 135 mEq/L (ref 135–145)

## 2020-07-22 LAB — TSH: TSH: 1.86 u[IU]/mL (ref 0.35–4.50)

## 2020-07-22 LAB — HEMOGLOBIN A1C: Hgb A1c MFr Bld: 7.7 % — ABNORMAL HIGH (ref 4.6–6.5)

## 2020-07-22 MED ORDER — NOVOLOG FLEXPEN 100 UNIT/ML ~~LOC~~ SOPN: 6.0000 [IU] | PEN_INJECTOR | Freq: Three times a day (TID) | SUBCUTANEOUS | 11 refills | Status: DC

## 2020-07-22 MED ORDER — FREESTYLE LIBRE 14 DAY READER DEVI: 1.0000 | Freq: Once | 0 refills | Status: DC

## 2020-07-22 MED ORDER — FREESTYLE LIBRE 14 DAY SENSOR MISC: 1.0000 | 3 refills | Status: DC

## 2020-07-22 NOTE — Patient Instructions (Addendum)
Blood tests are requested for you today.  We'll let you know about the results.  check your blood sugar 4 times a day.  vary the time of day when you check, between before the 3 meals, and at bedtime.  also check if you have symptoms of your blood sugar being too high or too low.  please keep a record of the readings and bring it to your next appointment here (or you can bring the meter itself).  You can write it on any piece of paper.  please call us sooner if your blood sugar goes below 70, or if you have a lot of readings over 200. Please come back for a follow-up appointment in 3 months.

## 2020-07-22 NOTE — Progress Notes (Signed)
Subjective:    Patient ID: Patrick [name], male    DOB: 16-Apr-1961, 59 y.o.   MRN: [id number]  HPI Pt returns for f/u of diabetes mellitus:   DM type: 1, vs chronic pancreatitis Dx'ed: 2015.  Complications: painful PN Therapy: insulin since 2020 DKA: only once, at time of dx.  Severe hypoglycemia: never.  Pancreatitis: never.  Pancreatic imaging (2007): pancreatic Ca++ Other: in 2017, he went into partial remission; he was off insulin 2018-2020; he stopped drinking EtOH in 2010; he takes multiple daily injections.     Interval history: no cbg record, but states cbg's vary from 70-100's.  There is no trend throughout the day, but is it lowest with activity.  pt states he feels well in general.  He takes insulin as rx'ed.   Past Medical History:  Diagnosis Date  . Cirrhosis ([facility])   . Diabetes ([facility])    type 1  . ETOH abuse     Past Surgical History:  Procedure Laterality Date  . MOUTH SURGERY     on gums  . TONSILLECTOMY      Social History   Socioeconomic History  . Marital status: Divorced    Spouse name: Not on file  . Number of children: 1  . Years of education: Not on file  . Highest education level: Not on file  Occupational History  . Occupation: unemployed  Tobacco Use  . Smoking status: Current Every Day Smoker    Packs/day: 0.50    Years: 15.00    Pack years: 7.50    Types: Cigarettes  . Smokeless tobacco: Never Used  . Tobacco comment: tobacco info given 03/14/14  Substance and Sexual Activity  . Alcohol use: No  . Drug use: No  . Sexual activity: Not on file  Other Topics Concern  . Not on file  Social History Narrative  . Not on file   Social Determinants of Health   Financial Resource Strain:   . Difficulty of Paying Living Expenses: Not on file  Food Insecurity:   . Worried About Programme researcher, broadcasting/film/video in the Last Year: Not on file  . Ran Out of Food in the Last Year: Not on file  Transportation Needs:   . Lack of Transportation (Medical):  Not on file  . Lack of Transportation (Non-Medical): Not on file  Physical Activity:   . Days of Exercise per Week: Not on file  . Minutes of Exercise per Session: Not on file  Stress:   . Feeling of Stress : Not on file  Social Connections:   . Frequency of Communication with Friends and Family: Not on file  . Frequency of Social Gatherings with Friends and Family: Not on file  . Attends Religious Services: Not on file  . Active Member of Clubs or Organizations: Not on file  . Attends Banker Meetings: Not on file  . Marital Status: Not on file  Intimate Partner Violence:   . Fear of Current or Ex-Partner: Not on file  . Emotionally Abused: Not on file  . Physically Abused: Not on file  . Sexually Abused: Not on file    Current Outpatient Medications on File Prior to Visit  Medication Sig Dispense Refill  . ACCU-CHEK AVIVA PLUS test strip 1 EACH BY OTHER ROUTE 4 (FOUR) TIMES DAILY. AND LANCETS 4/DAY 100 strip 13  . famotidine (PEPCID) 20 MG tablet Take 1 tablet (20 mg total) by mouth 2 (two) times daily. 30 tablet 0  .  Insulin Pen Needle (B-D UF III MINI PEN NEEDLES) 31G X 5 MM MISC 1 each by Other route 3 (three) times daily. E11.9 300 each 0  . Multiple Vitamin (MULTIVITAMIN WITH MINERALS) TABS tablet Take 1 tablet by mouth daily.    Marland Kitchen omeprazole (PRILOSEC) 20 MG capsule Take 1 capsule (20 mg total) by mouth daily. 30 capsule 0   No current facility-administered medications on file prior to visit.    No Known Allergies  Family History  Problem Relation Age of Onset  . Heart disease Mother        bi-pass  . Hypertension Mother   . Cancer Brother        unsure type  . Hypertension Father   . Colon cancer Neg Hx     BP 128/82   Pulse 80   Ht 5\' 11"  (1.803 m)   SpO2 97%   BMI 24.41 kg/m    Review of Systems He denies hypoglycemia.     Objective:   Physical Exam VITAL SIGNS:  See vs page GENERAL: no distress Pulses: dorsalis pedis intact bilat.    MSK: no deformity of the feet CV: no leg edema Skin:  no ulcer on the feet.  normal color and temp on the feet. Neuro: sensation is intact to touch on the feet, but decreased from normal.   Ext: there is bilateral onychomycosis of the toenails.    Lab Results  Component Value Date   HGBA1C 7.3 (A) 04/21/2020       Assessment & Plan:  Type 1 DM: uncontrolled.  increase the insulin to 6 units 3 times a day (just before each meal).  Please come back for a follow-up appointment in 3 months.

## 2020-08-07 ENCOUNTER — Other Ambulatory Visit: Payer: Self-pay | Admitting: Endocrinology

## 2020-10-09 ENCOUNTER — Telehealth: Payer: Self-pay | Admitting: Endocrinology

## 2020-10-09 NOTE — Telephone Encounter (Signed)
Patient called re: Patient's insurance does not cover the 14 day Freestyle Libre and patient states Dr. Everardo All told patient to let him know if that happens so that Dr. Everardo All can take the necessary action in order for patient's insurance to approve the 14 day Freestyle Libre.

## 2020-10-09 NOTE — Telephone Encounter (Signed)
Please advise 

## 2020-10-09 NOTE — Telephone Encounter (Signed)
Please start PA process.  Thank you. 

## 2020-10-12 NOTE — Telephone Encounter (Signed)
[employer] is processing your PA request and will respond shortly with next steps. You may close this dialog, return to your dashboard, and perform other tasks. To check for an update later, open this request again from your dashboard.  If you need assistance, please chat with CoverMyMeds or call us at [phone].  Key: [id number] help? Call us at [phone] Status Sent to [employer] FreeStyle Libre 14 Day Sensor Form [facility] Medicaid of [facility] Electronic Prior Authorization Request Form [phone] [place])

## 2020-10-12 NOTE — Telephone Encounter (Signed)
Attempted prior authorization but do not have enough information.  Pharmacy will have to initiate prior authorization via cover my meds to process.

## 2020-10-22 ENCOUNTER — Telehealth: Payer: Self-pay

## 2020-10-22 DIAGNOSIS — E1042 Type 1 diabetes mellitus with diabetic polyneuropathy: Secondary | ICD-10-CM

## 2020-10-22 MED ORDER — DEXCOM G6 SENSOR MISC: 1.0000 | 3 refills | Status: DC

## 2020-10-22 MED ORDER — DEXCOM G6 TRANSMITTER MISC: 1.0000 | Freq: Once | 1 refills | Status: AC

## 2020-10-22 MED ORDER — DEXCOM G6 RECEIVER DEVI: 1.0000 | Freq: Once | 1 refills | Status: DC

## 2020-10-22 NOTE — Telephone Encounter (Signed)
Noted. Thanks.

## 2020-10-22 NOTE — Telephone Encounter (Signed)
OK, I have sent a prescription to your pharmacy, to change 

## 2020-10-22 NOTE — Telephone Encounter (Signed)
Received notification from insurance- Patrick [name] was denied on insurance- attempted PA, it was denied as well. Patient may have to try Dexcom.   Thanks!

## 2020-10-29 ENCOUNTER — Telehealth: Payer: Self-pay | Admitting: Endocrinology

## 2020-10-29 NOTE — Telephone Encounter (Signed)
1.  Please sched f/u appt 2.  Then pending appt, I'll do the form

## 2020-10-29 NOTE — Progress Notes (Signed)
prior authorization submitted via fax to Medicaid.  Waiting response.

## 2020-10-30 NOTE — Telephone Encounter (Signed)
LVM and sent mychart message to schedule f/u

## 2020-11-03 ENCOUNTER — Other Ambulatory Visit: Payer: Self-pay

## 2020-11-03 ENCOUNTER — Ambulatory Visit (INDEPENDENT_AMBULATORY_CARE_PROVIDER_SITE_OTHER): Payer: Medicaid Other | Admitting: Endocrinology

## 2020-11-03 ENCOUNTER — Encounter: Payer: Self-pay | Admitting: Endocrinology

## 2020-11-03 VITALS — BP 140/86 | HR 80 | Ht 71.0 in | Wt 175.0 lb

## 2020-11-03 DIAGNOSIS — E1042 Type 1 diabetes mellitus with diabetic polyneuropathy: Secondary | ICD-10-CM | POA: Diagnosis not present

## 2020-11-03 LAB — POCT GLYCOSYLATED HEMOGLOBIN (HGB A1C): Hemoglobin A1C: 7.5 % — AB (ref 4.0–5.6)

## 2020-11-03 MED ORDER — LANTUS SOLOSTAR 100 UNIT/ML ~~LOC~~ SOPN: 2.0000 [IU] | PEN_INJECTOR | Freq: Every day | SUBCUTANEOUS | 99 refills | Status: DC

## 2020-11-03 MED ORDER — NOVOLOG FLEXPEN 100 UNIT/ML ~~LOC~~ SOPN: 5.0000 [IU] | PEN_INJECTOR | Freq: Three times a day (TID) | SUBCUTANEOUS | 11 refills | Status: DC

## 2020-11-03 NOTE — Progress Notes (Signed)
Subjective:    Patient ID: Patrick [name], male    DOB: 07/19/1961, 59 y.o.   MRN: [id number]  HPI Pt returns for f/u of diabetes mellitus:   DM type: 1, vs chronic pancreatitis Dx'ed: 2015.  Complications: painful PN Therapy: insulin since 2020 DKA: only once, at time of dx.  Severe hypoglycemia: never.  Pancreatitis: never.  Pancreatic imaging (2007): pancreatic Ca++ Other: in 2017, he went into partial remission; he was off insulin 2018-2020; he stopped drinking EtOH in 2010; he takes multiple daily injections.     Interval history: no cbg record, but states cbg's vary from 77-200's.  There is no trend throughout the day, but is it lowest with activity.  pt states he feels well in general.  He takes insulin as rx'ed.   He seldom has hypoglycemia, and these episodes are mild. He takes Novolog, 5 units 3 times a day (just before each meal).   Past Medical History:  Diagnosis Date  . Cirrhosis ([facility])   . Diabetes ([facility])    type 1  . ETOH abuse     Past Surgical History:  Procedure Laterality Date  . MOUTH SURGERY     on gums  . TONSILLECTOMY      Social History   Socioeconomic History  . Marital status: Divorced    Spouse name: Not on file  . Number of children: 1  . Years of education: Not on file  . Highest education level: Not on file  Occupational History  . Occupation: unemployed  Tobacco Use  . Smoking status: Current Every Day Smoker    Packs/day: 0.50    Years: 15.00    Pack years: 7.50    Types: Cigarettes  . Smokeless tobacco: Never Used  . Tobacco comment: tobacco info given 03/14/14  Substance and Sexual Activity  . Alcohol use: No  . Drug use: No  . Sexual activity: Not on file  Other Topics Concern  . Not on file  Social History Narrative  . Not on file   Social Determinants of Health   Financial Resource Strain: Not on file  Food Insecurity: Not on file  Transportation Needs: Not on file  Physical Activity: Not on file  Stress: Not on  file  Social Connections: Not on file  Intimate Partner Violence: Not on file    Current Outpatient Medications on File Prior to Visit  Medication Sig Dispense Refill  . ACCU-CHEK AVIVA PLUS test strip 1 EACH BY OTHER ROUTE 4 (FOUR) TIMES DAILY. AND LANCETS 4/DAY 100 strip 13  . B-D UF III MINI PEN NEEDLES 31G X 5 MM MISC USE 3 (THREE) TIMES DAILY. E11.9 300 each 0  . Continuous Blood Gluc Sensor (DEXCOM G6 SENSOR) MISC 1 Device by Does not apply route See admin instructions. Change every 10 days 10 each 3  . famotidine (PEPCID) 20 MG tablet Take 1 tablet (20 mg total) by mouth 2 (two) times daily. 30 tablet 0  . Multiple Vitamin (MULTIVITAMIN WITH MINERALS) TABS tablet Take 1 tablet by mouth daily.    Marland Kitchen omeprazole (PRILOSEC) 20 MG capsule Take 1 capsule (20 mg total) by mouth daily. 30 capsule 0   No current facility-administered medications on file prior to visit.    No Known Allergies  Family History  Problem Relation Age of Onset  . Heart disease Mother        bi-pass  . Hypertension Mother   . Cancer Brother  unsure type  . Hypertension Father   . Colon cancer Neg Hx     BP 140/86   Pulse 80   Ht 5\' 11"  (1.803 m)   Wt 175 lb (79.4 kg)   SpO2 98%   BMI 24.41 kg/m   Review of Systems     Objective:   Physical Exam VITAL SIGNS:  See vs page GENERAL: no distress Pulses: dorsalis pedis intact bilat.   MSK: no deformity of the feet CV: no leg edema Skin:  no ulcer on the feet.  normal color and temp on the feet. Neuro: sensation is intact to touch on the feet.    Lab Results  Component Value Date   HGBA1C 7.5 (A) 11/03/2020       Assessment & Plan:  Type 1 DM: uncertain etiology and prognosis.  Hypoglycemia, due to insulin: I advised pt to consider pump.   Patient Instructions  Blood tests are requested for you today.  Please do on some morning, fasting.  We'll let you know about the results.  check your blood sugar 4 times a day.  vary the time of  day when you check, between before the 3 meals, and at bedtime.  also check if you have symptoms of your blood sugar being too high or too low.  please keep a record of the readings and bring it to your next appointment here (or you can bring the meter itself).  You can write it on any piece of paper.  please call 11/05/2020 sooner if your blood sugar goes below 70, or if you have a lot of readings over 200.   Please continue the same Novolog.   I have sent a prescription to your pharmacy, to add Lantus 2 units at bedtime.   Please come back for a follow-up appointment in 3 months.

## 2020-11-03 NOTE — Patient Instructions (Addendum)
Blood tests are requested for you today.  Please do on some morning, fasting.  We'll let you know about the results.  check your blood sugar 4 times a day.  vary the time of day when you check, between before the 3 meals, and at bedtime.  also check if you have symptoms of your blood sugar being too high or too low.  please keep a record of the readings and bring it to your next appointment here (or you can bring the meter itself).  You can write it on any piece of paper.  please call us sooner if your blood sugar goes below 70, or if you have a lot of readings over 200.   Please continue the same Novolog.   I have sent a prescription to your pharmacy, to add Lantus 2 units at bedtime.   Please come back for a follow-up appointment in 3 months.

## 2020-11-05 ENCOUNTER — Other Ambulatory Visit (INDEPENDENT_AMBULATORY_CARE_PROVIDER_SITE_OTHER): Payer: Medicaid Other

## 2020-11-05 ENCOUNTER — Other Ambulatory Visit: Payer: Self-pay

## 2020-11-05 DIAGNOSIS — E1042 Type 1 diabetes mellitus with diabetic polyneuropathy: Secondary | ICD-10-CM

## 2020-11-05 LAB — TSH: TSH: 1.75 u[IU]/mL (ref 0.35–4.50)

## 2020-11-05 LAB — BASIC METABOLIC PANEL
BUN: 19 mg/dL (ref 6–23)
CO2: 29 mEq/L (ref 19–32)
Calcium: 9.8 mg/dL (ref 8.4–10.5)
Chloride: 101 mEq/L (ref 96–112)
Creatinine, Ser: 1.24 mg/dL (ref 0.40–1.50)
GFR: 64.12 mL/min (ref >60.00)
Glucose, Bld: 137 mg/dL — ABNORMAL HIGH (ref 70–99)
Potassium: 4.4 mEq/L (ref 3.5–5.1)
Sodium: 138 mEq/L (ref 135–145)

## 2020-11-05 NOTE — Addendum Note (Signed)
Addended by: Adline Mango I on: 11/05/2020 08:33 AM   Modules accepted: Orders

## 2020-11-06 LAB — C-PEPTIDE: C-Peptide: 0.62 ng/mL — ABNORMAL LOW (ref 0.80–3.85)

## 2020-12-10 ENCOUNTER — Other Ambulatory Visit: Payer: Self-pay | Admitting: Endocrinology

## 2020-12-14 ENCOUNTER — Other Ambulatory Visit: Payer: Self-pay | Admitting: Endocrinology

## 2020-12-14 DIAGNOSIS — E1042 Type 1 diabetes mellitus with diabetic polyneuropathy: Secondary | ICD-10-CM

## 2021-01-22 ENCOUNTER — Encounter (HOSPITAL_COMMUNITY): Payer: Self-pay

## 2021-01-22 ENCOUNTER — Ambulatory Visit (INDEPENDENT_AMBULATORY_CARE_PROVIDER_SITE_OTHER): Payer: Medicaid Other

## 2021-01-22 ENCOUNTER — Other Ambulatory Visit: Payer: Self-pay

## 2021-01-22 ENCOUNTER — Ambulatory Visit (HOSPITAL_COMMUNITY)
Admission: EM | Admit: 2021-01-22 | Discharge: 2021-01-22 | Disposition: A | Discharge status: Home / Self Care | Payer: Medicaid Other | Attending: Family Medicine | Admitting: Family Medicine

## 2021-01-22 DIAGNOSIS — R000 Tachycardia, unspecified: Secondary | ICD-10-CM

## 2021-01-22 DIAGNOSIS — R0602 Shortness of breath: Secondary | ICD-10-CM | POA: Diagnosis not present

## 2021-01-22 DIAGNOSIS — Z79899 Other long term (current) drug therapy: Secondary | ICD-10-CM | POA: Insufficient documentation

## 2021-01-22 DIAGNOSIS — Z794 Long term (current) use of insulin: Secondary | ICD-10-CM | POA: Insufficient documentation

## 2021-01-22 DIAGNOSIS — K7031 Alcoholic cirrhosis of liver with ascites: Secondary | ICD-10-CM | POA: Diagnosis not present

## 2021-01-22 DIAGNOSIS — Z20822 Contact with and (suspected) exposure to covid-19: Secondary | ICD-10-CM | POA: Diagnosis not present

## 2021-01-22 DIAGNOSIS — F17210 Nicotine dependence, cigarettes, uncomplicated: Secondary | ICD-10-CM | POA: Diagnosis not present

## 2021-01-22 DIAGNOSIS — R112 Nausea with vomiting, unspecified: Secondary | ICD-10-CM

## 2021-01-22 DIAGNOSIS — R1012 Left upper quadrant pain: Secondary | ICD-10-CM | POA: Diagnosis not present

## 2021-01-22 DIAGNOSIS — E1069 Type 1 diabetes mellitus with other specified complication: Secondary | ICD-10-CM

## 2021-01-22 LAB — CBC WITH DIFFERENTIAL/PLATELET
Abs Immature Granulocytes: 0.03 10*3/uL (ref 0.00–0.07)
Basophils Absolute: 0 10*3/uL (ref 0.0–0.1)
Basophils Relative: 0 %
Eosinophils Absolute: 0 10*3/uL (ref 0.0–0.5)
Eosinophils Relative: 0 %
HCT: 31.2 % — ABNORMAL LOW (ref 39.0–52.0)
Hemoglobin: 10.9 g/dL — ABNORMAL LOW (ref 13.0–17.0)
Immature Granulocytes: 0 %
Lymphocytes Relative: 21 %
Lymphs Abs: 2.1 10*3/uL (ref 0.7–4.0)
MCH: 32.1 pg (ref 26.0–34.0)
MCHC: 34.9 g/dL (ref 30.0–36.0)
MCV: 91.8 fL (ref 80.0–100.0)
Monocytes Absolute: 0.7 10*3/uL (ref 0.1–1.0)
Monocytes Relative: 7 %
Neutro Abs: 7 10*3/uL (ref 1.7–7.7)
Neutrophils Relative %: 72 %
Platelets: 208 10*3/uL (ref 150–400)
RBC: 3.4 MIL/uL — ABNORMAL LOW (ref 4.22–5.81)
RDW: 13.1 % (ref 11.5–15.5)
WBC: 10 10*3/uL (ref 4.0–10.5)
nRBC: 0 % (ref 0.0–0.2)

## 2021-01-22 LAB — COMPREHENSIVE METABOLIC PANEL
ALT: 32 U/L (ref 0–44)
AST: 24 U/L (ref 15–41)
Albumin: 4.3 g/dL (ref 3.5–5.0)
Alkaline Phosphatase: 53 U/L (ref 38–126)
Anion gap: 10 (ref 5–15)
BUN: 39 mg/dL — ABNORMAL HIGH (ref 6–20)
CO2: 24 mmol/L (ref 22–32)
Calcium: 9.3 mg/dL (ref 8.9–10.3)
Chloride: 101 mmol/L (ref 98–111)
Creatinine, Ser: 1.26 mg/dL — ABNORMAL HIGH (ref 0.61–1.24)
GFR, Estimated: >60 mL/min (ref >60)
Glucose, Bld: 253 mg/dL — ABNORMAL HIGH (ref 70–99)
Potassium: 4.3 mmol/L (ref 3.5–5.1)
Sodium: 135 mmol/L (ref 135–145)
Total Bilirubin: 0.7 mg/dL (ref 0.3–1.2)
Total Protein: 7 g/dL (ref 6.5–8.1)

## 2021-01-22 LAB — POCT URINALYSIS DIPSTICK, ED / UC
Bilirubin Urine: NEGATIVE
Glucose, UA: NEGATIVE mg/dL
Hgb urine dipstick: NEGATIVE
Ketones, ur: NEGATIVE mg/dL
Leukocytes,Ua: NEGATIVE
Nitrite: NEGATIVE
Protein, ur: NEGATIVE mg/dL
Specific Gravity, Urine: 1.01 (ref 1.005–1.030)
Urobilinogen, UA: 0.2 mg/dL (ref 0.0–1.0)
pH: 5 (ref 5.0–8.0)

## 2021-01-22 LAB — D-DIMER, QUANTITATIVE: D-Dimer, Quant: <0.27 ug/mL-FEU (ref 0.00–0.50)

## 2021-01-22 LAB — SARS CORONAVIRUS 2 (TAT 6-24 HRS): SARS Coronavirus 2: NEGATIVE

## 2021-01-22 NOTE — ED Provider Notes (Signed)
[facility]    CSN: [id number] Arrival date & time: 01/22/21  3893      History   Chief Complaint Chief Complaint  Patient presents with  . Shortness of Breath    HPI Patrick [name] is a 60 y.o. male.   Reports 3-day history of shortness of breath on exertion, feeling like his heart is racing as well.  Also reports that over the last 2 days he has vomited 2-3 times after eating.  States he feels like the symptoms were related to the food that he ate.  Reports that he has had history of pneumonia.  Has medical history significant for alcoholic cirrhosis of the liver with ascites, malnutrition, acute renal failure, thrombocytopenia, coagulopathy, hepatic encephalopathy, DKA, type 1 diabetes.  States that he has not had alcohol or cigarettes in the last 10 years since he was diagnosed with cirrhosis.  Reports that he is also having left upper quadrant abdominal pain intermittently.  Has not attempted treatment at home for any of his symptoms other than rest.  Symptoms are relieved by rest, and aggravated with activity.  States that blood sugars have been running around 120s, reports that his blood sugar this morning was 160 but that he did eat a banana.  Denies any changes in urinary frequency, thirst.  Denies headache, diarrhea, changes in vision, changes in sensation, loss of consciousness, cough, fever, rash, other symptoms.  ROS per HPI  The history is provided by the patient.  Shortness of Breath   Past Medical History:  Diagnosis Date  . Cirrhosis (HCC)   . Diabetes (HCC)    type 1  . ETOH abuse     Patient Active Problem List   Diagnosis Date Noted  . Type 1 diabetes mellitus (HCC) 03/22/2014  . Smoker 03/21/2014  . Cirrhosis of liver (HCC) 03/14/2014  . Colon cancer screening 03/14/2014  . Hypotension, unspecified 03/01/2014  . DKA (diabetic ketoacidoses) 02/28/2014  . Hyponatremia 02/28/2014  . Hepatic encephalopathy (HCC) 08/24/2012  . Anemia 08/23/2012   . Malnutrition (HCC) 08/23/2012  . Acute renal failure (HCC) 08/23/2012  . Thrombocytopenia (HCC) 08/23/2012  . Coagulopathy (HCC) 08/23/2012  . Ascites 08/22/2012  . 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 MOUTH SURGERY     on gums  . TONSILLECTOMY         Home Medications    Prior to Admission medications   Medication Sig Start Date End Date Taking? Authorizing Provider  ACCU-CHEK AVIVA PLUS test strip 1 EACH BY OTHER ROUTE 4 (FOUR) TIMES DAILY. AND LANCETS 4/DAY 12/15/20   Romero Belling, MD  B-D UF III MINI PEN NEEDLES 31G X 5 MM MISC USE 3 (THREE) TIMES DAILY. E11.9 12/10/20   Romero Belling, MD  Continuous Blood Gluc Sensor (DEXCOM G6 SENSOR) MISC 1 Device by Does not apply route See admin instructions. Change every 10 days 10/22/20   Romero Belling, MD  famotidine (PEPCID) 20 MG tablet Take 1 tablet (20 mg total) by mouth 2 (two) times daily. 02/14/19   Hedges, Tinnie Gens, PA-C  insulin aspart (NOVOLOG FLEXPEN) 100 UNIT/ML FlexPen Inject 5 Units into the skin 3 (three) times daily with meals. 11/03/20   Romero Belling, MD  insulin glargine (LANTUS SOLOSTAR) 100 UNIT/ML Solostar Pen Inject 2 Units into the skin at bedtime. 11/03/20   Romero Belling, MD  Multiple Vitamin (MULTIVITAMIN WITH MINERALS) TABS tablet Take 1 tablet by mouth daily.  [provider]  omeprazole (PRILOSEC) 20 MG capsule Take 1 capsule (20 mg total) by mouth daily. 02/27/19   Eyvonne Mechanic, PA-C    Family History Family History  Problem Relation Age of Onset  . Heart disease Mother        bi-pass  . Hypertension Mother   . Cancer Brother        unsure type  . Hypertension Father   . Colon cancer Neg Hx     Social History Social History   Tobacco Use  . Smoking status: Current Every Day Smoker    Packs/day: 0.50    Years: 15.00    Pack years: 7.50    Types: Cigarettes  . Smokeless tobacco: Never Used  . Tobacco  comment: tobacco info given 03/14/14  Substance Use Topics  . Alcohol use: No  . Drug use: No     Allergies   Patient has no known allergies.   Review of Systems Review of Systems  Respiratory: Positive for shortness of breath.      Physical Exam Triage Vital Signs ED Triage Vitals  Enc Vitals Group     BP 01/22/21 0951 129/90     Pulse Rate 01/22/21 0951 (!) 140     Resp 01/22/21 0951 20     Temp 01/22/21 0951 98.7 F (37.1 C)     Temp Source 01/22/21 0951 Oral     SpO2 01/22/21 0951 100 %     Weight --      Height --      Head Circumference --      Peak Flow --      Pain Score 01/22/21 0954 0     Pain Loc --      Pain Edu? --      Excl. in GC? --    No data found.  Updated Vital Signs BP 129/90 (BP Location: Left Arm)   Pulse (!) 140   Temp 98.7 F (37.1 C) (Oral)   Resp 20   SpO2 100%    Physical Exam Vitals and nursing note reviewed.  Constitutional:      General: He is not in acute distress.    Appearance: He is well-developed and well-nourished.  HENT:     Head: Normocephalic and atraumatic.     Mouth/Throat:     Mouth: Mucous membranes are moist.     Pharynx: Oropharynx is clear. No pharyngeal swelling or oropharyngeal exudate.  Eyes:     Extraocular Movements: Extraocular movements intact.     Conjunctiva/sclera: Conjunctivae normal.     Pupils: Pupils are equal, round, and reactive to light.  Neck:     Thyroid: No thyromegaly.     Trachea: No tracheal deviation.  Cardiovascular:     Rate and Rhythm: Regular rhythm. Tachycardia present.  No extrasystoles are present.    Heart sounds: No murmur heard.   Pulmonary:     Effort: Pulmonary effort is normal. No tachypnea, bradypnea, accessory muscle usage or respiratory distress.     Breath sounds: No stridor. Examination of the right-lower field reveals decreased breath sounds. Examination of the left-lower field reveals decreased breath sounds. Decreased breath sounds present. No wheezing,  rhonchi or rales.  Chest:     Chest wall: No mass, deformity, tenderness, crepitus or edema. There is no dullness to percussion.  Abdominal:     General: Bowel sounds are normal.     Palpations: Abdomen is soft. There is no hepatomegaly, splenomegaly or mass.  Tenderness: There is no abdominal tenderness. There is no guarding or rebound.  Musculoskeletal:        General: No edema. Normal range of motion.     Cervical back: Normal range of motion and neck supple.     Right lower leg: No tenderness. No edema.     Left lower leg: No tenderness. No edema.  Lymphadenopathy:     Cervical: No cervical adenopathy.  Skin:    General: Skin is warm and dry.     Capillary Refill: Capillary refill takes less than 2 seconds.  Neurological:     General: No focal deficit present.     Mental Status: He is alert and oriented to person, place, and time.  Psychiatric:        Mood and Affect: Mood is anxious.        Behavior: Behavior normal.      UC Treatments / Results  Labs (all labs ordered are listed, but only abnormal results are displayed) Labs Reviewed  SARS CORONAVIRUS 2 (TAT 6-24 HRS)  CBC WITH DIFFERENTIAL/PLATELET  D-DIMER, QUANTITATIVE  COMPREHENSIVE METABOLIC PANEL  POCT URINALYSIS DIPSTICK, ED / UC    EKG   Radiology DG Chest 2 View  Result Date: 01/22/2021 CLINICAL DATA:  Shortness of breath. EXAM: CHEST - 2 VIEW COMPARISON:  None. FINDINGS: The heart size and mediastinal contours are within normal limits. Both lungs are clear. The visualized skeletal structures are unremarkable. IMPRESSION: Negative chest. Electronically Signed   By: Marnee Spring M.D.   On: 01/22/2021 11:04    Procedures Procedures (including critical care time)  Medications Ordered in UC Medications - No data to display  Initial Impression / Assessment and Plan / UC Course  I have reviewed the triage vital signs and the nursing notes.  Pertinent labs & imaging results that were available  during my care of the patient were reviewed by me and considered in my medical decision making (see chart for details).     SOBOE Tachycardia LUQ Pain Vomiting Type 1 Diabetes  EKG today overall reassuring, shows sinus tach with biatrial enlargement, no acute changes Chest x-ray negative UA negative today Discussed that given patient symptoms and history, cannot rule out a PE and discussed that he would be best served in the ER to have blood work done Does not want to go to the ER at this time We will check CBC, CMP, D-dimer Discussed with patient that if D-dimer is positive, needs further evaluation and treatment in the hospital Patient agreeable to checking labs and if D-dimer elevated, he states he to the ER   Final Clinical Impressions(s) / UC Diagnoses   Final diagnoses:  SOBOE (shortness of breath on exertion)  Tachycardia  LUQ pain  Type 1 diabetes mellitus with other specified complication (HCC)  Nausea and vomiting, intractability of vomiting not specified, unspecified vomiting type     Discharge Instructions     Your urine looked great today  Your chest xray was negative for pneumonia  Your EKG today shows tachycardia but no obvious cause for your shortness of breath  We will check your labs and will be in contact with any abnormal labs that need further treatment  If your symptoms get any worse at all, go ahead to the ER for further evaluation and treatment    ED Prescriptions    None     PDMP not reviewed this encounter.   Moshe Cipro, NP 01/22/21 [phone]

## 2021-01-22 NOTE — Discharge Instructions (Signed)
Your urine looked great today  Your chest xray was negative for pneumonia  Your EKG today shows tachycardia but no obvious cause for your shortness of breath  We will check your labs and will be in contact with any abnormal labs that need further treatment  If your symptoms get any worse at all, go ahead to the ER for further evaluation and treatment

## 2021-01-22 NOTE — ED Triage Notes (Signed)
Pt presents with shortness of breath with exertion since yesterday.

## 2021-01-25 ENCOUNTER — Other Ambulatory Visit: Payer: Self-pay

## 2021-01-25 ENCOUNTER — Ambulatory Visit (INDEPENDENT_AMBULATORY_CARE_PROVIDER_SITE_OTHER): Payer: Medicaid Other | Admitting: Endocrinology

## 2021-01-25 VITALS — BP 116/83 | HR 119 | Ht 71.0 in | Wt 165.4 lb

## 2021-01-25 DIAGNOSIS — E1042 Type 1 diabetes mellitus with diabetic polyneuropathy: Secondary | ICD-10-CM | POA: Diagnosis not present

## 2021-01-25 LAB — POCT GLYCOSYLATED HEMOGLOBIN (HGB A1C): Hemoglobin A1C: 6.3 % — AB (ref 4.0–5.6)

## 2021-01-25 NOTE — Patient Instructions (Signed)
check your blood sugar 4 times a day.  vary the time of day when you check, between before the 3 meals, and at bedtime.  also check if you have symptoms of your blood sugar being too high or too low.  please keep a record of the readings and bring it to your next appointment here (or you can bring the meter itself).  You can write it on any piece of paper.  please call us sooner if your blood sugar goes below 70, or if you have a lot of readings over 200.   Please continue the same insulins.   Please come back for a follow-up appointment in 4 months.

## 2021-01-25 NOTE — Progress Notes (Signed)
Subjective:    Patient ID: Patrick [name], male    DOB: 04/23/1961, 60 y.o.   MRN: [id number]  HPI Pt returns for f/u of diabetes mellitus:   DM type: 1, vs chronic pancreatitis Dx'ed: 2015.  Complications: PPN and stage 3 CRI Therapy: insulin since 2020 DKA: only once, at time of dx.  Severe hypoglycemia: never.  Pancreatitis: never.  Pancreatic imaging (2007): pancreatic Ca++ Other: in 2017, he went into partial remission; he was off insulin 2018-2020; he stopped drinking EtOH in 2010; he takes multiple daily injections.     Interval history: no cbg record, but states cbg's vary from 69-250.  There is no trend throughout the day, but is it lowest with activity.  He takes insulin as rx'ed.   He seldom has hypoglycemia, and these episodes are mild. He was seen in ER for sob, but no DKA was found.   Past Medical History:  Diagnosis Date  . Cirrhosis (HCC)   . Diabetes (HCC)    type 1  . ETOH abuse     Past Surgical History:  Procedure Laterality Date  . MOUTH SURGERY     on gums  . TONSILLECTOMY      Social History   Socioeconomic History  . Marital status: Divorced    Spouse name: Not on file  . Number of children: 1  . Years of education: Not on file  . Highest education level: Not on file  Occupational History  . Occupation: unemployed  Tobacco Use  . Smoking status: Current Every Day Smoker    Packs/day: 0.50    Years: 15.00    Pack years: 7.50    Types: Cigarettes  . Smokeless tobacco: Never Used  . Tobacco comment: tobacco info given 03/14/14  Substance and Sexual Activity  . Alcohol use: No  . Drug use: No  . Sexual activity: Not on file  Other Topics Concern  . Not on file  Social History Narrative  . Not on file   Social Determinants of Health   Financial Resource Strain: Not on file  Food Insecurity: Not on file  Transportation Needs: Not on file  Physical Activity: Not on file  Stress: Not on file  Social Connections: Not on file  Intimate  Partner Violence: Not on file    Current Outpatient Medications on File Prior to Visit  Medication Sig Dispense Refill  . ACCU-CHEK AVIVA PLUS test strip 1 EACH BY OTHER ROUTE 4 (FOUR) TIMES DAILY. AND LANCETS 4/DAY 100 strip 13  . B-D UF III MINI PEN NEEDLES 31G X 5 MM MISC USE 3 (THREE) TIMES DAILY. E11.9 300 each 0  . Continuous Blood Gluc Sensor (DEXCOM G6 SENSOR) MISC 1 Device by Does not apply route See admin instructions. Change every 10 days 10 each 3  . famotidine (PEPCID) 20 MG tablet Take 1 tablet (20 mg total) by mouth 2 (two) times daily. 30 tablet 0  . insulin aspart (NOVOLOG FLEXPEN) 100 UNIT/ML FlexPen Inject 5 Units into the skin 3 (three) times daily with meals. 15 mL 11  . insulin glargine (LANTUS SOLOSTAR) 100 UNIT/ML Solostar Pen Inject 2 Units into the skin at bedtime. 15 mL PRN  . Multiple Vitamin (MULTIVITAMIN WITH MINERALS) TABS tablet Take 1 tablet by mouth daily.    Marland Kitchen omeprazole (PRILOSEC) 20 MG capsule Take 1 capsule (20 mg total) by mouth daily. 30 capsule 0   No current facility-administered medications on file prior to visit.    No Known  Allergies  Family History  Problem Relation Age of Onset  . Heart disease Mother        bi-pass  . Hypertension Mother   . Cancer Brother        unsure type  . Hypertension Father   . Colon cancer Neg Hx     BP 116/83 (BP Location: Right Arm, Patient Position: Sitting, Cuff Size: Normal)   Pulse (!) 119   Ht 5\' 11"  (1.803 m)   Wt 165 lb 6.4 oz (75 kg)   SpO2 99%   BMI 23.07 kg/m    Review of Systems     Objective:   Physical Exam VITAL SIGNS:  See vs page GENERAL: no distress Pulses: dorsalis pedis intact bilat.   MSK: no deformity of the feet CV: no leg edema Skin:  no ulcer on the feet.  normal color and temp on the feet. Neuro: sensation is intact to touch on the feet  A1c=6.3%  Lab Results  Component Value Date   CREATININE 1.26 (H) 01/22/2021   BUN 39 (H) 01/22/2021   NA 135 01/22/2021   K  4.3 01/22/2021   CL 101 01/22/2021   CO2 24 01/22/2021   Lab Results  Component Value Date   TSH 1.75 11/05/2020       Assessment & Plan:  Type 1 DM.  Hypoglycemia, due to insulin: this limits aggressiveness of glycemic control.     Patient Instructions  check your blood sugar 4 times a day.  vary the time of day when you check, between before the 3 meals, and at bedtime.  also check if you have symptoms of your blood sugar being too high or too low.  please keep a record of the readings and bring it to your next appointment here (or you can bring the meter itself).  You can write it on any piece of paper.  please call 11/07/2020 sooner if your blood sugar goes below 70, or if you have a lot of readings over 200.   Please continue the same insulins.   Please come back for a follow-up appointment in 4 months.

## 2021-02-02 ENCOUNTER — Ambulatory Visit: Payer: Medicaid Other | Admitting: Endocrinology

## 2021-02-23 ENCOUNTER — Other Ambulatory Visit: Payer: Self-pay | Admitting: Endocrinology

## 2021-02-24 ENCOUNTER — Telehealth (INDEPENDENT_AMBULATORY_CARE_PROVIDER_SITE_OTHER): Payer: Medicaid Other | Admitting: Family

## 2021-02-24 DIAGNOSIS — K08109 Complete loss of teeth, unspecified cause, unspecified class: Secondary | ICD-10-CM

## 2021-02-24 DIAGNOSIS — Z972 Presence of dental prosthetic device (complete) (partial): Secondary | ICD-10-CM | POA: Diagnosis not present

## 2021-02-24 DIAGNOSIS — Z7689 Persons encountering health services in other specified circumstances: Secondary | ICD-10-CM

## 2021-02-24 NOTE — Progress Notes (Signed)
Establish care

## 2021-02-24 NOTE — Progress Notes (Signed)
Virtual Visit via Telephone Note  I connected with Patrick [name], on 02/24/2021 at 4:56 PM by telephone due to the COVID-19 pandemic and verified that I am speaking with the correct person using two identifiers.  Due to current restrictions/limitations of in-office visits due to the COVID-19 pandemic, this scheduled clinical appointment was converted to a telehealth visit.   Consent: I discussed the limitations, risks, security and privacy concerns of performing an evaluation and management service by telephone and the availability of in person appointments. I also discussed with the patient that there may be a patient responsible charge related to this service. The patient expressed understanding and agreed to proceed.  Location of Patient: Home  Location of Provider: [facility]  Persons participating in Telemedicine visit: Gonsalo B Robina Ade, NP Margorie John, CMA  History of Present Illness: Patrick [name] is a 60 year-old male who presents to establish care. PMH significant for hypotension, alcoholic cirrhosis of liver with ascites, diabetic ketoacidosis, type 1 diabetes mellitus, hepatic encephalopathy, acute renal failure, coagulopathy, ascites, anemia, thrombocytopenia, hyponatremia, and smoker.  Current issues and/or concerns: Reports over 10 years of neuropathy, right greater than left, decreased circulation, and was taking Gabapentin which did not help. He is diabetic. Recently saw eye doctor for history of glaucoma and cataracts.  Reports he broke his lower dentures and that both the upper/lower dentures have thinning. Requesting referral to Ned Card, DDS.    Past Medical History:  Diagnosis Date  . Cirrhosis (HCC)   . Diabetes (HCC)    type 1  . Diabetes mellitus without complication (HCC)    [employer] 02/21/2021  . ETOH abuse    No Known Allergies  Current Outpatient Medications on File Prior to Visit  Medication  Sig Dispense Refill  . ACCU-CHEK AVIVA PLUS test strip 1 EACH BY OTHER ROUTE 4 (FOUR) TIMES DAILY. AND LANCETS 4/DAY 100 strip 13  . B-D UF III MINI PEN NEEDLES 31G X 5 MM MISC USE 3 (THREE) TIMES DAILY. 300 each 0  . insulin aspart (NOVOLOG FLEXPEN) 100 UNIT/ML FlexPen Inject 5 Units into the skin 3 (three) times daily with meals. 15 mL 11  . insulin glargine (LANTUS SOLOSTAR) 100 UNIT/ML Solostar Pen Inject 2 Units into the skin at bedtime. 15 mL PRN  . Multiple Vitamin (MULTIVITAMIN WITH MINERALS) TABS tablet Take 1 tablet by mouth daily.     No current facility-administered medications on file prior to visit.    Observations/Objective: Alert and oriented x 3. Not in acute distress. Physical examination not completed as this is a telemedicine visit.  Assessment and Plan: 1. Encounter to establish care: - Patient presents today to establish care.  - Return for annual physical examination, labs, and health maintenance. Arrive fasting meaning having no for at least 8 hours prior to appointment. You may have only water or black coffee. Please take scheduled medications as normal.  2. Full dentures: - Patient requesting new upper and lower dentures.  - Referral to Dentistry for further evaluation and management.  - Ambulatory referral to Dentistry  Follow Up Instructions: Return for annual physical examination.    Patient was given clear instructions to go to Emergency Department or return to medical center if symptoms don't improve, worsen, or new problems develop.The patient verbalized understanding.  I discussed the assessment and treatment plan with the patient. The patient was provided an opportunity to ask questions and all were answered. The patient agreed with the plan  and demonstrated an understanding of the instructions.   The patient was advised to call back or seek an in-person evaluation if the symptoms worsen or if the condition fails to improve as anticipated.    I  provided 15 minutes total of non-face-to-face time during this encounter including median intraservice time, reviewing previous notes, labs, imaging, medications, management and patient verbalized understanding.    [name], NP  [facility] [place], [place] [phone] 02/24/2021, 4:56 PM

## 2021-02-26 ENCOUNTER — Telehealth: Payer: Self-pay | Admitting: Endocrinology

## 2021-02-26 NOTE — Telephone Encounter (Signed)
New Message: PA stating for pt dexcom on Question 6 has the word any and needs to have dexcom checked.   [facility] is sending over another fax with it checked just needs a signature.

## 2021-03-01 NOTE — Telephone Encounter (Signed)
Paper work has been updated and refaxed. ?

## 2021-03-16 ENCOUNTER — Telehealth: Payer: Self-pay

## 2021-03-16 NOTE — Telephone Encounter (Signed)
Ted with [facility] called stating he has been trying to reach the patient in regards to getting the patient's supplies to him, but patient has not responded. I LVM for patient to return their call at [phone] (option 1) extension [phone] Felicity Pellegrini)

## 2021-03-16 NOTE — Telephone Encounter (Signed)
[facility] Referral with Physician's orders has been completed and faxed on 03/16/2021

## 2021-04-06 NOTE — Progress Notes (Signed)
Patient ID: Patrick [name], male    DOB: Mar 30, 1961  MRN: [id number]  CC: Annual Physical Exam  Subjective: Patrick [name] is a 60 y.o. male who presents for annual physical exam. His concerns today include:   Left lower back pain present for years. Denies trauma, injury, surgical history, and radiation. Reports he does not prefer to take medication so will hold for now and notify provider should he change his mind.   Concern for right testicle knot found several months ago. Has increased in size since then. Pain sometimes if touched. Urine stream usually normal but sometimes lighter than normal depending on time of day. Denies scrotal swelling, change in color of scrotal skin, and blood in urine. Concern for prostate cancer as he has a family history.  Requesting referral to Dentistry related to thinning and broken upper and lower dentures.   Reports thinks his blood pressure may be high today related to being in the office.  Patient Active Problem List   Diagnosis Date Noted  . Type 1 diabetes mellitus (HCC) 03/22/2014  . Cirrhosis of liver (HCC) 03/14/2014  . Colon cancer screening 03/14/2014  . Hypotension, unspecified 03/01/2014  . DKA (diabetic ketoacidoses) 02/28/2014  . Hyponatremia 02/28/2014  . Hepatic encephalopathy (HCC) 08/24/2012  . Anemia 08/23/2012  . Malnutrition (HCC) 08/23/2012  . Acute renal failure (HCC) 08/23/2012  . Thrombocytopenia (HCC) 08/23/2012  . Coagulopathy (HCC) 08/23/2012  . Ascites 08/22/2012  . 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     Current Outpatient Medications on File Prior to Visit  Medication Sig Dispense Refill  . ACCU-CHEK AVIVA PLUS test strip 1 EACH BY OTHER ROUTE 4 (FOUR) TIMES DAILY. AND LANCETS 4/DAY 100 strip 13  . B-D UF III MINI PEN NEEDLES 31G X 5 MM MISC USE 3 (THREE) TIMES DAILY. 300 each 0  . insulin aspart (NOVOLOG FLEXPEN) 100 UNIT/ML FlexPen Inject 5 Units into the skin 3 (three) times daily with  meals. 15 mL 11  . insulin glargine (LANTUS SOLOSTAR) 100 UNIT/ML Solostar Pen Inject 2 Units into the skin at bedtime. 15 mL PRN  . latanoprost (XALATAN) 0.005 % ophthalmic solution SMARTSIG:1 Drop(s) In Eye(s) Every Evening    . Multiple Vitamin (MULTIVITAMIN WITH MINERALS) TABS tablet Take 1 tablet by mouth daily.     No current facility-administered medications on file prior to visit.    No Known Allergies  Social History   Socioeconomic History  . Marital status: Single    Spouse name: Not on file  . Number of children: 1  . Years of education: Not on file  . Highest education level: Not on file  Occupational History  . Occupation: unemployed  Tobacco Use  . Smoking status: Current Every Day Smoker    Packs/day: 0.50    Years: 15.00    Pack years: 7.50    Types: Cigarettes  . Smokeless tobacco: Never Used  . Tobacco comment: tobacco info given 03/14/14  Substance and Sexual Activity  . Alcohol use: No  . Drug use: No  . Sexual activity: Not on file  Other Topics Concern  . Not on file  Social History Narrative  . Not on file   Social Determinants of Health   Financial Resource Strain: Not on file  Food Insecurity: Not on file  Transportation Needs: Not on file  Physical Activity: Not on file  Stress: Not on file  Social Connections: Not on file  Intimate Partner Violence: Not on file  Family History  Problem Relation Age of Onset  . Heart disease Mother        bi-pass  . Hypertension Mother   . Cancer Brother        unsure type  . Hypertension Father   . Colon cancer Neg Hx     Past Surgical History:  Procedure Laterality Date  . EYE SURGERY N/A    [employer] 02/21/2021  . MOUTH SURGERY     on gums  . TONSILLECTOMY      ROS: Review of Systems Negative except as stated above  PHYSICAL EXAM: BP (!) 161/51 (BP Location: Left Arm, Patient Position: Sitting, Cuff Size: Normal)   Pulse 88   Temp 97.7 F (36.5 C)   Resp 16   Ht 5' 10.98"  (1.803 m)   Wt 164 lb 3.2 oz (74.5 kg)   SpO2 96%   BMI 22.91 kg/m   Physical Exam Constitutional:      Appearance: He is normal weight.  HENT:     Head: Normocephalic and atraumatic.     Right Ear: Tympanic membrane, ear canal and external ear normal.     Left Ear: Tympanic membrane, ear canal and external ear normal.     Nose: Nose normal.     Mouth/Throat:     Mouth: Mucous membranes are moist.     Pharynx: Oropharynx is clear.  Eyes:     Extraocular Movements: Extraocular movements intact.     Conjunctiva/sclera: Conjunctivae normal.     Pupils: Pupils are equal, round, and reactive to light.  Cardiovascular:     Rate and Rhythm: Normal rate and regular rhythm.     Pulses: Normal pulses.     Heart sounds: Normal heart sounds.  Pulmonary:     Effort: Pulmonary effort is normal.     Breath sounds: Normal breath sounds.  Abdominal:     General: Bowel sounds are normal.     Palpations: Abdomen is soft.  Genitourinary:    Comments: Patient declined examination. Musculoskeletal:        General: Normal range of motion.     Cervical back: Normal range of motion and neck supple.  Skin:    General: Skin is warm and dry.     Capillary Refill: Capillary refill takes less than 2 seconds.  Neurological:     General: No focal deficit present.     Mental Status: He is alert and oriented to person, place, and time.  Psychiatric:        Mood and Affect: Mood normal.        Behavior: Behavior normal.     ASSESSMENT AND PLAN: 1. Annual physical exam: - Counseled on 150 minutes of exercise per week as tolerated, healthy eating (including decreased daily intake of saturated fats, cholesterol, added sugars, sodium), STI prevention, and routine healthcare maintenance.  2. Screening for metabolic disorder: - CMP last obtained 01/22/2021.  3. Screening for deficiency anemia: - CBC to screen for anemia. - CBC  4. Encounter for screening for HIV: - HIV antibody to screen for human  immunodeficiency virus.  - HIV antibody (with reflex)  5. Type 1 diabetes mellitus with diabetic polyneuropathy [facility]): - Keep all appointments scheduled with Endocrinology.  - Microalbumin / creatinine urine ratio to check kidney function. - Microalbumin / creatinine urine ratio  6. Diabetic eye exam [facility]): - Patient reports he is up-to-date on eye exam. Seeing Dr. Dione Booze about every 4 months.   7. Need for Tdap vaccination: -  Administered during today's visit. - Tdap vaccine greater than or equal to 7yo IM  8. Need for shingles vaccine: - Administered during today's visit. - Varicella-zoster vaccine IM (Shingrix)  9. Chronic left-sided low back pain, unspecified whether sciatica present: - Patient reports not ready to begin pharmacological treatment as of present.  - Follow-up with primary provider as scheduled.   10. Testicle lump: - Referral to Urology for further evaluation and management. - Ambulatory referral to Urology  11. Full dentures: - Referral to Urgent Tooth for further evaluation and management. - Ambulatory referral to Dentistry  12. Elevated blood pressure reading in office without diagnosis of hypertension: - Blood pressure not at goal during today's visit. Patient asymptomatic without chest pressure, chest pain, palpitations, shortness of breath, and worst headache of life. - Follow-up in 2 weeks or sooner if needed with primary provider for blood pressure check. Write down your blood pressure readings each day and bring those results along with your home blood pressure monitor to your appointment Patient was given the opportunity to ask questions.  Patient verbalized understanding of the plan and was able to repeat key elements of the plan. Patient was given clear instructions to go to Emergency Department or return to medical center if symptoms don't improve, worsen, or new problems develop.The patient verbalized understanding.   Orders Placed This Encounter   Procedures  . Tdap vaccine greater than or equal to 7yo IM  . Varicella-zoster vaccine IM (Shingrix)  . HIV antibody (with reflex)  . Microalbumin / creatinine urine ratio  . CBC  . Ambulatory referral to Ophthalmology  . Ambulatory referral to Dentistry  . Ambulatory referral to Urology     Requested Prescriptions    No prescriptions requested or ordered in this encounter    Return in about 2 weeks (around 04/21/2021) for Follow-Up elevated blood pressure reading.  [name], NP

## 2021-04-07 ENCOUNTER — Ambulatory Visit (INDEPENDENT_AMBULATORY_CARE_PROVIDER_SITE_OTHER): Payer: Medicaid Other | Admitting: Family

## 2021-04-07 ENCOUNTER — Other Ambulatory Visit: Payer: Self-pay

## 2021-04-07 ENCOUNTER — Encounter: Payer: Self-pay | Admitting: Family

## 2021-04-07 VITALS — BP 161/51 | HR 88 | Temp 97.7°F | Resp 16 | Ht 70.98 in | Wt 164.2 lb

## 2021-04-07 DIAGNOSIS — N5089 Other specified disorders of the male genital organs: Secondary | ICD-10-CM

## 2021-04-07 DIAGNOSIS — Z0000 Encounter for general adult medical examination without abnormal findings: Secondary | ICD-10-CM

## 2021-04-07 DIAGNOSIS — E1042 Type 1 diabetes mellitus with diabetic polyneuropathy: Secondary | ICD-10-CM

## 2021-04-07 DIAGNOSIS — M545 Low back pain, unspecified: Secondary | ICD-10-CM

## 2021-04-07 DIAGNOSIS — Z114 Encounter for screening for human immunodeficiency virus [HIV]: Secondary | ICD-10-CM

## 2021-04-07 DIAGNOSIS — R030 Elevated blood-pressure reading, without diagnosis of hypertension: Secondary | ICD-10-CM | POA: Diagnosis not present

## 2021-04-07 DIAGNOSIS — Z23 Encounter for immunization: Secondary | ICD-10-CM

## 2021-04-07 DIAGNOSIS — Z13228 Encounter for screening for other metabolic disorders: Secondary | ICD-10-CM

## 2021-04-07 DIAGNOSIS — K08109 Complete loss of teeth, unspecified cause, unspecified class: Secondary | ICD-10-CM

## 2021-04-07 DIAGNOSIS — Z130 Encounter for screening for diseases of the blood and blood-forming organs and certain disorders involving the immune mechanism: Secondary | ICD-10-CM

## 2021-04-07 DIAGNOSIS — G8929 Other chronic pain: Secondary | ICD-10-CM

## 2021-04-07 DIAGNOSIS — Z13220 Encounter for screening for lipoid disorders: Secondary | ICD-10-CM

## 2021-04-07 DIAGNOSIS — Z1329 Encounter for screening for other suspected endocrine disorder: Secondary | ICD-10-CM

## 2021-04-07 DIAGNOSIS — E119 Type 2 diabetes mellitus without complications: Secondary | ICD-10-CM

## 2021-04-07 DIAGNOSIS — Z972 Presence of dental prosthetic device (complete) (partial): Secondary | ICD-10-CM

## 2021-04-07 NOTE — Progress Notes (Signed)
Physical Left side pain radiates to chest Wants Tetanus and Shingrix vaccine

## 2021-04-07 NOTE — Patient Instructions (Signed)
 Annual physical exam and labs today.   Preventive Care 59-59 Years Old, Male Preventive care refers to lifestyle choices and visits with your health care provider that can promote health and wellness. This includes:  A yearly physical exam. This is also called an annual wellness visit.  Regular dental and eye exams.  Immunizations.  Screening for certain conditions.  Healthy lifestyle choices, such as: ? Eating a healthy diet. ? Getting regular exercise. ? Not using drugs or products that contain nicotine and tobacco. ? Limiting alcohol use. What can I expect for my preventive care visit? Physical exam Your health care provider will check your:  Height and weight. These may be used to calculate your BMI (body mass index). BMI is a measurement that tells if you are at a healthy weight.  Heart rate and blood pressure.  Body temperature.  Skin for abnormal spots. Counseling Your health care provider may ask you questions about your:  Past medical problems.  Family's medical history.  Alcohol, tobacco, and drug use.  Emotional well-being.  Home life and relationship well-being.  Sexual activity.  Diet, exercise, and sleep habits.  Work and work environment.  Access to firearms. What immunizations do I need? Vaccines are usually given at various ages, according to a schedule. Your health care provider will recommend vaccines for you based on your age, medical history, and lifestyle or other factors, such as travel or where you work.   What tests do I need? Blood tests  Lipid and cholesterol levels. These may be checked every 5 years, or more often if you are over 59 years old.  Hepatitis C test.  Hepatitis B test. Screening  Lung cancer screening. You may have this screening every year starting at age 59 if you have a 30-pack-year history of smoking and currently smoke or have quit within the past 15 years.  Prostate cancer screening. Recommendations will  vary depending on your family history and other risks.  Genital exam to check for testicular cancer or hernias.  Colorectal cancer screening. ? All adults should have this screening starting at age 59 and continuing until age 75. ? Your health care provider may recommend screening at age 59 if you are at increased risk. ? You will have tests every 1-10 years, depending on your results and the type of screening test.  Diabetes screening. ? This is done by checking your blood sugar (glucose) after you have not eaten for a while (fasting). ? You may have this done every 1-3 years.  STD (sexually transmitted disease) testing, if you are at risk. Follow these instructions at home: Eating and drinking  Eat a diet that includes fresh fruits and vegetables, whole grains, lean protein, and low-fat dairy products.  Take vitamin and mineral supplements as recommended by your health care provider.  Do not drink alcohol if your health care provider tells you not to drink.  If you drink alcohol: ? Limit how much you have to 0-2 drinks a day. ? Be aware of how much alcohol is in your drink. In the [place] one drink equals one 12 oz bottle of beer (355 mL), one 5 oz glass of wine (148 mL), or one 1 oz glass of hard liquor (44 mL).   Lifestyle  Take daily care of your teeth and gums. Brush your teeth every morning and night with fluoride toothpaste. Floss one time each day.  Stay active. Exercise for at least 30 minutes 5 or more days each week.    Do not use any products that contain nicotine or tobacco, such as cigarettes, e-cigarettes, and chewing tobacco. If you need help quitting, ask your health care provider.  Do not use drugs.  If you are sexually active, practice safe sex. Use a condom or other form of protection to prevent STIs (sexually transmitted infections).  If told by your health care provider, take low-dose aspirin daily starting at age 50.  Find healthy ways to cope with stress,  such as: ? Meditation, yoga, or listening to music. ? Journaling. ? Talking to a trusted person. ? Spending time with friends and family. Safety  Always wear your seat belt while driving or riding in a vehicle.  Do not drive: ? If you have been drinking alcohol. Do not ride with someone who has been drinking. ? When you are tired or distracted. ? While texting.  Wear a helmet and other protective equipment during sports activities.  If you have firearms in your house, make sure you follow all gun safety procedures. What's next?  Go to your health care provider once a year for an annual wellness visit.  Ask your health care provider how often you should have your eyes and teeth checked.  Stay up to date on all vaccines. This information is not intended to replace advice given to you by your health care provider. Make sure you discuss any questions you have with your health care provider. Document Revised: 08/06/2019 Document Reviewed: 11/01/2018 Elsevier Patient Education  2021 Elsevier Inc.  

## 2021-04-08 LAB — CBC
Hematocrit: 43.2 % (ref 37.5–51.0)
Hemoglobin: 14.4 g/dL (ref 13.0–17.7)
MCH: 29.8 pg (ref 26.6–33.0)
MCHC: 33.3 g/dL (ref 31.5–35.7)
MCV: 89 fL (ref 79–97)
Platelets: 181 10*3/uL (ref 150–450)
RBC: 4.83 x10E6/uL (ref 4.14–5.80)
RDW: 11.8 % (ref 11.6–15.4)
WBC: 4.2 10*3/uL (ref 3.4–10.8)

## 2021-04-08 LAB — MICROALBUMIN / CREATININE URINE RATIO
Creatinine, Urine: 106.2 mg/dL
Microalb/Creat Ratio: 17 mg/g creat (ref 0–29)
Microalbumin, Urine: 18.2 ug/mL

## 2021-04-08 LAB — HIV ANTIBODY (ROUTINE TESTING W REFLEX): HIV Screen 4th Generation wRfx: NONREACTIVE

## 2021-04-08 NOTE — Progress Notes (Signed)
No anemia.   HIV negative.

## 2021-04-08 NOTE — Progress Notes (Signed)
Microalbumin/creatinine urine ratio normal.

## 2021-04-09 ENCOUNTER — Other Ambulatory Visit: Payer: Self-pay | Admitting: Endocrinology

## 2021-04-20 NOTE — Progress Notes (Signed)
Patient ID: Patrick [name], male    DOB: 1961/05/28  MRN: [id number]  CC: Blood Pressure Checkup  Subjective: Patrick [name] is a 60 y.o. male who presents for blood pressure checkup.   His concerns today include:  1. BLOOD PRESSURE CHECKUP: 04/07/2021: - Follow-up in 2 weeks or sooner if needed with primary provider for blood pressure check.   04/21/2021: Home Monitoring?: [x]  Yes    []  No Monitoring Frequency: []  Yes    [x]  No Home BP results range: []  Yes    [x]  No, reports blood pressure readings have been high similar to today's, sometimes has lower readings SOB? []  Yes    [x]  No Chest Pain?: []  Yes    [x]  No  2. ERECTILE DYSFUNCTION: Requesting medication. Reports unable to get an erection that he is satisfied with.   Patient Active Problem List   Diagnosis Date Noted  . Essential hypertension 04/21/2021  . Type 1 diabetes mellitus (HCC) 03/22/2014  . Cirrhosis of liver (HCC) 03/14/2014  . Colon cancer screening 03/14/2014  . Hypotension, unspecified 03/01/2014  . DKA (diabetic ketoacidoses) 02/28/2014  . Hyponatremia 02/28/2014  . Hepatic encephalopathy (HCC) 08/24/2012  . Anemia 08/23/2012  . Malnutrition (HCC) 08/23/2012  . Acute renal failure (HCC) 08/23/2012  . Thrombocytopenia (HCC) 08/23/2012  . Coagulopathy (HCC) 08/23/2012  . Ascites 08/22/2012  . 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     Current Outpatient Medications on File Prior to Visit  Medication Sig Dispense Refill  . ACCU-CHEK AVIVA PLUS test strip 1 EACH BY OTHER ROUTE 4 (FOUR) TIMES DAILY. AND LANCETS 4/DAY 100 strip 13  . B-D UF III MINI PEN NEEDLES 31G X 5 MM MISC USE 3 TIMES DAILY. 300 each 0  . insulin aspart (NOVOLOG FLEXPEN) 100 UNIT/ML FlexPen Inject 5 Units into the skin 3 (three) times daily with meals. 15 mL 11  . insulin glargine (LANTUS SOLOSTAR) 100 UNIT/ML Solostar Pen Inject 2 Units into the skin at bedtime. 15 mL PRN  . latanoprost (XALATAN) 0.005 % ophthalmic  solution SMARTSIG:1 Drop(s) In Eye(s) Every Evening    . Multiple Vitamin (MULTIVITAMIN WITH MINERALS) TABS tablet Take 1 tablet by mouth daily.     No current facility-administered medications on file prior to visit.    No Known Allergies  Social History   Socioeconomic History  . Marital status: Single    Spouse name: Not on file  . Number of children: 1  . Years of education: Not on file  . Highest education level: Not on file  Occupational History  . Occupation: unemployed  Tobacco Use  . Smoking status: Current Every Day Smoker    Packs/day: 0.50    Years: 15.00    Pack years: 7.50    Types: Cigarettes  . Smokeless tobacco: Never Used  . Tobacco comment: tobacco info given 03/14/14  Substance and Sexual Activity  . Alcohol use: No  . Drug use: No  . Sexual activity: Not on file  Other Topics Concern  . Not on file  Social History Narrative  . Not on file   Social Determinants of Health   Financial Resource Strain: Not on file  Food Insecurity: Not on file  Transportation Needs: Not on file  Physical Activity: Not on file  Stress: Not on file  Social Connections: Not on file  Intimate Partner Violence: Not on file    Family History  Problem Relation Age of Onset  . Heart disease Mother  bi-pass  . Hypertension Mother   . Cancer Brother        unsure type  . Hypertension Father   . Colon cancer Neg Hx     Past Surgical History:  Procedure Laterality Date  . EYE SURGERY N/A    [employer] 02/21/2021  . MOUTH SURGERY     on gums  . TONSILLECTOMY      ROS: Review of Systems Negative except as stated above  PHYSICAL EXAM: BP (!) 150/71 (BP Location: Right Arm, Patient Position: Sitting, Cuff Size: Normal)   Pulse 98   Temp 98.2 F (36.8 C)   Resp 15   Ht 5' 10.98" (1.803 m)   Wt 166 lb 12.8 oz (75.7 kg)   SpO2 98%   BMI 23.27 kg/m   Physical Exam HENT:     Head: Normocephalic and atraumatic.  Eyes:     Extraocular Movements:  Extraocular movements intact.     Conjunctiva/sclera: Conjunctivae normal.     Pupils: Pupils are equal, round, and reactive to light.  Cardiovascular:     Rate and Rhythm: Normal rate and regular rhythm.     Pulses: Normal pulses.     Heart sounds: Normal heart sounds.  Pulmonary:     Effort: Pulmonary effort is normal.     Breath sounds: Normal breath sounds.  Musculoskeletal:     Cervical back: Normal range of motion and neck supple.  Neurological:     General: No focal deficit present.     Mental Status: He is alert and oriented to person, place, and time.  Psychiatric:        Mood and Affect: Mood normal.        Behavior: Behavior normal.     ASSESSMENT AND PLAN: 1. Essential hypertension: - Newly diagnosed hypertension during today's visit. - Blood pressure not at goal during today's visit. Patient asymptomatic without chest pressure, chest pain, palpitations, shortness of breath, and worst headache of life. - Begin Losartan-Hydrochlorothiazide as prescribed.  - Counseled on blood pressure goal of less than 130/80, low-sodium, DASH diet, medication compliance, 150 minutes of moderate intensity exercise per week as tolerated. Discussed medication compliance, adverse effects. - Follow-up with primary provider in 2 weeks or sooner if needed.  - losartan-hydrochlorothiazide (HYZAAR) 50-12.5 MG tablet; Take 1 tablet by mouth daily.  Dispense: 30 tablet; Refill: 0  2. Erectile dysfunction, unspecified erectile dysfunction type: - Begin Sildenafil as prescribed.  - Counseled against using this with nitrates and alcohol consumption. Patient verbalized understanding.  - Follow-up with primary provider as scheduled.  - sildenafil (VIAGRA) 25 MG tablet; Take 1 tablet 1/2 hour to 1 hour prior to intercourse as needed. Limit use to 1/2 tablet or 1 tablet per 24 hours.  Dispense: 20 tablet; Refill: 0    Patient was given the opportunity to ask questions.  Patient verbalized  understanding of the plan and was able to repeat key elements of the plan. Patient was given clear instructions to go to Emergency Department or return to medical center if symptoms don't improve, worsen, or new problems develop.The patient verbalized understanding.   No orders of the defined types were placed in this encounter.   Requested Prescriptions   Signed Prescriptions Disp Refills  . losartan-hydrochlorothiazide (HYZAAR) 50-12.5 MG tablet 30 tablet 0    Sig: Take 1 tablet by mouth daily.  . sildenafil (VIAGRA) 25 MG tablet 20 tablet 0    Sig: Take 1 tablet 1/2 hour to 1 hour prior to intercourse  as needed. Limit use to 1/2 tablet or 1 tablet per 24 hours.    Return in about 2 weeks (around 05/05/2021) for  Telephone Visit 2 weeks hypertension .  [name], NP

## 2021-04-21 ENCOUNTER — Other Ambulatory Visit: Payer: Self-pay

## 2021-04-21 ENCOUNTER — Ambulatory Visit (INDEPENDENT_AMBULATORY_CARE_PROVIDER_SITE_OTHER): Payer: Medicaid Other | Admitting: Family

## 2021-04-21 VITALS — BP 150/71 | HR 98 | Temp 98.2°F | Resp 15 | Ht 70.98 in | Wt 166.8 lb

## 2021-04-21 DIAGNOSIS — I10 Essential (primary) hypertension: Secondary | ICD-10-CM | POA: Diagnosis not present

## 2021-04-21 DIAGNOSIS — N529 Male erectile dysfunction, unspecified: Secondary | ICD-10-CM

## 2021-04-21 MED ORDER — SILDENAFIL CITRATE 25 MG PO TABS: ORAL_TABLET | ORAL | 0 refills | Status: DC

## 2021-04-21 MED ORDER — LOSARTAN POTASSIUM-HCTZ 50-12.5 MG PO TABS: 1.0000 | ORAL_TABLET | Freq: Every day | ORAL | 0 refills | Status: DC

## 2021-04-21 NOTE — Progress Notes (Signed)
HTN

## 2021-05-04 NOTE — Progress Notes (Signed)
Virtual Visit via Telephone Note  I connected with Patrick [name], on 05/05/2021 at 3:19 PM by telephone due to the COVID-19 pandemic and verified that I am speaking with the correct person using two identifiers.  Due to current restrictions/limitations of in-office visits due to the COVID-19 pandemic, this scheduled clinical appointment was converted to a telehealth visit.   Consent: I discussed the limitations, risks, security and privacy concerns of performing an evaluation and management service by telephone and the availability of in person appointments. I also discussed with the patient that there may be a patient responsible charge related to this service. The patient expressed understanding and agreed to proceed.   Location of Patient: Home  Location of Provider: [facility]   Persons participating in Telemedicine visit: Patrick B Robina Ade, NP Patrick [name], CMA   History of Present Illness: Patrick [name] is a 60 year-old male who presents for hypertension follow-up.   HYPERTENSION FOLLOW-UP: 04/21/2021: - Newly diagnosed hypertension during today's visit. - Begin Losartan-Hydrochlorothiazide as prescribed. - Follow-up with primary provider in 2 weeks or sooner if needed.   05/05/2021: Home blood pressures 130's-140's/90's. Denies shortness of breath and chest pain.   2. ANXIETY: Reports he was in the [employer] 10 years ago. Recently having more anxiety and stress. Denies thoughts of self-harm, suicidal ideations, and homicidal ideations. Would like to try medication. Has not had alcohol in over 10 years.  Past Medical History:  Diagnosis Date   Cirrhosis (HCC)    Diabetes (HCC)    type 1   Diabetes mellitus without complication (HCC)    Phreesia 02/21/2021   ETOH abuse    No Known Allergies  Current Outpatient Medications on File Prior to Visit  Medication Sig Dispense Refill   ACCU-CHEK AVIVA PLUS test strip 1 EACH BY  OTHER ROUTE 4 (FOUR) TIMES DAILY. AND LANCETS 4/DAY 100 strip 13   B-D UF III MINI PEN NEEDLES 31G X 5 MM MISC USE 3 TIMES DAILY. 300 each 0   insulin aspart (NOVOLOG FLEXPEN) 100 UNIT/ML FlexPen Inject 5 Units into the skin 3 (three) times daily with meals. 15 mL 11   insulin glargine (LANTUS SOLOSTAR) 100 UNIT/ML Solostar Pen Inject 2 Units into the skin at bedtime. 15 mL PRN   latanoprost (XALATAN) 0.005 % ophthalmic solution SMARTSIG:1 Drop(s) In Eye(s) Every Evening     losartan-hydrochlorothiazide (HYZAAR) 50-12.5 MG tablet Take 1 tablet by mouth daily. 30 tablet 0   Multiple Vitamin (MULTIVITAMIN WITH MINERALS) TABS tablet Take 1 tablet by mouth daily.     sildenafil (VIAGRA) 25 MG tablet Take 1 tablet 1/2 hour to 1 hour prior to intercourse as needed. Limit use to 1/2 tablet or 1 tablet per 24 hours. 20 tablet 0   No current facility-administered medications on file prior to visit.    Observations/Objective: Alert and oriented x 3. Not in acute distress. Physical examination not completed as this is a telemedicine visit.  Assessment and Plan: 1. Essential hypertension: - Home blood pressures not at goal.  - Increase Losartan-Hydrochlorothiazide from 50-12.5 mg to 100-25 mg.  - Counseled on blood pressure goal of less than 130/80, low-sodium, DASH diet, medication compliance, 150 minutes of moderate intensity exercise per week as tolerated. Discussed medication compliance, adverse effects. - Follow-up with primary provider in 2 weeks or sooner if needed.  - losartan-hydrochlorothiazide (HYZAAR) 100-25 MG tablet; Take 1 tablet by mouth daily.  Dispense: 30 tablet; Refill: 0  2.  Generalized anxiety disorder: - Stable. Patient denies thoughts of self-harm, suicidal ideations, and homicidal ideations.  - Begin Sertraline as prescribed.  Avoid driving or hazardous activity until you know how this medication will affect you. Your reactions could be impaired. Dizziness or fainting can cause  falls, accidents, or severe injuries. Common side effects include drowsiness, nausea, constipation, loss of appetite, dry mouth, increased sweating. Call your provider if you have pounding heartbeats or fluttering in your chest, a light-headed feeling like you may pass out, easy bruising/unusal bleeding, vision change, difficult or painful urination, impotence/sexual problems, liver problems (right-sided upper stomach pain, itching, dark urine, yellowing of skin or eyes/jaundice, low levels of sodium in the body (headache, confusion, slurred speech, severe weakness, vomiting, loss of coordination, feeling unsteady), or manic episodes (racing thoughts, increased energy, decreased need for sleep, risk-taking behavior, being agitated, talkative) Seek medical attention immediately if you have symptoms of serotonin syndrome such as agitation, hallucinations, fever, sweating, shivering, fast heart rate, muscle stiffness, twitching, loss of coordination, nausea, vomiting, or diarrhea Report any new or worsening symptoms to your provider, such as but not limited to: mood or behavior changes, anxiety, panic attacks, trouble sleeping, or if you feel impulsive, irritable, agitated, hostile, aggressive, restless, hyperactive (mentally or physically), more depressed, or have thoughts about suicide or hurting yourself - Follow-up with primary provider in 4 weeks or sooner if needed.  - sertraline (ZOLOFT) 25 MG tablet; Take 1 tablet (25 mg total) by mouth daily.  Dispense: 30 tablet; Refill: 0   Follow Up Instructions: Follow-up with primary provider in 2 weeks for hypertension and 4 weeks for anxiety.   Patient was given clear instructions to go to Emergency Department or return to medical center if symptoms don't improve, worsen, or new problems develop.The patient verbalized understanding.  I discussed the assessment and treatment plan with the patient. The patient was provided an opportunity to ask questions and  all were answered. The patient agreed with the plan and demonstrated an understanding of the instructions.   The patient was advised to call back or seek an in-person evaluation if the symptoms worsen or if the condition fails to improve as anticipated.   I provided 20 minutes total of non-face-to-face time during this encounter.   [name], NP  [facility] [place], [place] [phone] 05/05/2021, 3:19 PM

## 2021-05-05 ENCOUNTER — Other Ambulatory Visit: Payer: Self-pay

## 2021-05-05 ENCOUNTER — Telehealth (INDEPENDENT_AMBULATORY_CARE_PROVIDER_SITE_OTHER): Payer: Medicaid Other | Admitting: Family

## 2021-05-05 VITALS — BP 139/96

## 2021-05-05 DIAGNOSIS — I10 Essential (primary) hypertension: Secondary | ICD-10-CM | POA: Diagnosis not present

## 2021-05-05 DIAGNOSIS — F411 Generalized anxiety disorder: Secondary | ICD-10-CM | POA: Diagnosis not present

## 2021-05-05 MED ORDER — LOSARTAN POTASSIUM-HCTZ 100-25 MG PO TABS: 1.0000 | ORAL_TABLET | Freq: Every day | ORAL | 0 refills | Status: DC

## 2021-05-05 MED ORDER — SERTRALINE HCL 25 MG PO TABS: 25.0000 mg | ORAL_TABLET | Freq: Every day | ORAL | 0 refills | Status: DC

## 2021-05-05 NOTE — Progress Notes (Signed)
Pt presents for virtual telemedicine visit to discuss BP  Pt stated that home readings have been 128/98, 124/90, 137/98

## 2021-05-13 ENCOUNTER — Other Ambulatory Visit: Payer: Self-pay | Admitting: Family

## 2021-05-13 DIAGNOSIS — I10 Essential (primary) hypertension: Secondary | ICD-10-CM

## 2021-05-27 ENCOUNTER — Other Ambulatory Visit: Payer: Self-pay | Admitting: Family

## 2021-05-27 ENCOUNTER — Other Ambulatory Visit: Payer: Self-pay

## 2021-05-27 ENCOUNTER — Ambulatory Visit (INDEPENDENT_AMBULATORY_CARE_PROVIDER_SITE_OTHER): Payer: Medicaid Other | Admitting: Endocrinology

## 2021-05-27 VITALS — BP 110/72 | HR 105 | Ht 70.0 in | Wt 167.8 lb

## 2021-05-27 DIAGNOSIS — E1042 Type 1 diabetes mellitus with diabetic polyneuropathy: Secondary | ICD-10-CM | POA: Diagnosis not present

## 2021-05-27 DIAGNOSIS — I10 Essential (primary) hypertension: Secondary | ICD-10-CM

## 2021-05-27 LAB — POCT GLYCOSYLATED HEMOGLOBIN (HGB A1C): Hemoglobin A1C: 7.9 % — AB (ref 4.0–5.6)

## 2021-05-27 MED ORDER — LANTUS SOLOSTAR 100 UNIT/ML ~~LOC~~ SOPN: 4.0000 [IU] | PEN_INJECTOR | Freq: Every day | SUBCUTANEOUS | 99 refills | Status: DC

## 2021-05-27 NOTE — Patient Instructions (Addendum)
check your blood sugar 4 times a day.  vary the time of day when you check, between before the 3 meals, and at bedtime.  also check if you have symptoms of your blood sugar being too high or too low.  please keep a record of the readings and bring it to your next appointment here (or you can bring the meter itself).  You can write it on any piece of paper.  please call us sooner if your blood sugar goes below 70, or if you have a lot of readings over 200.   I have sent a prescription to your pharmacy, to increase the Lantus to 4 units at bedtime Please continue the same Novolog. Please come back for a follow-up appointment in 3 months.

## 2021-05-27 NOTE — Progress Notes (Signed)
Subjective:    Patient ID: Patrick [name], male    DOB: [birth year], 60 y.o.   MRN: [id number]  HPI Pt returns for f/u of diabetes mellitus:   DM type: 1, vs chronic pancreatitis Dx'ed: 2015.  Complications: PPN and stage 3 CRI Therapy: insulin since 2020 DKA: only once, at time of dx.  Severe hypoglycemia: never.  Pancreatitis: never.  Pancreatic imaging (2007): pancreatic Ca++ Other: in 2017, he went into partial remission; he was off insulin 2018-2020; he stopped drinking EtOH in 2010; he takes multiple daily injections.     Interval history: no cbg record, but states cbg's vary from 80-210.  There is no trend throughout the day, but is it lowest in the afternoon.  He says eating at HS causes fasting hyperglycemia.  He takes insulin as rx'ed.   Past Medical History:  Diagnosis Date   Cirrhosis (HCC)    Diabetes (HCC)    type 1   Diabetes mellitus without complication (HCC)    Phreesia 02/21/2021   ETOH abuse     Past Surgical History:  Procedure Laterality Date   EYE SURGERY N/A    Phreesia 02/21/2021   MOUTH SURGERY     on gums   TONSILLECTOMY      Social History   Socioeconomic History   Marital status: Single    Spouse name: Not on file   Number of children: 1   Years of education: Not on file   Highest education level: Not on file  Occupational History   Occupation: unemployed  Tobacco Use   Smoking status: Every Day    Packs/day: 0.50    Years: 15.00    Pack years: 7.50    Types: Cigarettes   Smokeless tobacco: Never   Tobacco comments:    tobacco info given 03/14/14  Substance and Sexual Activity   Alcohol use: No   Drug use: No   Sexual activity: Not on file  Other Topics Concern   Not on file  Social History Narrative   Not on file   Social Determinants of Health   Financial Resource Strain: Not on file  Food Insecurity: Not on file  Transportation Needs: Not on file  Physical Activity: Not on file  Stress: Not on file  Social  Connections: Not on file  Intimate Partner Violence: Not on file    Current Outpatient Medications on File Prior to Visit  Medication Sig Dispense Refill   ACCU-CHEK AVIVA PLUS test strip 1 EACH BY OTHER ROUTE 4 (FOUR) TIMES DAILY. AND LANCETS 4/DAY 100 strip 13   B-D UF III MINI PEN NEEDLES 31G X 5 MM MISC USE 3 TIMES DAILY. 300 each 0   insulin aspart (NOVOLOG FLEXPEN) 100 UNIT/ML FlexPen Inject 5 Units into the skin 3 (three) times daily with meals. 15 mL 11   latanoprost (XALATAN) 0.005 % ophthalmic solution SMARTSIG:1 Drop(s) In Eye(s) Every Evening     losartan-hydrochlorothiazide (HYZAAR) 100-25 MG tablet Take 1 tablet by mouth daily. 30 tablet 0   Multiple Vitamin (MULTIVITAMIN WITH MINERALS) TABS tablet Take 1 tablet by mouth daily.     sertraline (ZOLOFT) 25 MG tablet Take 1 tablet (25 mg total) by mouth daily. 30 tablet 0   sildenafil (VIAGRA) 25 MG tablet Take 1 tablet 1/2 hour to 1 hour prior to intercourse as needed. Limit use to 1/2 tablet or 1 tablet per 24 hours. 20 tablet 0   No current facility-administered medications on file prior to visit.  No Known Allergies  Family History  Problem Relation Age of Onset   Heart disease Mother        bi-pass   Hypertension Mother    Cancer Brother        unsure type   Hypertension Father    Colon cancer Neg Hx     BP 110/72 (BP Location: Right Arm, Patient Position: Sitting, Cuff Size: Normal)   Pulse (!) 105   Ht 5\' 10"  (1.778 m)   Wt 167 lb 12.8 oz (76.1 kg)   SpO2 97%   BMI 24.08 kg/m    Review of Systems He denies hypoglycemia.      Objective:   Physical Exam Pulses: dorsalis pedis intact bilat.   MSK: no deformity of the feet CV: no leg edema Skin:  no ulcer on the feet.  normal color and temp on the feet. Neuro: sensation is intact to touch on the feet.    Lab Results  Component Value Date   HGBA1C 7.9 (A) 05/27/2021       Assessment & Plan:  DM: uncontrolled  Patient Instructions  check your  blood sugar 4 times a day.  vary the time of day when you check, between before the 3 meals, and at bedtime.  also check if you have symptoms of your blood sugar being too high or too low.  please keep a record of the readings and bring it to your next appointment here (or you can bring the meter itself).  You can write it on any piece of paper.  please call 07/28/2021 sooner if your blood sugar goes below 70, or if you have a lot of readings over 200.   I have sent a prescription to your pharmacy, to increase the Lantus to 4 units at bedtime Please continue the same Novolog. Please come back for a follow-up appointment in 3 months.

## 2021-06-01 NOTE — Progress Notes (Signed)
Patient ID: Patrick [name], male    DOB: 24-Jan-1961  MRN: [id number]  CC: Hypertension Follow-Up   Subjective: Patrick [name] is a 60 y.o. male who presents for hypertension follow-up.   His concerns today include:   1. HYPERTENSION FOLLOW-UP: 05/05/2021: - Home blood pressures not at goal. - Increase Losartan-Hydrochlorothiazide from 50-12.5 mg to 100-25 mg. - Follow-up with primary provider in 2 weeks or sooner if needed.   06/04/2021: Doing well on current regimen. No side effects. No issues/concerns. Denies chest pain and shortness of breath. Home blood pressures are 110's/70's.  2. ANXIETY FOLLOW-UP: 05/05/2021: - Begin Sertraline as prescribed.  - Follow-up with primary provider in 4 weeks or sooner if needed.   06/02/2021: Reports he has not taken medication because of side effect of eye burning with glaucoma eye drops. Reports he is feeling better since last visit and doesn't feel the need for medication presently. Reports dealing with a situation at previous visit and has since resolved.   Depression screen [facility] 2/9 06/02/2021 05/05/2021 04/21/2021 04/07/2021 02/24/2021  Decreased Interest 0 0 0 0 0  Down, Depressed, Hopeless 0 0 0 0 0  PHQ - 2 Score 0 0 0 0 0  Altered sleeping 0 0 0 0 0  Tired, decreased energy 0 0 0 0 0  Change in appetite 0 0 0 0 0  Feeling bad or failure about yourself  0 0 0 0 0  Trouble concentrating 0 0 0 0 0  Moving slowly or fidgety/restless 0 0 0 0 0  Suicidal thoughts 0 0 0 - 0  PHQ-9 Score 0 0 0 0 0  Difficult doing work/chores Not difficult at all Not difficult at all Not difficult at all Not difficult at all Not difficult at all     Patient Active Problem List   Diagnosis Date Noted   Generalized anxiety disorder 05/05/2021   Essential hypertension 04/21/2021   Type 1 diabetes mellitus (HCC) 03/22/2014   Cirrhosis of liver (HCC) 03/14/2014   Colon cancer screening 03/14/2014   Hypotension, unspecified 03/01/2014   DKA (diabetic  ketoacidoses) 02/28/2014   Hyponatremia 02/28/2014   Hepatic encephalopathy (HCC) 08/24/2012   Anemia 08/23/2012   Malnutrition (HCC) 08/23/2012   Acute renal failure (HCC) 08/23/2012   Thrombocytopenia (HCC) 08/23/2012   Coagulopathy (HCC) 08/23/2012   Ascites 08/22/2012   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     Current Outpatient Medications on File Prior to Visit  Medication Sig Dispense Refill   ACCU-CHEK AVIVA PLUS test strip 1 EACH BY OTHER ROUTE 4 (FOUR) TIMES DAILY. AND LANCETS 4/DAY 100 strip 13   B-D UF III MINI PEN NEEDLES 31G X 5 MM MISC USE 3 TIMES DAILY. 300 each 0   insulin aspart (NOVOLOG FLEXPEN) 100 UNIT/ML FlexPen Inject 5 Units into the skin 3 (three) times daily with meals. 15 mL 11   insulin glargine (LANTUS SOLOSTAR) 100 UNIT/ML Solostar Pen Inject 4 Units into the skin at bedtime. 15 mL PRN   latanoprost (XALATAN) 0.005 % ophthalmic solution SMARTSIG:1 Drop(s) In Eye(s) Every Evening     Multiple Vitamin (MULTIVITAMIN WITH MINERALS) TABS tablet Take 1 tablet by mouth daily.     sildenafil (VIAGRA) 25 MG tablet Take 1 tablet 1/2 hour to 1 hour prior to intercourse as needed. Limit use to 1/2 tablet or 1 tablet per 24 hours. 20 tablet 0   No current facility-administered medications on file prior to visit.    No Known Allergies  Social History   Socioeconomic History   Marital status: Single    Spouse name: Not on file   Number of children: 1   Years of education: Not on file   Highest education level: Not on file  Occupational History   Occupation: unemployed  Tobacco Use   Smoking status: Every Day    Packs/day: 0.50    Years: 15.00    Pack years: 7.50    Types: Cigarettes   Smokeless tobacco: Never   Tobacco comments:    tobacco info given 03/14/14  Substance and Sexual Activity   Alcohol use: No   Drug use: No   Sexual activity: Not on file  Other Topics Concern   Not on file  Social History Narrative   Not on file    Social Determinants of Health   Financial Resource Strain: Not on file  Food Insecurity: Not on file  Transportation Needs: Not on file  Physical Activity: Not on file  Stress: Not on file  Social Connections: Not on file  Intimate Partner Violence: Not on file    Family History  Problem Relation Age of Onset   Heart disease Mother        bi-pass   Hypertension Mother    Cancer Brother        unsure type   Hypertension Father    Colon cancer Neg Hx     Past Surgical History:  Procedure Laterality Date   EYE SURGERY N/A    [employer]reesia 02/21/2021   MOUTH SURGERY     on gums   TONSILLECTOMY      ROS: Review of Systems Negative except as stated above  PHYSICAL EXAM: BP 121/83 (BP Location: Left Arm, Patient Position: Sitting, Cuff Size: Normal)   Pulse 89   Temp 97.9 F (36.6 C)   Resp 15   Ht 5' 10.98" (1.803 m)   Wt 167 lb 3.2 oz (75.8 kg)   SpO2 98%   BMI 23.33 kg/m   Physical Exam HENT:     Head: Normocephalic and atraumatic.  Eyes:     Extraocular Movements: Extraocular movements intact.     Conjunctiva/sclera: Conjunctivae normal.     Pupils: Pupils are equal, round, and reactive to light.  Cardiovascular:     Rate and Rhythm: Normal rate and regular rhythm.  Pulmonary:     Effort: Pulmonary effort is normal.     Breath sounds: Normal breath sounds.  Musculoskeletal:     Cervical back: Normal range of motion and neck supple.  Neurological:     General: No focal deficit present.     Mental Status: He is alert and oriented to person, place, and time.  Psychiatric:        Mood and Affect: Mood normal.        Behavior: Behavior normal.     ASSESSMENT AND PLAN: 1. Essential hypertension: - Blood pressure at goal during today's visit. Home blood pressures at goal.  - Continue Losartan-Hydrochlorothiazide as prescribed.  - Counseled on blood pressure goal of less than 130/80, low-sodium, DASH diet, medication compliance, 150 minutes of moderate  intensity exercise per week as tolerated. Discussed medication compliance, adverse effects. - BMP to evaluate kidney function and electrolyte balance. - Follow-up with primary provider in 3 months or sooner if needed.  - Basic Metabolic Panel - losartan-hydrochlorothiazide (HYZAAR) 100-25 MG tablet; Take 1 tablet by mouth daily.  Dispense: 90 tablet; Refill: 0  2. Generalized anxiety disorder: - Denies thoughts of self-harm,  suicidal ideations, and homicidal ideations. - Per patient preference Sertraline discontinued related to side effect of eyes burning with glaucoma eye drops. Declined further medication management as of present.  - Follow-up with primary provider as scheduled.  3. Prostate cancer screening: - Prostate screening by PSA today in office.  - PSA   Patient was given the opportunity to ask questions.  Patient verbalized understanding of the plan and was able to repeat key elements of the plan. Patient was given clear instructions to go to Emergency Department or return to medical center if symptoms don't improve, worsen, or new problems develop.The patient verbalized understanding.   Orders Placed This Encounter  Procedures   PSA   Basic Metabolic Panel     Requested Prescriptions   Signed Prescriptions Disp Refills   losartan-hydrochlorothiazide (HYZAAR) 100-25 MG tablet 90 tablet 0    Sig: Take 1 tablet by mouth daily.    Return in about 3 months (around 09/02/2021) for Follow-Up hypertension .  [name], NP

## 2021-06-02 ENCOUNTER — Other Ambulatory Visit: Payer: Self-pay

## 2021-06-02 ENCOUNTER — Ambulatory Visit (INDEPENDENT_AMBULATORY_CARE_PROVIDER_SITE_OTHER): Payer: Medicaid Other | Admitting: Family

## 2021-06-02 ENCOUNTER — Encounter: Payer: Self-pay | Admitting: Family

## 2021-06-02 VITALS — BP 121/83 | HR 89 | Temp 97.9°F | Resp 15 | Ht 70.98 in | Wt 167.2 lb

## 2021-06-02 DIAGNOSIS — Z125 Encounter for screening for malignant neoplasm of prostate: Secondary | ICD-10-CM | POA: Diagnosis not present

## 2021-06-02 DIAGNOSIS — Z23 Encounter for immunization: Secondary | ICD-10-CM

## 2021-06-02 DIAGNOSIS — F411 Generalized anxiety disorder: Secondary | ICD-10-CM

## 2021-06-02 DIAGNOSIS — I10 Essential (primary) hypertension: Secondary | ICD-10-CM | POA: Diagnosis not present

## 2021-06-02 MED ORDER — LOSARTAN POTASSIUM-HCTZ 100-25 MG PO TABS: 1.0000 | ORAL_TABLET | Freq: Every day | ORAL | 0 refills | Status: DC

## 2021-06-02 NOTE — Patient Instructions (Signed)
Managing Your Hypertension Hypertension, also called high blood pressure, is when the force of the blood pressing against the walls of the arteries is too strong. Arteries are blood vessels that carry blood from your heart throughout your body. Hypertension forces the heart to work harder to pump blood and may cause the arteries tobecome narrow or stiff. Understanding blood pressure readings Your personal target blood pressure may vary depending on your medical conditions, your age, and other factors. A blood pressure reading includes a higher number over a lower number. Ideally, your blood pressure should be below 120/80. You should know that: The first, or top, number is called the systolic pressure. It is a measure of the pressure in your arteries as your heart beats. The second, or bottom number, is called the diastolic pressure. It is a measure of the pressure in your arteries as the heart relaxes. Blood pressure is classified into four stages. Based on your blood pressure reading, your health care provider may use the following stages to determine what type of treatment you need, if any. Systolic pressure and diastolicpressure are measured in a unit called mmHg. Normal Systolic pressure: below 120. Diastolic pressure: below 80. Elevated Systolic pressure: 120-129. Diastolic pressure: below 80. Hypertension stage 1 Systolic pressure: 130-139. Diastolic pressure: 80-89. Hypertension stage 2 Systolic pressure: 140 or above. Diastolic pressure: 90 or above. How can this condition affect me? Managing your hypertension is an important responsibility. Over time, hypertension can damage the arteries and decrease blood flow to important parts of the body, including the brain, heart, and kidneys. Having untreated or uncontrolled hypertension can lead to: A heart attack. A stroke. A weakened blood vessel (aneurysm). Heart failure. Kidney damage. Eye damage. Metabolic syndrome. Memory and  concentration problems. Vascular dementia. What actions can I take to manage this condition? Hypertension can be managed by making lifestyle changes and possibly by taking medicines. Your health care provider will help you make a plan to bring yourblood pressure within a normal range. Nutrition  Eat a diet that is high in fiber and potassium, and low in salt (sodium), added sugar, and fat. An example eating plan is called the Dietary Approaches to Stop Hypertension (DASH) diet. To eat this way: Eat plenty of fresh fruits and vegetables. Try to fill one-half of your plate at each meal with fruits and vegetables. Eat whole grains, such as whole-wheat pasta, brown rice, or whole-grain bread. Fill about one-fourth of your plate with whole grains. Eat low-fat dairy products. Avoid fatty cuts of meat, processed or cured meats, and poultry with skin. Fill about one-fourth of your plate with lean proteins such as fish, chicken without skin, beans, eggs, and tofu. Avoid pre-made and processed foods. These tend to be higher in sodium, added sugar, and fat. Reduce your daily sodium intake. Most people with hypertension should eat less than 1,500 mg of sodium a day.  Lifestyle  Work with your health care provider to maintain a healthy body weight or to lose weight. Ask what an ideal weight is for you. Get at least 30 minutes of exercise that causes your heart to beat faster (aerobic exercise) most days of the week. Activities may include walking, swimming, or biking. Include exercise to strengthen your muscles (resistance exercise), such as weight lifting, as part of your weekly exercise routine. Try to do these types of exercises for 30 minutes at least 3 days a week. Do not use any products that contain nicotine or tobacco, such as cigarettes, e-cigarettes, and chewing   tobacco. If you need help quitting, ask your health care provider. Control any long-term (chronic) conditions you have, such as high  cholesterol or diabetes. Identify your sources of stress and find ways to manage stress. This may include meditation, deep breathing, or making time for fun activities.  Alcohol use Do not drink alcohol if: Your health care provider tells you not to drink. You are pregnant, may be pregnant, or are planning to become pregnant. If you drink alcohol: Limit how much you use to: 0-1 drink a day for women. 0-2 drinks a day for men. Be aware of how much alcohol is in your drink. In the [place] one drink equals one 12 oz bottle of beer (355 mL), one 5 oz glass of wine (148 mL), or one 1 oz glass of hard liquor (44 mL). Medicines Your health care provider may prescribe medicine if lifestyle changes are not enough to get your blood pressure under control and if: Your systolic blood pressure is 130 or higher. Your diastolic blood pressure is 80 or higher. Take medicines only as told by your health care provider. Follow the directions carefully. Blood pressure medicines must be taken as told by your health care provider. The medicine does not work as well when you skip doses. Skippingdoses also puts you at risk for problems. Monitoring Before you monitor your blood pressure: Do not smoke, drink caffeinated beverages, or exercise within 30 minutes before taking a measurement. Use the bathroom and empty your bladder (urinate). Sit quietly for at least 5 minutes before taking measurements. Monitor your blood pressure at home as told by your health care provider. To do this: Sit with your back straight and supported. Place your feet flat on the floor. Do not cross your legs. Support your arm on a flat surface, such as a table. Make sure your upper arm is at heart level. Each time you measure, take two or three readings one minute apart and record the results. You may also need to have your blood pressure checked regularly by your healthcare provider. General information Talk with your health care  provider about your diet, exercise habits, and other lifestyle factors that may be contributing to hypertension. Review all the medicines you take with your health care provider because there may be side effects or interactions. Keep all visits as told by your health care provider. Your health care provider can help you create and adjust your plan for managing your high blood pressure. Where to find more information National Heart, Lung, and Blood Institute: www.nhlbi.nih.gov American Heart Association: www.heart.org Contact a health care provider if: You think you are having a reaction to medicines you have taken. You have repeated (recurrent) headaches. You feel dizzy. You have swelling in your ankles. You have trouble with your vision. Get help right away if: You develop a severe headache or confusion. You have unusual weakness or numbness, or you feel faint. You have severe pain in your chest or abdomen. You vomit repeatedly. You have trouble breathing. These symptoms may represent a serious problem that is an emergency. Do not wait to see if the symptoms will go away. Get medical help right away. Call your local emergency services (911 in the [place] Do not drive yourself to the hospital. Summary Hypertension is when the force of blood pumping through your arteries is too strong. If this condition is not controlled, it may put you at risk for serious complications. Your personal target blood pressure may vary depending on your medical conditions,   your age, and other factors. For most people, a normal blood pressure is less than 120/80. Hypertension is managed by lifestyle changes, medicines, or both. Lifestyle changes to help manage hypertension include losing weight, eating a healthy, low-sodium diet, exercising more, stopping smoking, and limiting alcohol. This information is not intended to replace advice given to you by your health care provider. Make sure you discuss any questions  you have with your healthcare provider. Document Revised: 12/13/2019 Document Reviewed: 10/08/2019 Elsevier Patient Education  2022 Elsevier Inc.  

## 2021-06-02 NOTE — Progress Notes (Signed)
Pt presents for anxiety f/u, pt states that he has not been taking Zoloft due to he has glaucoma and he doesn't feel it was mixing right  Needs refill on Losartan-hctz

## 2021-06-02 NOTE — Addendum Note (Signed)
Addended by: Margorie John on: 06/02/2021 10:21 AM   Modules accepted: Orders

## 2021-06-03 ENCOUNTER — Other Ambulatory Visit: Payer: Self-pay | Admitting: Family

## 2021-06-03 DIAGNOSIS — R972 Elevated prostate specific antigen [PSA]: Secondary | ICD-10-CM

## 2021-06-03 LAB — BASIC METABOLIC PANEL
BUN/Creatinine Ratio: 15 (ref 9–20)
BUN: 19 mg/dL (ref 6–24)
CO2: 21 mmol/L (ref 20–29)
Calcium: 9.5 mg/dL (ref 8.7–10.2)
Chloride: 98 mmol/L (ref 96–106)
Creatinine, Ser: 1.27 mg/dL (ref 0.76–1.27)
Glucose: 142 mg/dL — ABNORMAL HIGH (ref 65–99)
Potassium: 4.3 mmol/L (ref 3.5–5.2)
Sodium: 137 mmol/L (ref 134–144)
eGFR: 65 mL/min/{1.73_m2} (ref >59)

## 2021-06-03 LAB — PSA: Prostate Specific Ag, Serum: 13 ng/mL — ABNORMAL HIGH (ref 0.0–4.0)

## 2021-06-03 NOTE — Progress Notes (Signed)
Kidney function normal.   Prostate screening higher than normal. Referral to Urology for further evaluation and management. Their office should call patient within 2 weeks with appointment details.

## 2021-06-10 ENCOUNTER — Ambulatory Visit: Payer: Medicaid Other

## 2021-07-01 ENCOUNTER — Other Ambulatory Visit: Payer: Self-pay | Admitting: Endocrinology

## 2021-07-09 ENCOUNTER — Telehealth: Payer: Self-pay | Admitting: Family

## 2021-07-09 NOTE — Telephone Encounter (Signed)
..   Medicaid Managed Care   Unsuccessful Outreach Note  07/09/2021 Name: Patrick [name] MRN: [id number] DOB: September 11, 1961  Referred by: [name], NP Reason for referral : High Risk Managed Medicaid and Appointment (I called the patient today to offer him a phone visit with the [facility] Team. I left my name and number on his VM.)   An unsuccessful telephone outreach was attempted today. The patient was referred to the case management team for assistance with care management and care coordination.   Follow Up Plan: The care management team will reach out to the patient again over the next 7-14 days.   Weston Settle Care Guide, High Risk Medicaid Managed Care Embedded Care Coordination [facility]  [facility]

## 2021-07-14 ENCOUNTER — Other Ambulatory Visit: Payer: Self-pay

## 2021-07-14 NOTE — Patient Outreach (Signed)
Medicaid Managed Care   Nurse Care Manager Note  07/14/2021 Name:  Patrick [name] MRN:  [id number] DOB:  [birth year]  Patrick [name] is an 60 y.o. year old male who is a primary patient of [name], NP.  The [facility] Managed Care Coordination team was consulted for assistance with:    HTN DMI  Patrick [name] was given information about Medicaid Managed Care Coordination team services today. Patrick [name] Patient agreed to services and verbal consent obtained.  Engaged with patient by telephone for initial visit in response to provider referral for case management and/or care coordination services.   Assessments/Interventions:  Review of past medical history, allergies, medications, health status, including review of consultants reports, laboratory and other test data, was performed as part of comprehensive evaluation and provision of chronic care management services.  SDOH (Social Determinants of Health) assessments and interventions performed: SDOH Interventions    Flowsheet Row Most Recent Value  SDOH Interventions   SDOH Interventions for the Following Domains Physical Activity  Food Insecurity Interventions Intervention Not Indicated  Financial Strain Interventions Intervention Not Indicated  Housing Interventions Intervention Not Indicated  Physical Activity Interventions Other (Comments)  [Encourage patient to continue walking everyday]  Stress Interventions Other (Comment)  [Patient likes to listen to music and/or read/work on cars when stressed]  Social Connections Interventions Intervention Not Indicated  Transportation Interventions Intervention Not Indicated       Care Plan  No Known Allergies  Medications Reviewed Today     Reviewed by Inge Rise, RN (Case Manager) on 07/14/21 at 1456  Med List Status: <None>   Medication Order Taking? Sig Documenting Provider Last Dose Status Informant  ACCU-CHEK AVIVA PLUS test strip [id number] Yes 1 EACH BY OTHER  ROUTE 4 (FOUR) TIMES DAILY. AND LANCETS 4/DAY Renato Shin, MD Taking Active   B-D UF III MINI PEN NEEDLES 31G X 5 MM MISC [id number] Yes USE 3 TIMES DAILY Renato Shin, MD Taking Active   insulin aspart (NOVOLOG FLEXPEN) 100 UNIT/ML FlexPen [id number] Yes Inject 5 Units into the skin 3 (three) times daily with meals. Renato Shin, MD Taking Active   insulin glargine (LANTUS SOLOSTAR) 100 UNIT/ML Solostar Pen [id number] Yes Inject 4 Units into the skin at bedtime. Renato Shin, MD Taking Active   latanoprost [name]) 0.005 % ophthalmic solution [id number] Yes SMARTSIG:1 Drop(s) In Eye(s) Every Evening [provider] Taking Active   losartan-hydrochlorothiazide (HYZAAR) 100-25 MG tablet [id number] Yes Take 1 tablet by mouth daily. [name], NP Taking Active   Multiple Vitamin (MULTIVITAMIN WITH MINERALS) TABS tablet [id number] Yes Take 1 tablet by mouth daily. [provider] Taking Active Self  sildenafil (VIAGRA) 25 MG tablet [id number] No Take 1 tablet 1/2 hour to 1 hour prior to intercourse as needed. Limit use to 1/2 tablet or 1 tablet per 24 hours.  Patient not taking: Reported on 07/14/2021   [name], NP Not Taking Active             Patient Active Problem List   Diagnosis Date Noted   Generalized anxiety disorder 05/05/2021   Essential hypertension 04/21/2021   Type 1 diabetes mellitus ([place]) 03/22/2014   Cirrhosis of liver ([place]) 03/14/2014   Colon cancer screening 03/14/2014   Hypotension, unspecified 03/01/2014   DKA (diabetic ketoacidoses) 02/28/2014   Hyponatremia 02/28/2014   Hepatic encephalopathy ([place]) 08/24/2012   Anemia 08/23/2012   Malnutrition ([place]) 08/23/2012   Acute renal failure ([place]) 08/23/2012   Thrombocytopenia ([place]) 08/23/2012  Coagulopathy ([place]) 08/23/2012   Ascites 42/59/5638   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    Conditions to be addressed/monitored per PCP order:  HTN and DM I  Care Plan : RNCM Plan  of Care  Updates made by Inge Rise, RN since 07/14/2021 12:00 AM     Problem: Chronic Disease Management & Care Coordination Needs for DMI & HTN      Long-Range Goal: Development of Plan of Care for Chronic Disease Management & Care Coordination Needs   Start Date: 07/14/2021  Expected End Date: 11/11/2021  Priority: High  Note:   Current Barriers:  Knowledge Deficits related to plan of care for management of HTN and DM I Chronic Disease Management support and education needs related to HTN and DM I  RNCM Clinical Goal(s):  Patient will verbalize understanding of plan for management of HTN and DM I verbalize basic understanding of HTN and DM I disease process and self health management plan   take all medications exactly as prescribed and will call provider for medication related questions attend all scheduled medical appointments: Endocrinologist appointment on 09/09/2021 and PCP appointment on 09/01/2021  demonstrate ongoing adherence to prescribed treatment plan for HTN and DM I as evidenced by daily monitoring and recording of CBG  adherence to ADA/ carb modified diet exercise 5 days/week adherence to prescribed medication regimen contacting provider for new or worsened symptoms or questions   demonstrate ongoing health management independence   continue to work with RN Care Manager to address care management and care coordination needs related to HTN and DM I  work with community resource care guide to address needs related to   finding a dental group that works with [employer] patient to repair dentures  through collaboration with Consulting civil engineer, provider, and care team.   Interventions: Inter-disciplinary care team collaboration (see longitudinal plan of care) Evaluation of current treatment plan related to  self management and patient's adherence to plan as established by provider   Diabetes:  (Status: New goal.) Lab Results  Component Value Date   HGBA1C 7.9 (A)  05/27/2021  Assessed patient's understanding of A1c goal: <7% Provided education to patient about basic DM disease process; Reviewed medications with patient and discussed importance of medication adherence;        Counseled on importance of regular laboratory monitoring as prescribed;        Discussed plans with patient for ongoing care management follow up and provided patient with direct contact information for care management team;      Provided patient with verbal education related to hypo and hyperglycemia and importance of correct treatment;       Reviewed scheduled/upcoming provider appointments including: PCP appointment on 09/01/2021 and Endocrinologist on 09/09/2021;         Advised patient, providing education and rationale, to check cbg 3 times/day and record.  Patient has continuous blood sugar monitor Dexcom for last 2 - 3 months and likes it.      Referral made to community resources care guide team for assistance with finding dental group that works with medicaid patients to repair dentures;      Review of patient status, including review of consultants reports, relevant laboratory and other test results, and medications completed;       Assessed social determinant of health barriers;         Hypertension: (Status: New goal.) Last practice recorded BP readings:  BP Readings from Last 3 Encounters:  06/02/21 121/83  05/27/21 110/72  05/05/21 (!) 139/96  Most recent eGFR/CrCl:  Lab Results  Component Value Date   EGFR 65 06/02/2021    No components found for: CRCL  Evaluation of current treatment plan related to hypertension self management and patient's adherence to plan as established by provider;   Reviewed medications with patient and discussed importance of compliance;  Provided assistance with obtaining home blood pressure monitor via plan to contact PCP for ordering blood pressure monitor through [place];  Counseled on the importance of exercise goals with  target of 150 minutes per week Discussed plans with patient for ongoing care management follow up and provided patient with direct contact information for care management team; Reviewed scheduled/upcoming provider appointments including:  Assessed social determinant of health barriers;   Patient Goals/Self-Care Activities: Patient will self administer medications as prescribed Patient will attend all scheduled provider appointments Patient will call pharmacy for medication refills Patient will continue to perform ADL's independently Patient will continue to perform IADL's independently Patient will call provider office for new concerns or questions       Follow Up:  Patient agrees to Care Plan and Follow-up.  Plan: The Managed Medicaid care management team will reach out to the patient again over the next 30 days.  Date/time of next scheduled RN care management/care coordination outreach:  08/11/2021 at 2:00pm  Salvatore Marvel RN, [facility] Mobile: [phone]

## 2021-07-14 NOTE — Patient Instructions (Signed)
Visit Information  [name] was given information about Medicaid Managed Care team care coordination services as a part of their [facility] Medicaid benefit. Patrick [name] verbally consented to engagement with the [facility] Managed Care team.   If you are experiencing a medical emergency, please call 911 or report to your local emergency department or urgent care.   If you have a non-emergency medical problem during routine business hours, please contact your provider's office and ask to speak with a nurse.   For questions related to your [facility] health plan, please call: [phone] or go here:[email or web address]  If you would like to schedule transportation through your [facility] plan, please call the following number at least 2 days in advance of your appointment: [phone].  Call the [facility] at [phone], at any time, 24 hours a day, 7 days a week. If you are in danger or need immediate medical attention call 911.  If you would like help to quit smoking, call 1-800-QUIT-NOW (915)163-4398) OR Espaol: 1-855-Djelo-Ya (4-287-681-1572) o para ms informacin haga clic aqu or Text READY to 200-400 to register via text  Patrick [name] - following are the goals we discussed in your visit today:   Goals Addressed             This Visit's Progress    RNCM: Glycemic Management       Timeframe:  Long-Range Goal Priority:  High Start Date:   07/14/2021                          Expected End Date:  11/11/2021    Patient Goals/Self-Care Activities: Patient will self administer medications as prescribed Patient will attend all scheduled provider appointments Patient will call pharmacy for medication refills Patient will continue to perform ADL's independently Patient will continue to perform IADL's independently Patient will call provider office for new concerns or questions                    RNCM: Hypertension Monitoring & Management        Timeframe:  Long-Range Goal Priority:  High Start Date: 07/14/2021                         Expected End Date: 11/11/2021        Patient Goals/Self-Care Activities: Patient will self administer medications as prescribed Patient will attend all scheduled provider appointments Patient will call pharmacy for medication refills Patient will continue to perform ADL's independently Patient will continue to perform IADL's independently Patient will call provider office for new concerns or questions                    Please see education materials related to HTN & DM I provided by MyChart link.  Patient will review today's visit and plan of care via My Chart portal.  The Managed Medicaid care management team will reach out to the patient again over the next 30 days.   Patrick Marvel RN, BSN Community Care Coordinator [facility] Mobile: [phone]   Following is a copy of your plan of care:  Patient Care Plan: RNCM Plan of Care     Problem Identified: Chronic Disease Management & Care Coordination Needs for DMI & HTN      Long-Range Goal: Development of Plan of Care for Chronic Disease Management & Care Coordination Needs   Start Date: 07/14/2021  Expected End Date: 11/11/2021  Priority: High  Note:   Current Barriers:  Knowledge Deficits related to plan of care for management of HTN and DM I Chronic Disease Management support and education needs related to HTN and DM I  RNCM Clinical Goal(s):  Patient will verbalize understanding of plan for management of HTN and DM I verbalize basic understanding of HTN and DM I disease process and self health management plan   take all medications exactly as prescribed and will call provider for medication related questions attend all scheduled medical appointments: Endocrinologist appointment on 09/09/2021 and PCP appointment on 09/01/2021  demonstrate ongoing adherence to prescribed treatment plan for  HTN and DM I as evidenced by daily monitoring and recording of CBG  adherence to ADA/ carb modified diet exercise 5 days/week adherence to prescribed medication regimen contacting provider for new or worsened symptoms or questions   demonstrate ongoing health management independence   continue to work with RN Care Manager to address care management and care coordination needs related to HTN and DM I  work with community resource care guide to address needs related to   finding a dental group that works with [employer] patient to repair dentures  through collaboration with Consulting civil engineer, provider, and care team.   Interventions: Inter-disciplinary care team collaboration (see longitudinal plan of care) Evaluation of current treatment plan related to  self management and patient's adherence to plan as established by provider   Diabetes:  (Status: New goal.) Lab Results  Component Value Date   HGBA1C 7.9 (A) 05/27/2021  Assessed patient's understanding of A1c goal: <7% Provided education to patient about basic DM disease process; Reviewed medications with patient and discussed importance of medication adherence;        Counseled on importance of regular laboratory monitoring as prescribed;        Discussed plans with patient for ongoing care management follow up and provided patient with direct contact information for care management team;      Provided patient with verbal education related to hypo and hyperglycemia and importance of correct treatment;       Reviewed scheduled/upcoming provider appointments including: PCP appointment on 09/01/2021 and Endocrinologist on 09/09/2021;         Advised patient, providing education and rationale, to check cbg 3 times/day and record.  Patient has continuous blood sugar monitor Dexcom for last 2 - 3 months and likes it.      Referral made to community resources care guide team for assistance with finding dental group that works with medicaid patients to  repair dentures;      Review of patient status, including review of consultants reports, relevant laboratory and other test results, and medications completed;       Assessed social determinant of health barriers;         Hypertension: (Status: New goal.) Last practice recorded BP readings:  BP Readings from Last 3 Encounters:  06/02/21 121/83  05/27/21 110/72  05/05/21 (!) 139/96  Most recent eGFR/CrCl:  Lab Results  Component Value Date   EGFR 65 06/02/2021    No components found for: CRCL  Evaluation of current treatment plan related to hypertension self management and patient's adherence to plan as established by provider;   Reviewed medications with patient and discussed importance of compliance;  Provided assistance with obtaining home blood pressure monitor via plan to contact PCP for ordering blood pressure monitor through [facility];  Counseled on the importance of exercise goals with target  of 150 minutes per week Discussed plans with patient for ongoing care management follow up and provided patient with direct contact information for care management team; Reviewed scheduled/upcoming provider appointments including:  Assessed social determinant of health barriers;   Patient Goals/Self-Care Activities: Patient will self administer medications as prescribed Patient will attend all scheduled provider appointments Patient will call pharmacy for medication refills Patient will continue to perform ADL's independently Patient will continue to perform IADL's independently Patient will call provider office for new concerns or questions

## 2021-08-28 NOTE — Progress Notes (Signed)
Patient ID: Patrick [name], male    DOB: Sep 16, 1961  MRN: [id number]  CC: Hypertension Follow-Up   Subjective: Patrick [name] is a 60 y.o. male who presents for hypertension follow-up.   His concerns today include:   HYPERTENSION FOLLOW-UP: 06/02/2021: - Continue Losartan-Hydrochlorothiazide as prescribed.   09/01/2021: Doing well on current regimen. No side effects. No issues/concerns. Denies chest pain and shortness of breath. Reports recently had appointment with RN case manager and was told that a blood pressure monitor can be sent to [facility] and Medicaid would cover and would like completed to see.   2. PSA FOLLOW-UP: Reports has not heard from Urology since initial referral placement in July 2022.    Patient Active Problem List   Diagnosis Date Noted   Generalized anxiety disorder 05/05/2021   Essential hypertension 04/21/2021   Type 1 diabetes mellitus (HCC) 03/22/2014   Cirrhosis of liver (HCC) 03/14/2014   Colon cancer screening 03/14/2014   Hypotension, unspecified 03/01/2014   DKA (diabetic ketoacidoses) 02/28/2014   Hyponatremia 02/28/2014   Hepatic encephalopathy 08/24/2012   Anemia 08/23/2012   Malnutrition (HCC) 08/23/2012   Acute renal failure (HCC) 08/23/2012   Thrombocytopenia (HCC) 08/23/2012   Coagulopathy (HCC) 08/23/2012   Ascites 08/22/2012   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     Current Outpatient Medications on File Prior to Visit  Medication Sig Dispense Refill   ACCU-CHEK AVIVA PLUS test strip 1 EACH BY OTHER ROUTE 4 (FOUR) TIMES DAILY. AND LANCETS 4/DAY 100 strip 13   B-D UF III MINI PEN NEEDLES 31G X 5 MM MISC USE 3 TIMES DAILY 300 each 0   cycloSPORINE (RESTASIS) 0.05 % ophthalmic emulsion Place 1 drop into both eyes 2 (two) times daily.     insulin aspart (NOVOLOG FLEXPEN) 100 UNIT/ML FlexPen Inject 5 Units into the skin 3 (three) times daily with meals. 15 mL 11   insulin glargine (LANTUS SOLOSTAR) 100 UNIT/ML  Solostar Pen Inject 4 Units into the skin at bedtime. 15 mL PRN   latanoprost (XALATAN) 0.005 % ophthalmic solution SMARTSIG:1 Drop(s) In Eye(s) Every Evening     Multiple Vitamin (MULTIVITAMIN WITH MINERALS) TABS tablet Take 1 tablet by mouth daily.     sildenafil (VIAGRA) 25 MG tablet Take 1 tablet 1/2 hour to 1 hour prior to intercourse as needed. Limit use to 1/2 tablet or 1 tablet per 24 hours. (Patient not taking: Reported on 07/14/2021) 20 tablet 0   No current facility-administered medications on file prior to visit.    No Known Allergies  Social History   Socioeconomic History   Marital status: Single    Spouse name: Not on file   Number of children: 1   Years of education: Not on file   Highest education level: Not on file  Occupational History   Occupation: unemployed  Tobacco Use   Smoking status: Former    Packs/day: 0.00    Years: 15.00    Pack years: 0.00    Types: Cigarettes    Quit date: 07/14/2010    Years since quitting: 11.1    Passive exposure: Past   Smokeless tobacco: Never  Substance and Sexual Activity   Alcohol use: No    Comment: Not using for last 11 years   Drug use: No   Sexual activity: Not on file  Other Topics Concern   Not on file  Social History Narrative   Not on file   Social Determinants of Health   Financial  Resource Strain: Low Risk    Difficulty of Paying Living Expenses: Not very hard  Food Insecurity: No Food Insecurity   Worried About Programme researcher, broadcasting/film/video in the Last Year: Never true   Ran Out of Food in the Last Year: Never true  Transportation Needs: No Transportation Needs   Lack of Transportation (Medical): No   Lack of Transportation (Non-Medical): No  Physical Activity: Inactive   Days of Exercise per Week: 0 days   Minutes of Exercise per Session: 0 min  Stress: Stress Concern Present   Feeling of Stress : To some extent  Social Connections: Unknown   Frequency of Communication with Friends and Family: More than  three times a week   Frequency of Social Gatherings with Friends and Family: More than three times a week   Attends Religious Services: Not on Scientist, clinical (histocompatibility and immunogenetics) or Organizations: No   Attends Banker Meetings: Never   Marital Status: Living with partner  Intimate Partner Violence: Not on file    Family History  Problem Relation Age of Onset   Heart disease Mother        bi-pass   Hypertension Mother    Cancer Brother        unsure type   Hypertension Father    Colon cancer Neg Hx     Past Surgical History:  Procedure Laterality Date   EYE SURGERY N/A    Phreesia 02/21/2021   MOUTH SURGERY     on gums   TONSILLECTOMY      ROS: Review of Systems Negative except as stated above  PHYSICAL EXAM: BP 135/79   Pulse 92   SpO2 97%   ASSESSMENT AND PLAN: 1. Essential hypertension: - Continue Losartan-Hydrochlorothiazide as prescribed.  - Counseled on blood pressure goal of less than 130/80, low-sodium, DASH diet, medication compliance, 150 minutes of moderate intensity exercise per week as tolerated. Discussed medication compliance, adverse effects. - Asked Guy Franco, RN to fax over blood pressure monitor order to [facility].  - Follow-up with primary provider in 3 months or sooner if needed.  - losartan-hydrochlorothiazide (HYZAAR) 100-25 MG tablet; Take 1 tablet by mouth daily.  Dispense: 90 tablet; Refill: 0 - Blood Pressure Monitor DEVI; Use as directed to check home blood pressure at least 4 times a week. Please notify primary provider of blood pressures greater than 140/90. Please send prescription to [facility].  Phone Number [phone] Fax Number [phone]  Dispense: 1 each; Refill: 0  2. Elevated PSA: - Referral to Urology for further evaluation and management. This is a repeat referral from July 2022 of which patient reports he was not contacted for appointment.  - Counseled to notify primary provider if no appointment  within the next 1 to 2 weeks. Patient verbalized understanding. - Ambulatory referral to Urology  3. Need for immunization against influenza: - Administered today in office.  - Flu Vaccine QUAD 96mo+IM (Fluarix, Fluzone & Alfiuria Quad PF)    Patient was given the opportunity to ask questions.  Patient verbalized understanding of the plan and was able to repeat key elements of the plan. Patient was given clear instructions to go to Emergency Department or return to medical center if symptoms don't improve, worsen, or new problems develop.The patient verbalized understanding.   Orders Placed This Encounter  Procedures   Flu Vaccine QUAD 51mo+IM (Fluarix, Fluzone & Alfiuria Quad PF)   Ambulatory referral to Urology     Requested Prescriptions  Signed Prescriptions Disp Refills   losartan-hydrochlorothiazide (HYZAAR) 100-25 MG tablet 90 tablet 0    Sig: Take 1 tablet by mouth daily.   Blood Pressure Monitor DEVI 1 each 0    Sig: Use as directed to check home blood pressure at least 4 times a week. Please notify primary provider of blood pressures greater than 140/90. Please send prescription to [facility].  Phone Number [phone] Fax Number [phone]    Return in about 3 months (around 12/02/2021) for Follow-Up or next available hypertension .  [name], NP

## 2021-08-29 ENCOUNTER — Other Ambulatory Visit: Payer: Self-pay | Admitting: Family

## 2021-08-29 DIAGNOSIS — I10 Essential (primary) hypertension: Secondary | ICD-10-CM

## 2021-09-01 ENCOUNTER — Encounter: Payer: Self-pay | Admitting: Family

## 2021-09-01 ENCOUNTER — Ambulatory Visit (INDEPENDENT_AMBULATORY_CARE_PROVIDER_SITE_OTHER): Payer: Medicaid Other | Admitting: Family

## 2021-09-01 ENCOUNTER — Other Ambulatory Visit: Payer: Self-pay | Admitting: *Deleted

## 2021-09-01 ENCOUNTER — Other Ambulatory Visit: Payer: Self-pay

## 2021-09-01 VITALS — BP 135/79 | HR 92 | Ht 71.0 in | Wt 170.0 lb

## 2021-09-01 DIAGNOSIS — I10 Essential (primary) hypertension: Secondary | ICD-10-CM

## 2021-09-01 DIAGNOSIS — R972 Elevated prostate specific antigen [PSA]: Secondary | ICD-10-CM | POA: Diagnosis not present

## 2021-09-01 DIAGNOSIS — Z23 Encounter for immunization: Secondary | ICD-10-CM | POA: Diagnosis not present

## 2021-09-01 MED ORDER — BLOOD PRESSURE MONITOR DEVI: 0 refills | Status: AC

## 2021-09-01 MED ORDER — LOSARTAN POTASSIUM-HCTZ 100-25 MG PO TABS: 1.0000 | ORAL_TABLET | Freq: Every day | ORAL | 0 refills | Status: DC

## 2021-09-01 MED ORDER — BLOOD PRESSURE MONITOR DEVI: 0 refills | Status: DC

## 2021-09-01 NOTE — Patient Instructions (Signed)

## 2021-09-09 ENCOUNTER — Other Ambulatory Visit: Payer: Self-pay

## 2021-09-09 ENCOUNTER — Encounter: Payer: Self-pay | Admitting: Endocrinology

## 2021-09-09 ENCOUNTER — Ambulatory Visit (INDEPENDENT_AMBULATORY_CARE_PROVIDER_SITE_OTHER): Payer: Medicaid Other | Admitting: Endocrinology

## 2021-09-09 VITALS — BP 110/78 | HR 82 | Ht 71.0 in | Wt 174.2 lb

## 2021-09-09 DIAGNOSIS — E1042 Type 1 diabetes mellitus with diabetic polyneuropathy: Secondary | ICD-10-CM | POA: Diagnosis not present

## 2021-09-09 LAB — POCT GLYCOSYLATED HEMOGLOBIN (HGB A1C): Hemoglobin A1C: 7.8 % — AB (ref 4.0–5.6)

## 2021-09-09 MED ORDER — NOVOLOG FLEXPEN 100 UNIT/ML ~~LOC~~ SOPN
5.0000 [IU] | PEN_INJECTOR | Freq: Three times a day (TID) | SUBCUTANEOUS | 11 refills | Status: DC
Start: 2021-09-09 — End: 2021-12-16

## 2021-09-09 MED ORDER — LANTUS SOLOSTAR 100 UNIT/ML ~~LOC~~ SOPN: 5.0000 [IU] | PEN_INJECTOR | Freq: Every day | SUBCUTANEOUS | 99 refills | Status: DC

## 2021-09-09 NOTE — Progress Notes (Signed)
Subjective:    Patient ID: Patrick [name], male    DOB: 1961-03-05, 60 y.o.   MRN: [id number]  HPI Pt returns for f/u of diabetes mellitus:   DM type: 1, vs chronic pancreatitis Dx'ed: 2015.  Complications: PPN and stage 3 CRI Therapy: insulin since 2020 DKA: only once, at time of dx.  Severe hypoglycemia: never.  Pancreatitis: never.  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  Glucose varies from 90-250.  It is in general highest at 1AM-5AM, and at [facility].  Otherwise, There is little trend throughout the day.  He says eating at HS causes fasting hyperglycemia.  He takes insulin as rx'ed.  Past Medical History:  Diagnosis Date   Cirrhosis (HCC)    Diabetes (HCC)    type 1   Diabetes mellitus without complication (HCC)    Phreesia 02/21/2021   ETOH abuse     Past Surgical History:  Procedure Laterality Date   EYE SURGERY N/A    Phreesia 02/21/2021   MOUTH SURGERY     on gums   TONSILLECTOMY      Social History   Socioeconomic History   Marital status: Single    Spouse name: Not on file   Number of children: 1   Years of education: Not on file   Highest education level: Not on file  Occupational History   Occupation: unemployed  Tobacco Use   Smoking status: Former    Packs/day: 0.00    Years: 15.00    Pack years: 0.00    Types: Cigarettes    Quit date: 07/14/2010    Years since quitting: 11.1    Passive exposure: Past   Smokeless tobacco: Never  Substance and Sexual Activity   Alcohol use: No    Comment: Not using for last 11 years   Drug use: No   Sexual activity: Not on file  Other Topics Concern   Not on file  Social History Narrative   Not on file   Social Determinants of Health   Financial Resource Strain: Low Risk     Difficulty of Paying Living Expenses: Not very hard  Food Insecurity: No Food Insecurity   Worried About Running Out of Food in the Last Year: Never true   Ran Out of Food in the Last Year: Never true  Transportation Needs: No Transportation Needs   Lack of Transportation (Medical): No   Lack of Transportation (Non-Medical): No  Physical Activity: Inactive   Days of Exercise per Week: 0 days   Minutes of Exercise per Session: 0 min  Stress: Stress Concern Present   Feeling of Stress : To some extent  Social Connections: Unknown   Frequency of Communication with Friends and Family: More than three times a week   Frequency of Social Gatherings with Friends and Family: More than three times a week   Attends Religious Services: Not on Scientist, clinical (histocompatibility and immunogenetics) or Organizations: No   Attends Banker Meetings: Never   Marital Status: Living with partner  Intimate Partner Violence: Not on file    Current Outpatient Medications on File Prior to Visit  Medication Sig Dispense Refill   ACCU-CHEK AVIVA PLUS test strip 1 EACH BY OTHER ROUTE 4 (FOUR) TIMES DAILY. AND LANCETS  4/DAY 100 strip 13   B-D UF III MINI PEN NEEDLES 31G X 5 MM MISC USE 3 TIMES DAILY 300 each 0   Blood Pressure Monitor DEVI Use as directed to check home blood pressure at least 4 times a week. Please notify primary provider of blood pressures greater than 140/90. Please send prescription to [facility].  Phone Number [phone] Fax Number [phone] 1 each 0   cycloSPORINE (RESTASIS) 0.05 % ophthalmic emulsion Place 1 drop into both eyes 2 (two) times daily.     latanoprost (XALATAN) 0.005 % ophthalmic solution SMARTSIG:1 Drop(s) In Eye(s) Every Evening     losartan-hydrochlorothiazide (HYZAAR) 100-25 MG tablet Take 1 tablet by mouth daily. 90 tablet 0   Multiple Vitamin (MULTIVITAMIN WITH MINERALS) TABS tablet Take 1 tablet by mouth daily.     sildenafil (VIAGRA) 25 MG tablet Take 1 tablet 1/2  hour to 1 hour prior to intercourse as needed. Limit use to 1/2 tablet or 1 tablet per 24 hours. 20 tablet 0   No current facility-administered medications on file prior to visit.    No Known Allergies  Family History  Problem Relation Age of Onset   Heart disease Mother        bi-pass   Hypertension Mother    Cancer Brother        unsure type   Hypertension Father    Colon cancer Neg Hx     BP 110/78 (BP Location: Right Arm, Patient Position: Sitting, Cuff Size: Normal)   Pulse 82   Ht 5\' 11"  (1.803 m)   Wt 174 lb 3.2 oz (79 kg)   SpO2 97%   BMI 24.30 kg/m    Review of Systems     Objective:   Physical Exam Pulses: dorsalis pedis intact bilat.   MSK: no deformity of the feet CV: no leg edema Skin:  no ulcer on the feet.  normal color and temp on the feet. Neuro: sensation is intact to touch on the feet   Lab Results  Component Value Date   HGBA1C 7.8 (A) 09/09/2021      Assessment & Plan:  Type 1 DM: uncontrolled.  Patient Instructions  check your blood sugar 4 times a day.  vary the time of day when you check, between before the 3 meals, and at bedtime.  also check if you have symptoms of your blood sugar being too high or too low.  please keep a record of the readings and bring it to your next appointment here (or you can bring the meter itself).  You can write it on any piece of paper.  please call 09/11/2021 sooner if your blood sugar goes below 70, or if you have a lot of readings over 200.   I have sent a prescription to your pharmacy, to increase the Lantus to 5 units at bedtime Please continue the same Novolog. Please come back for a follow-up appointment in 3 months.

## 2021-09-09 NOTE — Patient Instructions (Addendum)
check your blood sugar 4 times a day.  vary the time of day when you check, between before the 3 meals, and at bedtime.  also check if you have symptoms of your blood sugar being too high or too low.  please keep a record of the readings and bring it to your next appointment here (or you can bring the meter itself).  You can write it on any piece of paper.  please call us sooner if your blood sugar goes below 70, or if you have a lot of readings over 200.   I have sent a prescription to your pharmacy, to increase the Lantus to 5 units at bedtime Please continue the same Novolog. Please come back for a follow-up appointment in 3 months.

## 2021-09-14 ENCOUNTER — Other Ambulatory Visit: Payer: Self-pay | Admitting: Endocrinology

## 2021-11-22 ENCOUNTER — Other Ambulatory Visit: Payer: Self-pay | Admitting: Family

## 2021-11-22 DIAGNOSIS — I10 Essential (primary) hypertension: Secondary | ICD-10-CM

## 2021-11-25 NOTE — Progress Notes (Signed)
Patient ID: Patrick [name], male    DOB: 02/01/1961  MRN: [id number]  CC: Hypertension Follow-Up  Subjective: Patrick [name] is a 61 y.o. male who presents for hypertension follow-up.   His concerns today include:  HYPERTENSION FOLLOW-UP: 10/12/222: - Continue Losartan-Hydrochlorothiazide as prescribed.   12/02/2021: Doing well on current regimen, no issues/concerns.  2. NEUROPATHY: Requesting Gabapentin for neuropathy of bilateral lower extremities. Was taking in the past but reports became more physically active and no longer needed. Still followed by Endocrinology with plans to see them in a few months.   Patient Active Problem List   Diagnosis Date Noted   Generalized anxiety disorder 05/05/2021   Essential hypertension 04/21/2021   Type 1 diabetes mellitus (HCC) 03/22/2014   Cirrhosis of liver (HCC) 03/14/2014   Colon cancer screening 03/14/2014   Hypotension, unspecified 03/01/2014   DKA (diabetic ketoacidoses) 02/28/2014   Hyponatremia 02/28/2014   Hepatic encephalopathy 08/24/2012   Anemia 08/23/2012   Malnutrition (HCC) 08/23/2012   Acute renal failure (HCC) 08/23/2012   Thrombocytopenia (HCC) 08/23/2012   Coagulopathy (HCC) 08/23/2012   Ascites 08/22/2012   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     Current Outpatient Medications on File Prior to Visit  Medication Sig Dispense Refill   ACCU-CHEK AVIVA PLUS test strip 1 EACH BY OTHER ROUTE 4 (FOUR) TIMES DAILY. AND LANCETS 4/DAY 100 strip 13   B-D UF III MINI PEN NEEDLES 31G X 5 MM MISC USE 3 TIMES DAILY 300 each 0   Blood Pressure Monitor DEVI Use as directed to check home blood pressure at least 4 times a week. Please notify primary provider of blood pressures greater than 140/90. Please send prescription to [facility].  Phone Number [phone] Fax Number [phone] 1 each 0   cycloSPORINE (RESTASIS) 0.05 % ophthalmic emulsion Place 1 drop into both eyes 2 (two) times daily.      insulin aspart (NOVOLOG FLEXPEN) 100 UNIT/ML FlexPen Inject 5 Units into the skin 3 (three) times daily with meals. 15 mL 11   insulin glargine (LANTUS SOLOSTAR) 100 UNIT/ML Solostar Pen Inject 5 Units into the skin at bedtime. 15 mL PRN   latanoprost (XALATAN) 0.005 % ophthalmic solution SMARTSIG:1 Drop(s) In Eye(s) Every Evening     Multiple Vitamin (MULTIVITAMIN WITH MINERALS) TABS tablet Take 1 tablet by mouth daily.     sildenafil (VIAGRA) 25 MG tablet Take 1 tablet 1/2 hour to 1 hour prior to intercourse as needed. Limit use to 1/2 tablet or 1 tablet per 24 hours. 20 tablet 0   No current facility-administered medications on file prior to visit.    No Known Allergies  Social History   Socioeconomic History   Marital status: Single    Spouse name: Not on file   Number of children: 1   Years of education: Not on file   Highest education level: Not on file  Occupational History   Occupation: unemployed  Tobacco Use   Smoking status: Former    Packs/day: 0.00    Years: 15.00    Pack years: 0.00    Types: Cigarettes    Quit date: 07/14/2010    Years since quitting: 11.3    Passive exposure: Past   Smokeless tobacco: Never  Substance and Sexual Activity   Alcohol use: No    Comment: Not using for last 11 years   Drug use: No   Sexual activity: Not on file  Other Topics Concern   Not on file  Social  History Narrative   Not on file   Social Determinants of Health   Financial Resource Strain: Low Risk    Difficulty of Paying Living Expenses: Not very hard  Food Insecurity: No Food Insecurity   Worried About Running Out of Food in the Last Year: Never true   Ran Out of Food in the Last Year: Never true  Transportation Needs: No Transportation Needs   Lack of Transportation (Medical): No   Lack of Transportation (Non-Medical): No  Physical Activity: Inactive   Days of Exercise per Week: 0 days   Minutes of Exercise per Session: 0 min  Stress: Stress Concern Present    Feeling of Stress : To some extent  Social Connections: Unknown   Frequency of Communication with Friends and Family: More than three times a week   Frequency of Social Gatherings with Friends and Family: More than three times a week   Attends Religious Services: Not on Scientist, clinical (histocompatibility and immunogenetics) or Organizations: No   Attends Banker Meetings: Never   Marital Status: Living with partner  Intimate Partner Violence: Not on file    Family History  Problem Relation Age of Onset   Heart disease Mother        bi-pass   Hypertension Mother    Cancer Brother        unsure type   Hypertension Father    Colon cancer Neg Hx     Past Surgical History:  Procedure Laterality Date   EYE SURGERY N/A    [facility]reesia 02/21/2021   MOUTH SURGERY     on gums   TONSILLECTOMY      ROS: Review of Systems Negative except as stated above  PHYSICAL EXAM: BP 126/76 (BP Location: Left Arm, Patient Position: Sitting, Cuff Size: Normal)    Pulse 85    Temp 98.3 F (36.8 C)    Resp 18    Ht 5' 10.98" (1.803 m)    Wt 172 lb (78 kg)    SpO2 98%    BMI 24.00 kg/m   Physical Exam HENT:     Head: Normocephalic and atraumatic.  Eyes:     Extraocular Movements: Extraocular movements intact.     Conjunctiva/sclera: Conjunctivae normal.     Pupils: Pupils are equal, round, and reactive to light.  Cardiovascular:     Rate and Rhythm: Normal rate and regular rhythm.     Pulses: Normal pulses.     Heart sounds: Normal heart sounds.  Pulmonary:     Effort: Pulmonary effort is normal.     Breath sounds: Normal breath sounds.  Musculoskeletal:     Cervical back: Normal range of motion and neck supple.  Neurological:     General: No focal deficit present.     Mental Status: He is alert and oriented to person, place, and time.  Psychiatric:        Mood and Affect: Mood normal.        Behavior: Behavior normal.   ASSESSMENT AND PLAN: 1. Essential hypertension: - Continue  Losartan-Hydrochlorothiazide as prescribed.  - Counseled on blood pressure goal of less than 130/80, low-sodium, DASH diet, medication compliance, 150 minutes of moderate intensity exercise per week as tolerated. Discussed medication compliance, adverse effects. - Update BMP.  - Follow-up with primary provider in 3 months or sooner if needed.  - Basic Metabolic Panel - losartan-hydrochlorothiazide (HYZAAR) 100-25 MG tablet; Take 1 tablet by mouth daily.  Dispense: 90 tablet; Refill: 0  2. Type 1 diabetes mellitus with diabetic polyneuropathy (HCC): - Gabapentin as prescribed. Counseled on medication adherence and adverse effects.  - Keep all scheduled appointments with Endocrinology.  - Follow-up with primary provider as scheduled.  - gabapentin (NEURONTIN) 300 MG capsule; Take 1 capsule (300 mg total) by mouth at bedtime.  Dispense: 30 capsule; Refill: 1   Patient was given the opportunity to ask questions.  Patient verbalized understanding of the plan and was able to repeat key elements of the plan. Patient was given clear instructions to go to Emergency Department or return to medical center if symptoms don't improve, worsen, or new problems develop.The patient verbalized understanding.   Orders Placed This Encounter  Procedures   Basic Metabolic Panel     Requested Prescriptions   Signed Prescriptions Disp Refills   gabapentin (NEURONTIN) 300 MG capsule 30 capsule 1    Sig: Take 1 capsule (300 mg total) by mouth at bedtime.   losartan-hydrochlorothiazide (HYZAAR) 100-25 MG tablet 90 tablet 0    Sig: Take 1 tablet by mouth daily.    Return in about 3 months (around 03/02/2022) for Follow-Up or next available hypertension, 4 weeks neuropathy  .  [name], NP

## 2021-11-30 ENCOUNTER — Other Ambulatory Visit: Payer: Self-pay | Admitting: Endocrinology

## 2021-12-02 ENCOUNTER — Ambulatory Visit (INDEPENDENT_AMBULATORY_CARE_PROVIDER_SITE_OTHER): Payer: Medicaid Other | Admitting: Family

## 2021-12-02 ENCOUNTER — Other Ambulatory Visit: Payer: Self-pay

## 2021-12-02 ENCOUNTER — Encounter: Payer: Self-pay | Admitting: Family

## 2021-12-02 VITALS — BP 126/76 | HR 85 | Temp 98.3°F | Resp 18 | Ht 70.98 in | Wt 172.0 lb

## 2021-12-02 DIAGNOSIS — E1042 Type 1 diabetes mellitus with diabetic polyneuropathy: Secondary | ICD-10-CM | POA: Diagnosis not present

## 2021-12-02 DIAGNOSIS — I10 Essential (primary) hypertension: Secondary | ICD-10-CM | POA: Diagnosis not present

## 2021-12-02 MED ORDER — LOSARTAN POTASSIUM-HCTZ 100-25 MG PO TABS: 1.0000 | ORAL_TABLET | Freq: Every day | ORAL | 0 refills | Status: DC

## 2021-12-02 MED ORDER — GABAPENTIN 300 MG PO CAPS: 300.0000 mg | ORAL_CAPSULE | Freq: Every day | ORAL | 1 refills | Status: DC

## 2021-12-02 NOTE — Patient Instructions (Signed)
Mediterranean Diet °A Mediterranean diet refers to food and lifestyle choices that are based on the traditions of countries located on the [place] Sea. It focuses on eating more fruits, vegetables, whole grains, beans, nuts, seeds, and heart-healthy fats, and eating less dairy, meat, eggs, and processed foods with added sugar, salt, and fat. This way of eating has been shown to help prevent certain conditions and improve outcomes for people who have chronic diseases, like kidney disease and heart disease. °What are tips for following this plan? °Reading food labels °Check the serving size of packaged foods. For foods such as rice and pasta, the serving size refers to the amount of cooked product, not dry. °Check the total fat in packaged foods. Avoid foods that have saturated fat or trans fats. °Check the ingredient list for added sugars, such as corn syrup. °Shopping ° °Buy a variety of foods that offer a balanced diet, including: °Fresh fruits and vegetables (produce). °Grains, beans, nuts, and seeds. Some of these may be available in unpackaged forms or large amounts (in bulk). °Fresh seafood. °Poultry and eggs. °Low-fat dairy products. °Buy whole ingredients instead of prepackaged foods. °Buy fresh fruits and vegetables in-season from local farmers markets. °Buy plain frozen fruits and vegetables. °If you do not have access to quality fresh seafood, buy precooked frozen shrimp or canned fish, such as tuna, salmon, or sardines. °Stock your pantry so you always have certain foods on hand, such as olive oil, canned tuna, canned tomatoes, rice, pasta, and beans. °Cooking °Cook foods with extra-virgin olive oil instead of using butter or other vegetable oils. °Have meat as a side dish, and have vegetables or grains as your main dish. This means having meat in small portions or adding small amounts of meat to foods like pasta or stew. °Use beans or vegetables instead of meat in common dishes like chili or  lasagna. °Experiment with different cooking methods. Try roasting, broiling, steaming, and sautéing vegetables. °Add frozen vegetables to soups, stews, pasta, or rice. °Add nuts or seeds for added healthy fats and plant protein at each meal. You can add these to yogurt, salads, or vegetable dishes. °Marinate fish or vegetables using olive oil, lemon juice, garlic, and fresh herbs. °Meal planning °Plan to eat one vegetarian meal one day each week. Try to work up to two vegetarian meals, if possible. °Eat seafood two or more times a week. °Have healthy snacks readily available, such as: °Vegetable sticks with hummus. °Greek yogurt. °Fruit and nut trail mix. °Eat balanced meals throughout the week. This includes: °Fruit: 2-3 servings a day. °Vegetables: 4-5 servings a day. °Low-fat dairy: 2 servings a day. °Fish, poultry, or lean meat: 1 serving a day. °Beans and legumes: 2 or more servings a week. °Nuts and seeds: 1-2 servings a day. °Whole grains: 6-8 servings a day. °Extra-virgin olive oil: 3-4 servings a day. °Limit red meat and sweets to only a few servings a month. °Lifestyle ° °Cook and eat meals together with your family, when possible. °Drink enough fluid to keep your urine pale yellow. °Be physically active every day. This includes: °Aerobic exercise like running or swimming. °Leisure activities like gardening, walking, or housework. °Get 7-8 hours of sleep each night. °If recommended by your health care provider, drink red wine in moderation. This means 1 glass a day for nonpregnant women and 2 glasses a day for men. A glass of wine equals 5 oz (150 mL). °What foods should I eat? °Fruits °Apples. Apricots. Avocado. Berries. Bananas. Cherries. Dates.   Figs. Grapes. Lemons. Melon. Oranges. Peaches. Plums. Pomegranate. °Vegetables °Artichokes. Beets. Broccoli. Cabbage. Carrots. Eggplant. Green beans. Chard. Kale. Spinach. Onions. Leeks. Peas. Squash. Tomatoes. Peppers. Radishes. °Grains °Whole-grain pasta. Brown  rice. Bulgur wheat. Polenta. Couscous. Whole-wheat bread. Oatmeal. Quinoa. °Meats and other proteins °Beans. Almonds. Sunflower seeds. Pine nuts. Peanuts. Cod. Salmon. Scallops. Shrimp. Tuna. Tilapia. Clams. Oysters. Eggs. Poultry without skin. °Dairy °Low-fat milk. Cheese. Greek yogurt. °Fats and oils °Extra-virgin olive oil. Avocado oil. Grapeseed oil. °Beverages °Water. Red wine. Herbal tea. °Sweets and desserts °Greek yogurt with honey. Baked apples. Poached pears. Trail mix. °Seasonings and condiments °Basil. Cilantro. Coriander. Cumin. Mint. Parsley. Sage. Rosemary. Tarragon. Garlic. Oregano. Thyme. Pepper. Balsamic vinegar. Tahini. Hummus. Tomato sauce. Olives. Mushrooms. °The items listed above may not be a complete list of foods and beverages you can eat. Contact a dietitian for more information. °What foods should I limit? °This is a list of foods that should be eaten rarely or only on special occasions. °Fruits °Fruit canned in syrup. °Vegetables °Deep-fried potatoes (french fries). °Grains °Prepackaged pasta or rice dishes. Prepackaged cereal with added sugar. Prepackaged snacks with added sugar. °Meats and other proteins °Beef. Pork. Lamb. Poultry with skin. Hot dogs. Bacon. °Dairy °Ice cream. Sour cream. Whole milk. °Fats and oils °Butter. Canola oil. Vegetable oil. Beef fat (tallow). Lard. °Beverages °Juice. Sugar-sweetened soft drinks. Beer. Liquor and spirits. °Sweets and desserts °Cookies. Cakes. Pies. Candy. °Seasonings and condiments °Mayonnaise. Pre-made sauces and marinades. °The items listed above may not be a complete list of foods and beverages you should limit. Contact a dietitian for more information. °Summary °The Mediterranean diet includes both food and lifestyle choices. °Eat a variety of fresh fruits and vegetables, beans, nuts, seeds, and whole grains. °Limit the amount of red meat and sweets that you eat. °If recommended by your health care provider, drink red wine in moderation.  This means 1 glass a day for nonpregnant women and 2 glasses a day for men. A glass of wine equals 5 oz (150 mL). °This information is not intended to replace advice given to you by your health care provider. Make sure you discuss any questions you have with your health care provider. °Document Revised: 12/13/2019 Document Reviewed: 10/10/2019 °Elsevier Patient Education © 2022 Elsevier Inc. ° °

## 2021-12-02 NOTE — Progress Notes (Signed)
Pt presents for hypertension follow-up  ?

## 2021-12-03 LAB — BASIC METABOLIC PANEL
BUN/Creatinine Ratio: 12 (ref 9–20)
BUN: 15 mg/dL (ref 6–24)
CO2: 27 mmol/L (ref 20–29)
Calcium: 9.9 mg/dL (ref 8.7–10.2)
Chloride: 100 mmol/L (ref 96–106)
Creatinine, Ser: 1.29 mg/dL — ABNORMAL HIGH (ref 0.76–1.27)
Glucose: 143 mg/dL — ABNORMAL HIGH (ref 70–99)
Potassium: 4.5 mmol/L (ref 3.5–5.2)
Sodium: 142 mmol/L (ref 134–144)
eGFR: 64 mL/min/{1.73_m2} (ref >59)

## 2021-12-03 NOTE — Progress Notes (Signed)
Kidney function normal

## 2021-12-08 ENCOUNTER — Telehealth: Payer: Self-pay | Admitting: Family

## 2021-12-08 NOTE — Telephone Encounter (Signed)
.. °  Medicaid Managed Care   Unsuccessful Outreach Note  12/08/2021 Name: Patrick [name] MRN: [id number] DOB: Mar 02, 1961  Referred by: [name], NP Reason for referral : High Risk Managed Medicaid (I called the patient today to get him rescheduled for a phone visit with the MM RNCM. I left my name and number on his VM.)   An unsuccessful telephone outreach was attempted today. The patient was referred to the case management team for assistance with care management and care coordination.   Follow Up Plan: The care management team will reach out to the patient again over the next 7 days.    Weston Settle Care Guide, High Risk Medicaid Managed Care Embedded Care Coordination [facility]   [facility]

## 2021-12-16 ENCOUNTER — Ambulatory Visit (INDEPENDENT_AMBULATORY_CARE_PROVIDER_SITE_OTHER): Payer: Medicaid Other | Admitting: Endocrinology

## 2021-12-16 ENCOUNTER — Other Ambulatory Visit: Payer: Self-pay

## 2021-12-16 ENCOUNTER — Encounter: Payer: Self-pay | Admitting: Endocrinology

## 2021-12-16 VITALS — BP 160/80 | HR 74 | Ht 70.0 in | Wt 179.0 lb

## 2021-12-16 DIAGNOSIS — E1042 Type 1 diabetes mellitus with diabetic polyneuropathy: Secondary | ICD-10-CM | POA: Diagnosis not present

## 2021-12-16 LAB — POCT GLYCOSYLATED HEMOGLOBIN (HGB A1C): Hemoglobin A1C: 7.6 % — AB (ref 4.0–5.6)

## 2021-12-16 MED ORDER — NOVOLOG FLEXPEN 100 UNIT/ML ~~LOC~~ SOPN: PEN_INJECTOR | SUBCUTANEOUS | 3 refills | Status: DC

## 2021-12-16 NOTE — Progress Notes (Signed)
Subjective:    Patient ID: Patrick [name], male    DOB: [birth year], 61 y.o.   MRN: [id number]  HPI Pt returns for f/u of diabetes mellitus:   DM type: 1, vs chronic pancreatitis Dx'ed: 2015.  Complications: PPN and stage 3 CRI Therapy: insulin since 2020 DKA: only once, at time of dx.  Severe hypoglycemia: never.  Pancreatitis: never.  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  He also eats in the evening.    Interval history: I reviewed continuous glucose monitor data.  Glucose varies from 95-230.  It is in general highest at 1AM-3AM, and at 9PM.  It decreases overnight.  He takes insulin as rx'ed.   Past Medical History:  Diagnosis Date   Cirrhosis (HCC)    Diabetes (HCC)    type 1   Diabetes mellitus without complication (HCC)    [facility]reesia 02/21/2021   ETOH abuse     Past Surgical History:  Procedure Laterality Date   EYE SURGERY N/A    [facility]ia 02/21/2021   MOUTH SURGERY     on gums   TONSILLECTOMY      Social History   Socioeconomic History   Marital status: Single    Spouse name: Not on file   Number of children: 1   Years of education: Not on file   Highest education level: Not on file  Occupational History   Occupation: unemployed  Tobacco Use   Smoking status: Former    Packs/day: 0.00    Years: 15.00    Pack years: 0.00    Types: Cigarettes    Quit date: 07/14/2010    Years since quitting: 11.4    Passive exposure: Past   Smokeless tobacco: Never  Substance and Sexual Activity   Alcohol use: No    Comment: Not using for last 11 years   Drug use: No   Sexual activity: Not on file  Other Topics Concern   Not on file  Social History Narrative   Not on file   Social Determinants of Health   Financial Resource Strain: Low Risk    Difficulty  of Paying Living Expenses: Not very hard  Food Insecurity: No Food Insecurity   Worried About Running Out of Food in the Last Year: Never true   Ran Out of Food in the Last Year: Never true  Transportation Needs: No Transportation Needs   Lack of Transportation (Medical): No   Lack of Transportation (Non-Medical): No  Physical Activity: Inactive   Days of Exercise per Week: 0 days   Minutes of Exercise per Session: 0 min  Stress: Stress Concern Present   Feeling of Stress : To some extent  Social Connections: Unknown   Frequency of Communication with Friends and Family: More than three times a week   Frequency of Social Gatherings with Friends and Family: More than three times a week   Attends Religious Services: Not on Scientist, clinical (histocompatibility and immunogenetics) or Organizations: No   Attends Banker Meetings: Never   Marital Status: Living with partner  Intimate Partner Violence: Not on file    Current Outpatient Medications on File Prior to Visit  Medication Sig Dispense Refill   ACCU-CHEK AVIVA PLUS test strip 1 EACH BY OTHER ROUTE 4 (FOUR) TIMES DAILY.  AND LANCETS 4/DAY 100 strip 13   B-D UF III MINI PEN NEEDLES 31G X 5 MM MISC USE 3 TIMES DAILY 300 each 0   Blood Pressure Monitor DEVI Use as directed to check home blood pressure at least 4 times a week. Please notify primary provider of blood pressures greater than 140/90. Please send prescription to [facility].  Phone Number [phone] Fax Number [phone] 1 each 0   cycloSPORINE (RESTASIS) 0.05 % ophthalmic emulsion Place 1 drop into both eyes 2 (two) times daily.     gabapentin (NEURONTIN) 300 MG capsule Take 1 capsule (300 mg total) by mouth at bedtime. 30 capsule 1   insulin glargine (LANTUS SOLOSTAR) 100 UNIT/ML Solostar Pen Inject 5 Units into the skin at bedtime. 15 mL PRN   latanoprost (XALATAN) 0.005 % ophthalmic solution SMARTSIG:1 Drop(s) In Eye(s) Every Evening     losartan-hydrochlorothiazide (HYZAAR)  100-25 MG tablet Take 1 tablet by mouth daily. 90 tablet 0   Multiple Vitamin (MULTIVITAMIN WITH MINERALS) TABS tablet Take 1 tablet by mouth daily.     sildenafil (VIAGRA) 25 MG tablet Take 1 tablet 1/2 hour to 1 hour prior to intercourse as needed. Limit use to 1/2 tablet or 1 tablet per 24 hours. 20 tablet 0   No current facility-administered medications on file prior to visit.    No Known Allergies  Family History  Problem Relation Age of Onset   Heart disease Mother        bi-pass   Hypertension Mother    Cancer Brother        unsure type   Hypertension Father    Colon cancer Neg Hx     BP (!) 160/80    Pulse 74    Ht 5\' 10"  (1.778 m)    Wt 179 lb (81.2 kg)    SpO2 98%    BMI 25.68 kg/m    Review of Systems     Objective:   Physical Exam    Lab Results  Component Value Date   HGBA1C 7.6 (A) 12/16/2021      Assessment & Plan:  DM: uncontrolled  Patient Instructions  Your blood pressure is high today.  Please see your primary care provider soon, to have it rechecked.   check your blood sugar 4 times a day.  vary the time of day when you check, between before the 3 meals, and at bedtime.  also check if you have symptoms of your blood sugar being too high or too low.  please keep a record of the readings and bring it to your next appointment here (or you can bring the meter itself).  You can write it on any piece of paper.  please call 12/18/2021 sooner if your blood sugar goes below 70, or if you have a lot of readings over 200.   Please continue the same Lantus, and increase the Novolog to 4 times a day (just before each meal): 6-6-4-2 units (the 2 is when you eat in the evening, after supper).   Please see a diabetes educator, to learn about an insulin pump.   Please come back for a follow-up appointment in 3 months.

## 2021-12-16 NOTE — Patient Instructions (Addendum)
Your blood pressure is high today.  Please see your primary care provider soon, to have it rechecked.   check your blood sugar 4 times a day.  vary the time of day when you check, between before the 3 meals, and at bedtime.  also check if you have symptoms of your blood sugar being too high or too low.  please keep a record of the readings and bring it to your next appointment here (or you can bring the meter itself).  You can write it on any piece of paper.  please call us sooner if your blood sugar goes below 70, or if you have a lot of readings over 200.   Please continue the same Lantus, and increase the Novolog to 4 times a day (just before each meal): 6-6-4-2 units (the 2 is when you eat in the evening, after supper).   Please see a diabetes educator, to learn about an insulin pump.   Please come back for a follow-up appointment in 3 months.

## 2021-12-18 ENCOUNTER — Other Ambulatory Visit: Payer: Self-pay | Admitting: Endocrinology

## 2021-12-18 DIAGNOSIS — E1042 Type 1 diabetes mellitus with diabetic polyneuropathy: Secondary | ICD-10-CM

## 2021-12-27 ENCOUNTER — Other Ambulatory Visit: Payer: Self-pay

## 2021-12-27 ENCOUNTER — Encounter: Payer: Medicaid Other | Attending: Endocrinology | Admitting: Nutrition

## 2021-12-27 DIAGNOSIS — E1065 Type 1 diabetes mellitus with hyperglycemia: Secondary | ICD-10-CM | POA: Diagnosis present

## 2021-12-27 NOTE — Patient Instructions (Signed)
Review brochures given on the 3 insulin pumps. Go to their respective web sites to learn more information about them Call if questions Call to let me know which pump you have chosen, to have Dr. Everardo All send in the prescription

## 2021-12-27 NOTE — Progress Notes (Signed)
We discussed how insulin pumps work, the advantages and disadvantages of pump therapy.  He was shown the 3 insulin pumps and we discussed advantages and disadvantages of each model.  He was given brochures on each model, and he will go home and decide which one he wants and let me know.  He had no final questions

## 2021-12-29 ENCOUNTER — Telehealth: Payer: Self-pay | Admitting: Nutrition

## 2021-12-29 NOTE — Telephone Encounter (Signed)
Pt came and dropped off Health and Product Questionnaire ([facility]) along with copy of insurance card.  Upon completion pt would like to have a call.  Gave to Lillie to give to [employer]

## 2021-12-31 NOTE — Telephone Encounter (Signed)
Form was faxed to [employer] and patient was notified of this with a voice message left on his phone.

## 2021-12-31 NOTE — Telephone Encounter (Signed)
Form faxed and patient notified.

## 2022-01-18 DIAGNOSIS — R972 Elevated prostate specific antigen [PSA]: Secondary | ICD-10-CM | POA: Insufficient documentation

## 2022-01-19 ENCOUNTER — Telehealth: Payer: Self-pay | Admitting: Nutrition

## 2022-01-19 NOTE — Telephone Encounter (Signed)
LVM that comple paperwork for his insulin pump was sent to [employer].  Patient was told to call them if he has not received word about his pump shipment. ?

## 2022-01-21 ENCOUNTER — Other Ambulatory Visit: Payer: Self-pay | Admitting: Urology

## 2022-01-21 DIAGNOSIS — R972 Elevated prostate specific antigen [PSA]: Secondary | ICD-10-CM

## 2022-01-26 ENCOUNTER — Telehealth: Payer: Self-pay

## 2022-01-26 NOTE — Telephone Encounter (Signed)
[facility] is needing a A1c and C-peptide for pt to get approved for pump supplies. I seen that they had an A1c back in January. ? ? ?

## 2022-01-26 NOTE — Telephone Encounter (Signed)
C Peptide result now printed and faxed over. ?

## 2022-02-02 ENCOUNTER — Other Ambulatory Visit: Payer: Self-pay | Admitting: Endocrinology

## 2022-02-02 ENCOUNTER — Telehealth: Payer: Self-pay | Admitting: Family

## 2022-02-02 NOTE — Telephone Encounter (Signed)
.. ?  Medicaid Managed Care  ? ?Unsuccessful Outreach Note ? ?02/02/2022 ?Name: Patrick [name] MRN: [id number] DOB: September 07, 1961 ? ?Referred by: [name], NP ?Reason for referral : High Risk Managed Medicaid (I called the patient today to get him scheduled for a follow up visit with the MM RNCM. I left my name and number on his VM.) ? ? ?A second unsuccessful telephone outreach was attempted today. The patient was referred to the case management team for assistance with care management and care coordination.  ? ?Follow Up Plan: The care management team will reach out to the patient again over the next 14 days.  ? ? ?Weston Settle ?Care Guide, High Risk Medicaid Managed Care ?Embedded Care Coordination ?[facility]  ? ? ?

## 2022-02-08 ENCOUNTER — Other Ambulatory Visit: Payer: Self-pay | Admitting: Family

## 2022-02-08 DIAGNOSIS — E1042 Type 1 diabetes mellitus with diabetic polyneuropathy: Secondary | ICD-10-CM

## 2022-02-15 ENCOUNTER — Other Ambulatory Visit: Payer: Medicaid Other

## 2022-02-23 NOTE — Progress Notes (Signed)
? ? ?Patient ID: Patrick [name], male    DOB: 05/17/1961  MRN: [id number] ? ?CC: Hypertension Follow-Up  ? ?Subjective: ?Patrick [name] is a 61 y.o. male who presents for hypertension follow-up.  ? ?His concerns today include:  ?HYPERTENSIN FOLLOW-UP: ?12/02/2021: ?- Continue Losartan-Hydrochlorothiazide as prescribed.  ? ?02/28/2022: ?Doing well on current regimen. No side effects. No issues/concerns. Denies chest pain and shortness of breath. Home blood pressures 110's-120's/70's-80's.  ? ?Patient Active Problem List  ? Diagnosis Date Noted  ? Elevated prostate specific antigen (PSA) 01/18/2022  ? Generalized anxiety disorder 05/05/2021  ? Essential hypertension 04/21/2021  ? 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 Cirrhosis of liver (HCC) 03/14/2014  ? Colon cancer screening 03/14/2014  ? Hypotension, unspecified 03/01/2014  ? DKA (diabetic ketoacidoses) 02/28/2014  ? Hyponatremia 02/28/2014  ? Hepatic encephalopathy (HCC) 08/24/2012  ? Anemia 08/23/2012  ? Malnutrition (HCC) 08/23/2012  ? Acute renal failure (HCC) 08/23/2012  ? Thrombocytopenia (HCC) 08/23/2012  ? Coagulopathy (HCC) 08/23/2012  ? Ascites 08/22/2012  ? 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  ?  ? ?Current Outpatient Medications on File Prior to Visit  ?Medication Sig Dispense Refill  ? ACCU-CHEK AVIVA PLUS test strip 1 EACH BY OTHER ROUTE 4 (FOUR) TIMES DAILY 100 strip 13  ? B-D UF III MINI PEN NEEDLES 31G X 5 MM MISC USE 3 TIMES DAILY 300 each 0  ? Blood Pressure Monitor DEVI Use as directed to check home blood pressure at least 4 times a week. Please notify primary provider of blood pressures greater than 140/90. Please send prescription to [facility].  ?Phone Number [phone] ?Fax Number [phone] 1 each 0  ? cycloSPORINE (RESTASIS) 0.05 % ophthalmic emulsion Place 1 drop into both eyes 2 (two) times daily.    ? gabapentin (NEURONTIN) 300 MG capsule TAKE 1 CAPSULE BY MOUTH EVERYDAY AT BEDTIME 30  capsule 1  ? insulin aspart (NOVOLOG FLEXPEN) 100 UNIT/ML FlexPen 4 times a day (just before each meal): 6-6-4-2 units 30 mL 3  ? insulin glargine (LANTUS SOLOSTAR) 100 UNIT/ML Solostar Pen Inject 5 Units into the skin at bedtime. 15 mL PRN  ? latanoprost (XALATAN) 0.005 % ophthalmic solution SMARTSIG:1 Drop(s) In Eye(s) Every Evening    ? Multiple Vitamin (MULTIVITAMIN WITH MINERALS) TABS tablet Take 1 tablet by mouth daily.    ? sildenafil (VIAGRA) 25 MG tablet Take 1 tablet 1/2 hour to 1 hour prior to intercourse as needed. Limit use to 1/2 tablet or 1 tablet per 24 hours. 20 tablet 0  ? ?No current facility-administered medications on file prior to visit.  ? ? ?No Known Allergies ? ?Social History  ? ?Socioeconomic History  ? Marital status: Single  ?  Spouse name: Not on file  ? Number of children: 1  ? Years of education: Not on file  ? Highest education level: Not on file  ?Occupational History  ? Occupation: unemployed  ?Tobacco Use  ? Smoking status: Former  ?  Packs/day: 0.00  ?  Years: 15.00  ?  Pack years: 0.00  ?  Types: Cigarettes  ?  Quit date: 07/14/2010  ?  Years since quitting: 11.6  ?  Passive exposure: Past  ? Smokeless tobacco: Never  ?Substance and Sexual Activity  ? Alcohol use: No  ?  Comment: Not using for last 11 years  ? Drug use: No  ? Sexual activity: Not on file  ?Other Topics Concern  ? Not on file  ?Social  History Narrative  ? Not on file  ? ?Social Determinants of Health  ? ?Financial Resource Strain: Low Risk   ? Difficulty of Paying Living Expenses: Not very hard  ?Food Insecurity: No Food Insecurity  ? Worried About Programme researcher, broadcasting/film/video in the Last Year: Never true  ? Ran Out of Food in the Last Year: Never true  ?Transportation Needs: No Transportation Needs  ? Lack of Transportation (Medical): No  ? Lack of Transportation (Non-Medical): No  ?Physical Activity: Inactive  ? Days of Exercise per Week: 0 days  ? Minutes of Exercise per Session: 0 min  ?Stress: Stress Concern Present   ? Feeling of Stress : To some extent  ?Social Connections: Unknown  ? Frequency of Communication with Friends and Family: More than three times a week  ? Frequency of Social Gatherings with Friends and Family: More than three times a week  ? Attends Religious Services: Not on file  ? Active Member of Clubs or Organizations: No  ? Attends Banker Meetings: Never  ? Marital Status: Living with partner  ?Intimate Partner Violence: Not on file  ? ? ?Family History  ?Problem Relation Age of Onset  ? Heart disease Mother   ?     bi-pass  ? Hypertension Mother   ? Cancer Brother   ?     unsure type  ? Hypertension Father   ? Colon cancer Neg Hx   ? ? ?Past Surgical History:  ?Procedure Laterality Date  ? EYE SURGERY N/A   ? [employer] 02/21/2021  ? MOUTH SURGERY    ? on gums  ? TONSILLECTOMY    ? ? ?ROS: ?Review of Systems ?Negative except as stated above ? ?PHYSICAL EXAM: ?BP (!) 143/81   Pulse 83   Temp 98.3 ?F (36.8 ?C)   Resp 18   Ht 5\' 10"  (1.778 m)   Wt 176 lb (79.8 kg)   SpO2 98%   BMI 25.25 kg/m?  ? ?Physical Exam ?HENT:  ?   Head: Normocephalic and atraumatic.  ?Eyes:  ?   Extraocular Movements: Extraocular movements intact.  ?   Conjunctiva/sclera: Conjunctivae normal.  ?   Pupils: Pupils are equal, round, and reactive to light.  ?Cardiovascular:  ?   Rate and Rhythm: Normal rate and regular rhythm.  ?   Pulses: Normal pulses.  ?   Heart sounds: Normal heart sounds.  ?Pulmonary:  ?   Effort: Pulmonary effort is normal.  ?   Breath sounds: Normal breath sounds.  ?Musculoskeletal:  ?   Cervical back: Normal range of motion and neck supple.  ?Neurological:  ?   General: No focal deficit present.  ?   Mental Status: He is alert and oriented to person, place, and time.  ?Psychiatric:     ?   Mood and Affect: Mood normal.     ?   Behavior: Behavior normal.  ? ?ASSESSMENT AND PLAN: ?1. Essential (primary) hypertension: ?- Home blood pressures at goal.  ?- Continue Losartan-Hydrochlorothiazide as  prescribed.  ?- Counseled on blood pressure goal of less than 130/80, low-sodium, DASH diet, medication compliance, and 150 minutes of moderate intensity exercise per week as tolerated. Counseled on medication adherence and adverse effects. ?- Follow-up with primary provider in 3 months or sooner if needed.  ?- losartan-hydrochlorothiazide (HYZAAR) 100-25 MG tablet; Take 1 tablet by mouth daily.  Dispense: 90 tablet; Refill: 0 ? ? ? ?Patient was given the opportunity to ask questions.  Patient verbalized  understanding of the plan and was able to repeat key elements of the plan. Patient was given clear instructions to go to Emergency Department or return to medical center if symptoms don't improve, worsen, or new problems develop.The patient verbalized understanding. ? ? ?Requested Prescriptions  ? ?Signed Prescriptions Disp Refills  ? losartan-hydrochlorothiazide (HYZAAR) 100-25 MG tablet 90 tablet 0  ?  Sig: Take 1 tablet by mouth daily.  ? ? ?Return in about 3 months (around 05/30/2022) for Follow-Up or next available hypertension . ? ?[name], NP  ?

## 2022-02-28 ENCOUNTER — Encounter: Payer: Self-pay | Admitting: Family

## 2022-02-28 ENCOUNTER — Ambulatory Visit (INDEPENDENT_AMBULATORY_CARE_PROVIDER_SITE_OTHER): Payer: Medicaid Other | Admitting: Family

## 2022-02-28 VITALS — BP 143/81 | HR 83 | Temp 98.3°F | Resp 18 | Ht 70.0 in | Wt 176.0 lb

## 2022-02-28 DIAGNOSIS — I10 Essential (primary) hypertension: Secondary | ICD-10-CM | POA: Diagnosis not present

## 2022-02-28 MED ORDER — LOSARTAN POTASSIUM-HCTZ 100-25 MG PO TABS: 1.0000 | ORAL_TABLET | Freq: Every day | ORAL | 0 refills | Status: DC

## 2022-02-28 NOTE — Progress Notes (Signed)
Pt presents for hypertension follow-up  ?

## 2022-03-03 ENCOUNTER — Telehealth: Payer: Self-pay | Admitting: Family

## 2022-03-03 NOTE — Telephone Encounter (Signed)
.. ?  Medicaid Managed Care  ? ?Unsuccessful Outreach Note ? ?03/03/2022 ?Name: CORDIE BUENING MRN: [id number] DOB: 1960/12/11 ? ?Referred by: [name], NP ?Reason for referral : High Risk Managed Medicaid (I called the patient today to get him rescheduled with the MM RNCM. I left my name and number on his VM.) ? ? ?Third unsuccessful telephone outreach was attempted today. The patient was referred to the case management team for assistance with care management and care coordination. The patient's primary care provider has been notified of our unsuccessful attempts to make or maintain contact with the patient. The care management team is pleased to engage with this patient at any time in the future should he/she be interested in assistance from the care management team.  ? ?Follow Up Plan: We have been unable to make contact with the patient for follow up. The care management team is available to follow up with the patient after provider conversation with the patient regarding recommendation for care management engagement and subsequent re-referral to the care management team.  ? ? ?Weston Settle ?Care Guide, High Risk Medicaid Managed Care ?Embedded Care Coordination ?[place]  [facility]  ? ? ?SIGNATURE  ?

## 2022-03-10 ENCOUNTER — Telehealth: Payer: Self-pay

## 2022-03-10 ENCOUNTER — Other Ambulatory Visit: Payer: Self-pay

## 2022-03-10 NOTE — Patient Instructions (Signed)
Patrick [name] ,  ? ?The [facility] Managed Care Team is available to provide assistance to you with your healthcare needs at no cost and as a benefit of your [facility] Health plan.  ? ?This is to notify you of the [facility] High Risk Managed Medicaid Case Closure effective 03/10/2022 because we have been unable to maintain contact with you.  We have been unable to reach you on 3 separate attempts. The care management team is available to assist with your healthcare needs at any time. Please do not hesitate to contact me at the number below.    ? ?Thank you,  ? ?Virgina Norfolk RN, BSN ?Community Care Coordinator ?[place]  [facility] ?Mobile: [phone]   ?

## 2022-03-10 NOTE — Patient Instructions (Signed)
Patrick [name] ,  ? ?The [facility] Managed Care Team is available to provide assistance to you with your healthcare needs at no cost and as a benefit of your [facility] Health plan.  ? ?This Medical illustrator received a telephone call from patient today stating that he would like to remain in the [facility]  [facility]) High Risk Managed Medicaid program.  I informed him that I closed the case today because of not being able to maintain telephone contact with him.   ?Patient and this RN Care Manager are in agreement to continue services of the [facility] High Risk Managed Medicaid team at this time. ? ?Plan: RN Care Manager telephone visit on Tuesday 4/25.2023 at 11:30 am. ? ?Thank you,  ? ?Virgina Norfolk RN, BSN ?Community Care Coordinator ?[place]  [facility] ?Mobile: [phone]   ?

## 2022-03-10 NOTE — Patient Outreach (Signed)
Care Coordination ? ?03/10/2022 ? ?Delvecchio B Perkinson ?04-27-1961 ?[id number] ? ?This Consulting civil engineer received a telephone call from patient stating that he would like to remain in the [place]  [facility]) [place] Medicaid program.  I informed him that I closed the case today because of not being able to maintain telephone contact with him.   ?Patient and this RN Care Manager are in agreement to continue services of the [facility] High Risk Managed Medicaid team at this time. ? ?Plan: RN Care Manager telephone visit on Tuesday 4/25.2023 at 11:30 am. ? ?Salvatore Marvel RN, BSN ?Community Care Coordinator ?[place] [facility] ?Mobile: [phone]  ?

## 2022-03-10 NOTE — Patient Outreach (Signed)
Care Coordination ? ?03/10/2022 ? ?Patrick [name] ?08-07-1961 ?[id number] ? ?The [facility] Managed Care Team is available to provide assistance to you with your healthcare needs at no cost and as a benefit of your [facility] Health plan.  ?  ?This is to notify you of the [place] Medicaid Case Closure effective 03/10/2022 because we have been unable to maintain contact with you.  We have been unable to reach you on 3 separate attempts.  ? ?The care management team is available to assist with your healthcare needs at any time. Please do not hesitate to contact me at the number below.   ? ?Salvatore Marvel RN, BSN ?Community Care Coordinator ?[place] [facility] ?Mobile: [phone]  ?

## 2022-03-15 ENCOUNTER — Other Ambulatory Visit: Payer: Self-pay

## 2022-03-15 NOTE — Patient Outreach (Signed)
Medicaid Managed Care   Nurse Care Manager Note  03/15/2022 Name:  Patrick [name] MRN:  [id number] DOB:  May 18, 1961  Patrick [name] is an 61 y.o. year old male who is a primary patient of Patrick Fendt, NP.  The [facility] Managed Care Coordination team was consulted for assistance with:    HTN DM I Prostate Issues  Patrick [name] was given information about Medicaid Managed Care Coordination team services today. Patrick [name] Patient agreed to services and verbal consent obtained.  Engaged with patient by telephone for follow up visit in response to provider referral for case management and/or care coordination services.   Assessments/Interventions:  Review of past medical history, allergies, medications, health status, including review of consultants reports, laboratory and other test data, was performed as part of comprehensive evaluation and provision of chronic care management services.  SDOH (Social Determinants of Health) assessments and interventions performed:   Care Plan  No Known Allergies  Medications Reviewed Today     Reviewed by Leane Call, RN (Case Manager) on 03/15/22 at 1202  Med List Status: <None>   Medication Order Taking? Sig Documenting Provider Last Dose Status Informant  ACCU-CHEK AVIVA PLUS test strip [id number]  1 EACH BY OTHER ROUTE 4 (FOUR) TIMES DAILY Romero Belling, MD  Active   B-D UF III MINI PEN NEEDLES 31G X 5 MM MISC [id number]  USE 3 TIMES DAILY Romero Belling, MD  Active   Blood Pressure Monitor DEVI [id number] No Use as directed to check home blood pressure at least 4 times a week. Please notify primary provider of blood pressures greater than 140/90. Please send prescription to [facility].  Phone Number [phone] Fax Number [phone] Patrick Fendt, NP Taking Active   cycloSPORINE (RESTASIS) 0.05 % ophthalmic emulsion [id number] No Place 1 drop into both eyes 2 (two) times daily. Patrick Fendt, NP Taking Active Self  gabapentin  (NEURONTIN) 300 MG capsule [id number]  TAKE 1 CAPSULE BY MOUTH EVERYDAY AT BEDTIME Patrick Fendt, NP  Active   insulin aspart (NOVOLOG FLEXPEN) 100 UNIT/ML FlexPen [id number]  4 times a day (just before each meal): 6-6-4-2 units Romero Belling, MD  Active   insulin glargine (LANTUS SOLOSTAR) 100 UNIT/ML Solostar Pen [id number] No Inject 5 Units into the skin at bedtime. Romero Belling, MD Taking Active   latanoprost (XALATAN) 0.005 % ophthalmic solution [id number] No SMARTSIG:1 Drop(s) In Eye(s) Every Evening [provider] Taking Active   losartan-hydrochlorothiazide (HYZAAR) 100-25 MG tablet [id number]  Take 1 tablet by mouth daily. Patrick Fendt, NP  Active   Multiple Vitamin (MULTIVITAMIN WITH MINERALS) TABS tablet [id number] No Take 1 tablet by mouth daily. [provider] Taking Active Self  sildenafil (VIAGRA) 25 MG tablet [id number] No Take 1 tablet 1/2 hour to 1 hour prior to intercourse as needed. Limit use to 1/2 tablet or 1 tablet per 24 hours. 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) 03/22/2014   Cirrhosis of liver (HCC) 03/14/2014   Colon cancer screening 03/14/2014   Hypotension, unspecified 03/01/2014   DKA (diabetic ketoacidoses) 02/28/2014   Hyponatremia 02/28/2014   Hepatic encephalopathy (HCC) 08/24/2012   Anemia 08/23/2012   Malnutrition (HCC) 08/23/2012   Acute renal  failure (HCC) 08/23/2012   Thrombocytopenia (HCC) 08/23/2012   Coagulopathy (HCC) 08/23/2012   Ascites 08/22/2012   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    Conditions to be addressed/monitored per PCP order:  HTN and DM I and Prostate Issues  Care Plan : [facility] Plan of Care  Updates made by Leane Call, RN since 03/15/2022  12:00 AM     Problem: Chronic Disease Management & Care Coordination Needs for DMI & HTN      Long-Range Goal: Development of Plan of Care for Chronic Disease Management & Care Coordination Needs   Start Date: 07/14/2021  Expected End Date: 05/20/2022  Priority: High  Note:   Current Barriers:  Knowledge Deficits related to plan of care for management of HTN and DM I and Prostate Issues Chronic Disease Management support and education needs related to HTN and DM I and Prostate Issues  RNCM Clinical Goal(s):  Patient will verbalize understanding of plan for management of HTN and DM I and Prostate Issues as evidenced by good management of these chronic diseases verbalize basic understanding of HTN and DM I and Prostate Issues disease process and self health management plan as evidenced by good management of these chronic diseases take all medications exactly as prescribed and will call provider for medication related questions as evidenced by patient being medication compliant.    attend all scheduled medical appointments: 03/17/2022 Dr Everardo All; 05/30/2022 PCP Office Visit as evidenced by          demonstrate ongoing adherence to prescribed treatment plan for HTN and DM I and Prostate Issues as evidenced by noted improvement of chronic disease managment of these diseases. demonstrate ongoing health management independence as evidenced by good maintenance of HTN, DM I  and Prostate Issues.        continue to work with Medical illustrator and/or Social Worker to address care management and care coordination needs related to HTN and DM I and Prostate Issues as evidenced by adherence to CM Team Scheduled appointments     work with [employer] care guide to address needs related to [employer] knowledge of community resource: Radiographer, therapeutic and Applying for Disability as evidenced by patient and/or community resource care guide support    through collaboration with Medical illustrator, provider, and care  team.   Interventions: Inter-disciplinary care team collaboration (see longitudinal plan of care) Evaluation of current treatment plan related to  self management and patient's adherence to plan as established by provider   Diabetes:  (Status: Goal on Track (progressing): YES.) Lab Results  Component Value Date   HGBA1C 7.6 (A) 12/16/2021    Assessed patient's understanding of A1c goal: <7% Provided education to patient about basic DM disease process; Reviewed medications with patient and discussed importance of medication adherence;        Counseled on importance of regular laboratory monitoring as prescribed;        Discussed plans with patient for ongoing care management follow up and provided patient with direct contact information for care management team;      Reviewed patient's knowledge of signs & symptoms of hypo and hyperglycemia and importance of correct treatment;       Reviewed scheduled/upcoming provider appointments including: Endocrinologist on 03/17/2022; PCP office visit on 05/30/2022        Continue education to patient regarding importance and rationale for checking blood sugars 3 times/day and record.  Patient has continuous blood sugar monitor Dexcom for last 2 - 3 months  and is knowledgeable about using it.   Patient reports his blood sugar range is between 110 - 180 throughout the day and can occasionally be higher into 200 range during the night.  He verbalizes understanding that elevated blood sugar is related to what he has eaten at dinner and occasional bedtime snack.   Follow up on referral made to BSW for assistance with finding dental group that works with medicaid patients to repair dentures and have added information regarding applying for disability;      Review of patient status, including review of consultants reports, relevant laboratory and other test results, and medications completed;          Hypertension: (Status: Goal on Track (progressing):  YES.) Last practice recorded BP readings:  BP Readings from Last 3 Encounters:  02/28/22 (!) 143/81  12/16/21 (!) 160/80  12/02/21 126/76     Most recent eGFR/CrCl:  Lab Results  Component Value Date   EGFR 65 06/02/2021    No components found for: CRCL  Evaluation of current treatment plan related to hypertension self management and patient's adherence to plan as established by provider;   Reviewed prescribed diet Low Sodium Reviewed medications with patient and discussed importance of compliance;  Counseled on the importance of exercise goals with target of 150 minutes per week Discussed plans with patient for ongoing care management follow up and provided patient with direct contact information for care management team; Advised patient, providing education and rationale, to monitor blood pressure daily and record, calling PCP for findings outside established parameters;  Reviewed scheduled/upcoming provider appointments including:  Patient received blood pressure monitor for home use.  Patient checks blood pressure everyday.  Patient reports blood pressure range at home is 108 - 117/80's.  This RN Care Manager provided education that patient's blood pressure may be slightly elevated at PCP office visit which is completely normal due to ' white coat syndrome' and home blood pressure readings are important for PCP to see at office visit.  Instructed patient to bring his home blood pressure monitor with him to PCP office visits.   Prostate Issues:  (Status:  New goal.)  Long Term Goal Evaluation of current treatment plan related to  Prostate Issues with elevated PSA level of 12.18 one month ago. , Lacks knowledge of community resource: dental resources for broken dentures and information regarding applying for disability,  self-management and patient's adherence to plan as established by provider. Discussed plans with patient for ongoing care management follow up and provided patient with  direct contact information for care management team Evaluation of current treatment plan related to elevated PSA level to 12.18 and patient's adherence to plan as established by provider Collaborated with [facility] and Nephrology office regarding moving forward with ordered MRI referral for Prostate. Reviewed scheduled/upcoming provider appointments including Social Work referral for dental resources for broken denture and applying for disability. Discussed plans with patient for ongoing care management follow up and provided patient with direct contact information for care management team   Patient Goals/Self-Care Activities: Patient will self administer medications as prescribed Patient will attend all scheduled provider appointments Patient will call pharmacy for medication refills Patient will continue to perform ADL's independently Patient will continue to perform IADL's independently Patient will call provider office for new concerns or questions       Follow Up:  Patient agrees to Care Plan and Follow-up.  Plan: The Managed Medicaid care management team will reach out to the patient again over the  next 30 days.  Date/time of next scheduled RN care management/care coordination outreach:  Apr 12, 2022 at 10:30 am  Virgina Norfolk RN, BSN [facility] Coordinator [facility]  [facility] Mobile: [phone]

## 2022-03-15 NOTE — Patient Instructions (Signed)
Visit Information ? ?Mr. Pesola was given information about Medicaid Managed Care team care coordination services as a part of their [facility] Medicaid benefit. [name] verbally consented to engagement with the [facility] Managed Care team.  ? ?If you are experiencing a medical emergency, please call 911 or report to your local emergency department or urgent care.  ? ?If you have a non-emergency medical problem during routine business hours, please contact your provider's office and ask to speak with a nurse.  ? ?For questions related to your [facility] health plan, please call: [phone] or go here:[email or web address] ? ?If you would like to schedule transportation through your [facility] plan, please call the following number at least 2 days in advance of your appointment: [phone]. ? You can also use the MTM portal or MTM mobile app to manage your rides. For the portal, please go to [email or web address]. ? ?Call the [place] at [phone], at any time, 24 hours a day, 7 days a week. If you are in danger or need immediate medical attention call 911. ? ?If you would like help to quit smoking, call 1-800-QUIT-NOW 484 669 9695) OR Espa?ol: 1-855-D?jelo-Ya (318)196-6580) o para m?s informaci?n haga clic aqu? or Text READY to 200-400 to register via text ? ?Mr. Ronnald Ramp - following are the goals we discussed in your visit today:  ? Goals Addressed   ? ?  ?  ?  ?  ? This Visit's Progress  ?  RNCM - Prostate Issues     ?  Timeframe:  Long-Range Goal ?Priority:  High ?Start Date:  03/15/2022                        ?Expected End Date:  05/20/2022 ? ?  Patient Goals/Self-Care Activities: ?Patient will self administer medications as prescribed ?Patient will attend all scheduled provider appointments ?Patient will call pharmacy for medication refills ?Patient will continue to perform ADL's independently ?Patient will continue to perform IADL's independently ?Patient will  call provider office for new concerns or questions    ?Patient will receive ordered MRI and continue to monitor PSA levels.    ?  ?  RNCM: Glycemic Management     ?  Timeframe:  Long-Range Goal ?Priority:  High ?Start Date:   07/14/2021                          ?Expected End Date:  05/20/2022 ? ?  Patient Goals/Self-Care Activities: ?Patient will self administer medications as prescribed ?Patient will attend all scheduled provider appointments ?Patient will call pharmacy for medication refills ?Patient will continue to perform ADL's independently ?Patient will continue to perform IADL's independently ?Patient will call provider office for new concerns or questions    ?Patient will monitor daily blood sugars using a Dexacom monitor           ?  ?  RNCM: Hypertension Monitoring & Management     ?  Timeframe:  Long-Range Goal ?Priority:  High ?Start Date: 07/14/2021                         ?Expected End Date: 05/20/2022     ? ?  Patient Goals/Self-Care Activities: ?Patient will self administer medications as prescribed ?Patient will attend all scheduled provider appointments ?Patient will call pharmacy for medication refills ?Patient will continue to perform ADL's independently ?Patient will continue to perform IADL's independently ?Patient will call  provider office for new concerns or questions   ?Patient will monitor blood pressure 2 to 3 times per week and record.           ?  ? ?  ? ? ?Please see education materials related to today's visit provided by MyChart link. ? ?Patient verbalizes understanding of instructions and care plan provided today and agrees to view in [place]. Active MyChart status confirmed with patient.   ? ?The Managed Medicaid care management team will reach out to the patient again over the next 30 days.  ? ?Salvatore Marvel RN, BSN ?Community Care Coordinator ?[facility] ?Mobile: [phone]  ? ?Following is a copy of your plan of care:  ?Care Plan : [place]   ?Updates made by Inge Rise, RN since 03/15/2022 12:00 AM  ?  ? 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  ? ?Long-Range Goal: Development of Plan of Care for Chronic Disease Management & Care Coordination Needs   ?Start Date: 07/14/2021  ?Expected End Date: 05/20/2022  ?Priority: High  ?Note:   ?Current Barriers:  ?Knowledge Deficits related to plan of care for management of HTN and DM I and Prostate Issues ?Chronic Disease Management support and education needs related to HTN and DM I and Prostate Issues ? ?RNCM Clinical Goal(s):  ?Patient will verbalize understanding of plan for management of HTN and DM I and Prostate Issues as evidenced by good management of these chronic diseases ?verbalize basic understanding of HTN and DM I and Prostate Issues disease process and self health management plan as evidenced by good management of these chronic diseases ?take all medications exactly as prescribed and will call provider for medication related questions as evidenced by patient being medication compliant.    ?attend all scheduled medical appointments: 03/17/2022 Dr Loanne Drilling; 05/30/2022 PCP Office Visit as evidenced by          ?demonstrate ongoing adherence to prescribed treatment plan for HTN and DM I and Prostate Issues as evidenced by noted improvement of chronic disease managment of these diseases. ?demonstrate ongoing health management independence as evidenced by good maintenance of HTN, DM I  and Prostate Issues.        ?continue to work with Consulting civil engineer and/or Social Worker to address care management and care coordination needs related to HTN and DM I and Prostate Issues as evidenced by adherence to CM Team Scheduled appointments     ?work with community resource care guide to address needs related to [employer] knowledge of community resource: Corporate investment banker and Applying for Disability as evidenced by patient and/or community resource care guide support    through  collaboration with Consulting civil engineer, provider, and care team.  ? ?Interventions: ?Inter-disciplinary care team collaboration (see longitudinal plan of care) ?Evaluation of current treatment plan related to  self management and patient's adherence to plan as established by provider ? ? ?Diabetes:  (Status: Goal on Track (progressing): YES.) ?Lab Results  ?Component Value Date  ? HGBA1C 7.6 (A) 12/16/2021  ?  ?Assessed patient's understanding of A1c goal: <7% ?Provided education to patient about basic DM disease process; ?Reviewed medications with patient and discussed importance of medication adherence;        ?Counseled on importance of regular laboratory monitoring as prescribed;        ?Discussed plans with patient for ongoing care management follow up and provided patient with direct contact information for care  management team;      ?Reviewed patient's knowledge of signs & symptoms of hypo and hyperglycemia and importance of correct treatment;       ?Reviewed scheduled/upcoming provider appointments including: Endocrinologist on 03/17/2022; PCP office visit on 05/30/2022        ?Continue education to patient regarding importance and rationale for checking blood sugars 3 times/day and record.  Patient has continuous blood sugar monitor Dexcom for last 2 - 3 months and is knowledgeable about using it.   ?Patient reports his blood sugar range is between 110 - 180 throughout the day and can occasionally be higher into 200 range during the night.  He verbalizes understanding that elevated blood sugar is related to what he has eaten at dinner and occasional bedtime snack.   ?Follow up on referral made to BSW for assistance with finding dental group that works with medicaid patients to repair dentures and have added information regarding applying for disability;      ?Review of patient status, including review of consultants reports, relevant laboratory and other test results, and medications completed;   ?       ? ?Hypertension: (Status: Goal on Track (progressing): YES.) ?Last practice recorded BP readings:  ?BP Readings from Last 3 Encounters:  ?02/28/22 (!) 143/81  ?12/16/21 (!) 160/80  ?12/02/21 126/76  ?  ? ?Most r

## 2022-03-17 ENCOUNTER — Encounter: Payer: Self-pay | Admitting: Endocrinology

## 2022-03-17 ENCOUNTER — Ambulatory Visit (INDEPENDENT_AMBULATORY_CARE_PROVIDER_SITE_OTHER): Payer: Medicaid Other | Admitting: Endocrinology

## 2022-03-17 VITALS — BP 130/80 | HR 84 | Ht 70.0 in | Wt 180.6 lb

## 2022-03-17 DIAGNOSIS — E1042 Type 1 diabetes mellitus with diabetic polyneuropathy: Secondary | ICD-10-CM

## 2022-03-17 LAB — POCT GLYCOSYLATED HEMOGLOBIN (HGB A1C): Hemoglobin A1C: 8.1 % — AB (ref 4.0–5.6)

## 2022-03-17 MED ORDER — LANTUS SOLOSTAR 100 UNIT/ML ~~LOC~~ SOPN: 8.0000 [IU] | PEN_INJECTOR | Freq: Every day | SUBCUTANEOUS | 99 refills | Status: DC

## 2022-03-17 NOTE — Patient Instructions (Addendum)
Your blood pressure is high today.  Please see your primary care provider soon, to have it rechecked.   ?check your blood sugar 4 times a day.  vary the time of day when you check, between before the 3 meals, and at bedtime.  also check if you have symptoms of your blood sugar being too high or too low.  please keep a record of the readings and bring it to your next appointment here (or you can bring the meter itself).  You can write it on any piece of paper.  please call us sooner if your blood sugar goes below 70, or if you have a lot of readings over 200.   ?I have sent a prescription to your pharmacy, to increase the Lantus to 8 units at bedtime. ?Please continue the same Novolog.    ?I agree with your plan for the pump. ?You should have an endocrinology follow-up appointment in 1-2 months.   ? ?

## 2022-03-17 NOTE — Progress Notes (Signed)
? ?Subjective:  ? ? Patient ID: Patrick [name], male    DOB: 02/23/1961, 61 y.o.   MRN: [id number] ? ?HPI ?Pt returns for f/u of diabetes mellitus:   ?DM type: 1, vs chronic pancreatitis ?Dx'ed: 2015.  ?Complications: PPN and stage 3 CRI ?Therapy: insulin since 2020 ?DKA: only once, at time of dx.  ?Severe hypoglycemia: never.  ?Pancreatitis: never.  ?Pancreatic imaging (2007): pancreatic Ca++ ?Other: in 2017, he went into partial remission; he was off insulin 2018-2020; he stopped drinking EtOH in 2010; he takes multiple daily injections; he has dexcom G6 CGM; he eats meals at 8AM, 1PM, and 7PM.  He also eats in the evening.    ?Interval history: I reviewed continuous glucose monitor data.  Glucose varies from 105-240.  It is in general highest at 4AM.  It increases overnight, but there is little trend throughout the day.  He takes insulin as rx'ed. ?He is working towards pump rx. He has mild hypoglycemia 2/month.  This happens with activity.  ?Past Medical History:  ?Diagnosis Date  ? Cirrhosis (HCC)   ? Diabetes (HCC)   ? type 1  ? Diabetes mellitus without complication (HCC)   ? Phreesia 02/21/2021  ? ETOH abuse   ? ? ?Past Surgical History:  ?Procedure Laterality Date  ? EYE SURGERY N/A   ? Phreesia 02/21/2021  ? MOUTH SURGERY    ? on gums  ? TONSILLECTOMY    ? ? ?Social History  ? ?Socioeconomic History  ? Marital status: Single  ?  Spouse name: Not on file  ? Number of children: 1  ? Years of education: Not on file  ? Highest education level: Not on file  ?Occupational History  ? Occupation: unemployed  ?Tobacco Use  ? Smoking status: Former  ?  Packs/day: 0.00  ?  Years: 15.00  ?  Pack years: 0.00  ?  Types: Cigarettes  ?  Quit date: 07/14/2010  ?  Years since quitting: 11.6  ?  Passive exposure: Past  ? Smokeless tobacco: Never  ?Substance and Sexual Activity  ? Alcohol use: No  ?  Comment: Not using for last 11 years  ? Drug use: No  ? Sexual activity: Not on file  ?Other Topics Concern  ? Not on file   ?Social History Narrative  ? Not on file  ? ?Social Determinants of Health  ? ?Financial Resource Strain: Low Risk   ? Difficulty of Paying Living Expenses: Not very hard  ?Food Insecurity: No Food Insecurity  ? Worried About Programme researcher, broadcasting/film/video in the Last Year: Never true  ? Ran Out of Food in the Last Year: Never true  ?Transportation Needs: No Transportation Needs  ? Lack of Transportation (Medical): No  ? Lack of Transportation (Non-Medical): No  ?Physical Activity: Inactive  ? Days of Exercise per Week: 0 days  ? Minutes of Exercise per Session: 0 min  ?Stress: Stress Concern Present  ? Feeling of Stress : To some extent  ?Social Connections: Unknown  ? Frequency of Communication with Friends and Family: More than three times a week  ? Frequency of Social Gatherings with Friends and Family: More than three times a week  ? Attends Religious Services: Not on file  ? Active Member of Clubs or Organizations: No  ? Attends Banker Meetings: Never  ? Marital Status: Living with partner  ?Intimate Partner Violence: Not on file  ? ? ?Current Outpatient Medications on File Prior to Visit  ?  Medication Sig Dispense Refill  ? ACCU-CHEK AVIVA PLUS test strip 1 EACH BY OTHER ROUTE 4 (FOUR) TIMES DAILY 100 strip 13  ? B-D UF III MINI PEN NEEDLES 31G X 5 MM MISC USE 3 TIMES DAILY 300 each 0  ? Blood Pressure Monitor DEVI Use as directed to check home blood pressure at least 4 times a week. Please notify primary provider of blood pressures greater than 140/90. Please send prescription to [facility].  ?Phone Number [phone] ?Fax Number [phone] 1 each 0  ? cycloSPORINE (RESTASIS) 0.05 % ophthalmic emulsion Place 1 drop into both eyes 2 (two) times daily.    ? gabapentin (NEURONTIN) 300 MG capsule TAKE 1 CAPSULE BY MOUTH EVERYDAY AT BEDTIME 30 capsule 1  ? insulin aspart (NOVOLOG FLEXPEN) 100 UNIT/ML FlexPen 4 times a day (just before each meal): 6-6-4-2 units 30 mL 3  ? latanoprost (XALATAN) 0.005 %  ophthalmic solution SMARTSIG:1 Drop(s) In Eye(s) Every Evening    ? losartan-hydrochlorothiazide (HYZAAR) 100-25 MG tablet Take 1 tablet by mouth daily. 90 tablet 0  ? Multiple Vitamin (MULTIVITAMIN WITH MINERALS) TABS tablet Take 1 tablet by mouth daily.    ? sildenafil (VIAGRA) 25 MG tablet Take 1 tablet 1/2 hour to 1 hour prior to intercourse as needed. Limit use to 1/2 tablet or 1 tablet per 24 hours. 20 tablet 0  ? ?No current facility-administered medications on file prior to visit.  ? ? ?No Known Allergies ? ?Family History  ?Problem Relation Age of Onset  ? Heart disease Mother   ?     bi-pass  ? Hypertension Mother   ? Cancer Brother   ?     unsure type  ? Hypertension Father   ? Colon cancer Neg Hx   ? ? ?BP 130/80 (BP Location: Left Arm, Patient Position: Sitting, Cuff Size: Normal)   Pulse 84   Ht 5\' 10"  (1.778 m)   Wt 180 lb 9.6 oz (81.9 kg)   SpO2 99%   BMI 25.91 kg/m?  ? ?Review of Systems ? ?   ?Objective:  ? Physical Exam ?VITAL SIGNS:  See vs page ?GENERAL: no distress.   ? ? ?Lab Results  ?Component Value Date  ? HGBA1C 8.1 (A) 03/17/2022  ? ?   ?Assessment & Plan:  ?Type 1 DM: uncontrolled ? ? ?Patient Instructions  ?Your blood pressure is high today.  Please see your primary care provider soon, to have it rechecked.   ?check your blood sugar 4 times a day.  vary the time of day when you check, between before the 3 meals, and at bedtime.  also check if you have symptoms of your blood sugar being too high or too low.  please keep a record of the readings and bring it to your next appointment here (or you can bring the meter itself).  You can write it on any piece of paper.  please call 03/19/2022 sooner if your blood sugar goes below 70, or if you have a lot of readings over 200.   ?I have sent a prescription to your pharmacy, to increase the Lantus to 8 units at bedtime. ?Please continue the same Novolog.    ?I agree with your plan for the pump. ?You should have an endocrinology follow-up  appointment in 1-2 months.   ? ? ? ?

## 2022-03-22 ENCOUNTER — Other Ambulatory Visit: Payer: Self-pay

## 2022-03-22 NOTE — Patient Outreach (Signed)
?Medicaid Managed Care ?Social Work Note ? ?03/22/2022 ?Name:  Patrick [name] MRN:  [id number] DOB:  09-Jul-1961 ? ?Patrick [name] is an 61 y.o. year old male who is a primary patient of [name], NP.  The [facility] Managed Care Coordination team was consulted for assistance with:  Community Resources  ? ?Patrick [name] was given information about Medicaid Managed Care Coordination team services today. [name] Patient agreed to services and verbal consent obtained. ? ?Engaged with patient  for by telephone forinitial visit in response to referral for case management and/or care coordination services.  ? ?Assessments/Interventions:  Review of past medical history, allergies, medications, health status, including review of consultants reports, laboratory and other test data, was performed as part of comprehensive evaluation and provision of chronic care management services. ? ?SDOH: (Social Determinant of Health) assessments and interventions performed: ?03/22/22: BSW completed telephone Outreach with patient. He stated he was going to a dentist office but they closed. Patient stated he can get dentures every 10 years, he is coming up on 8 years and they broke. BSW will provide patient a list of dentist that accept Medicaid in [facility]. BSW informed patient he would need to call or go to the [facility] office to have his disability started, BSW will provide patient with the telephone number for SSA. Patient would like for resources to be emailed to [email or web address]. No other resources needed at this time.  ? ?Advanced Directives Status:  Not addressed in this encounter. ? ?Care Plan ?                ?No Known Allergies ? ?Medications Reviewed Today   ? ? Reviewed by Sarina Ill, CMA (Certified Medical Assistant) on 03/17/22 at [phone]  Med List Status: <None>  ? ?Medication Order Taking? Sig Documenting Provider Last Dose Status Informant  ?ACCU-CHEK AVIVA PLUS test strip [id number] Yes 1 EACH BY  OTHER ROUTE 4 (FOUR) TIMES DAILY Renato Shin, MD Taking Active   ?B-D UF III MINI PEN NEEDLES 31G X 5 MM MISC [id number] Yes USE 3 TIMES DAILY Renato Shin, MD Taking Active   ?Blood Pressure Monitor DEVI [id number] Yes Use as directed to check home blood pressure at least 4 times a week. Please notify primary provider of blood pressures greater than 140/90. Please send prescription to [facility].  ?Phone Number [phone] ?Fax Number [phone] [name], NP Taking Active   ?cycloSPORINE (RESTASIS) 0.05 % ophthalmic emulsion [id number] Yes Place 1 drop into both eyes 2 (two) times daily. [name], NP Taking Active Self  ?gabapentin (NEURONTIN) 300 MG capsule [id number] Yes TAKE 1 CAPSULE BY MOUTH EVERYDAY AT BEDTIME [name], NP Taking Active   ?insulin aspart (NOVOLOG FLEXPEN) 100 UNIT/ML FlexPen [id number] Yes 4 times a day (just before each meal): 6-6-4-2 units Renato Shin, MD Taking Active   ?insulin glargine (LANTUS SOLOSTAR) 100 UNIT/ML Solostar Pen [id number] Yes Inject 5 Units into the skin at bedtime. Renato Shin, MD Taking Active   ?latanoprost (XALATAN) 0.005 % ophthalmic solution [id number] Yes SMARTSIG:1 Drop(s) In Eye(s) Every Evening [provider] Taking Active   ?losartan-hydrochlorothiazide (HYZAAR) 100-25 MG tablet [id number] Yes Take 1 tablet by mouth daily. [name], NP Taking Active   ?Multiple Vitamin (MULTIVITAMIN WITH MINERALS) TABS tablet [id number] Yes Take 1 tablet by mouth daily. [provider] Taking Active Self  ?sildenafil (VIAGRA) 25 MG tablet [id number] Yes Take 1 tablet 1/2 hour to 1 hour prior to intercourse  as needed. Limit use to 1/2 tablet or 1 tablet per 24 hours. [name], NP Taking Active   ? ?  ?  ? ?  ? ? ?Patient Active Problem List  ? Diagnosis Date Noted  ? Elevated prostate specific antigen (PSA) 01/18/2022  ? Generalized anxiety disorder 05/05/2021  ? Essential hypertension 04/21/2021  ? 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 Cirrhosis of liver ([place]) 03/14/2014  ? Colon cancer screening 03/14/2014  ? Hypotension, unspecified 03/01/2014  ? DKA (diabetic ketoacidoses) 02/28/2014  ? Hyponatremia 02/28/2014  ? Hepatic encephalopathy ([place]) 08/24/2012  ? Anemia 08/23/2012  ? Malnutrition ([place]) 08/23/2012  ? Acute renal failure ([place]) 08/23/2012  ? Thrombocytopenia ([place]) 08/23/2012  ? Coagulopathy ([place]) 08/23/2012  ? Ascites 08/22/2012  ? 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  ? ? ?Conditions to be addressed/monitored per PCP order:   dental resources ? ?Care Plan : RNCM Plan of Care  ?Updates made by Ethelda Chick since 03/22/2022 12:00 AM  ?  ? 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  ? ?Long-Range Goal: Development of Plan of Care for Chronic Disease Management & Care Coordination Needs   ?Start Date: 07/14/2021  ?Expected End Date: 05/20/2022  ?Priority: High  ?Note:   ?Current Barriers:  ?Knowledge Deficits related to plan of care for management of HTN and DM I and Prostate Issues ?Chronic Disease Management support and education needs related to HTN and DM I and Prostate Issues ? ?RNCM Clinical Goal(s):  ?Patient will verbalize understanding of plan for management of HTN and DM I and Prostate Issues as evidenced by good management of these chronic diseases ?verbalize basic understanding of HTN and DM I and Prostate Issues disease process and self health management plan as evidenced by good management of these chronic diseases ?take all medications exactly as prescribed and will call provider for medication related questions as evidenced by patient being medication compliant.    ?attend all scheduled medical appointments: 03/17/2022 Dr Loanne Drilling; 05/30/2022 PCP Office Visit as evidenced by          ?demonstrate ongoing adherence to prescribed treatment plan for HTN and DM I and Prostate Issues as evidenced by noted improvement of chronic  disease managment of these diseases. ?demonstrate ongoing health management independence as evidenced by good maintenance of HTN, DM I  and Prostate Issues.        ?continue to work with Consulting civil engineer and/or Social Worker to address care management and care coordination needs related to HTN and DM I and Prostate Issues as evidenced by adherence to CM Team Scheduled appointments     ?work with community resource care guide to address needs related to [employer] knowledge of community resource: Corporate investment banker and Applying for Disability as evidenced by patient and/or community resource care guide support    through collaboration with Consulting civil engineer, provider, and care team.  ? ?Interventions: ?Inter-disciplinary care team collaboration (see longitudinal plan of care) ?Evaluation of current treatment plan related to  self management and patient's adherence to plan as established by provider ?03/22/22: BSW completed telephone Outreach with patient. He stated he was going to a dentist office but they closed. Patient stated he can get dentures every 10 years, he is coming up on 8 years and they broke. BSW will provide patient a list of dentist that accept Medicaid in [facility]. BSW informed patient he would need to call or  go to the [facility] office to have his disability started, BSW will provide patient with the telephone number for SSA. Patient would like for resources to be emailed to [email or web address]. No other resources needed at this time.  ? ?Diabetes:  (Status: Goal on Track (progressing): YES.) ?Lab Results  ?Component Value Date  ? HGBA1C 7.6 (A) 12/16/2021  ?  ?Assessed patient's understanding of A1c goal: <7% ?Provided education to patient about basic DM disease process; ?Reviewed medications with patient and discussed importance of medication adherence;        ?Counseled on importance of regular laboratory monitoring as prescribed;        ?Discussed plans with patient for ongoing care management  follow up and provided patient with direct contact information for care management team;      ?Reviewed patient's knowledge of signs & symptoms of hypo and hyperglycemia and importance of correct treatment;

## 2022-03-22 NOTE — Patient Instructions (Signed)
Visit Information ? ?Patrick [name] was given information about Medicaid Managed Care team care coordination services as a part of their [facility] Medicaid benefit. [name] verbally consented to engagement with the [facility] Managed Care team.  ? ?If you are experiencing a medical emergency, please call 911 or report to your local emergency department or urgent care.  ? ?If you have a non-emergency medical problem during routine business hours, please contact your provider's office and ask to speak with a nurse.  ? ?For questions related to your [facility] health plan, please call: [phone] or go here:[email or web address] ? ?If you would like to schedule transportation through your [facility] plan, please call the following number at least 2 days in advance of your appointment: [phone]. ? You can also use the MTM portal or MTM mobile app to manage your rides. For the portal, please go to [email or web address]. ? ?Call the [place] at [phone], at any time, 24 hours a day, 7 days a week. If you are in danger or need immediate medical attention call 911. ? ?If you would like help to quit smoking, call 1-800-QUIT-NOW 623-792-1393) OR Espa?ol: 1-855-D?jelo-Ya (321)790-9092) o para m?s informaci?n haga clic aqu? or Text READY to 200-400 to register via text ? ?[name] - following are the goals we discussed in your visit today:  ? Goals Addressed   ?None ?  ? ? ? ?Social Worker will follow up with patient in 14 days .  ? ?Mickel Fuchs, BSW, [place] ?[place]  ?High Risk Managed Medicaid Team  ?[phone]  ? ?Following is a copy of your plan of care:  ?Care Plan : [place]  ?Updates made by Ethelda Chick since 03/22/2022 12:00 AM  ?  ? 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  ? ?Long-Range Goal: Development of Plan of Care for Chronic Disease Management & Care Coordination Needs    ?Start Date: 07/14/2021  ?Expected End Date: 05/20/2022  ?Priority: High  ?Note:   ?Current Barriers:  ?Knowledge Deficits related to plan of care for management of HTN and DM I and Prostate Issues ?Chronic Disease Management support and education needs related to HTN and DM I and Prostate Issues ? ?RNCM Clinical Goal(s):  ?Patient will verbalize understanding of plan for management of HTN and DM I and Prostate Issues as evidenced by good management of these chronic diseases ?verbalize basic understanding of HTN and DM I and Prostate Issues disease process and self health management plan as evidenced by good management of these chronic diseases ?take all medications exactly as prescribed and will call provider for medication related questions as evidenced by patient being medication compliant.    ?attend all scheduled medical appointments: 03/17/2022 Dr Loanne Drilling; 05/30/2022 PCP Office Visit as evidenced by          ?demonstrate ongoing adherence to prescribed treatment plan for HTN and DM I and Prostate Issues as evidenced by noted improvement of chronic disease managment of these diseases. ?demonstrate ongoing health management independence as evidenced by good maintenance of HTN, DM I  and Prostate Issues.        ?continue to work with Consulting civil engineer and/or Social Worker to address care management and care coordination needs related to HTN and DM I and Prostate Issues as evidenced by adherence to CM Team Scheduled appointments     ?work with community resource care guide to address needs  related to [employer] knowledge of community resource: Corporate investment banker and Applying for Disability as evidenced by patient and/or community resource care guide support    through collaboration with Consulting civil engineer, provider, and care team.  ? ?Interventions: ?Inter-disciplinary care team collaboration (see longitudinal plan of care) ?Evaluation of current treatment plan related to  self management and patient's adherence to plan as  established by provider ?03/22/22: BSW completed telephone Outreach with patient. He stated he was going to a dentist office but they closed. Patient stated he can get dentures every 10 years, he is coming up on 8 years and they broke. BSW will provide patient a list of dentist that accept Medicaid in [facility]. BSW informed patient he would need to call or go to the [facility] office to have his disability started, BSW will provide patient with the telephone number for SSA. Patient would like for resources to be emailed to [email or web address]. No other resources needed at this time.  ? ?Diabetes:  (Status: Goal on Track (progressing): YES.) ?Lab Results  ?Component Value Date  ? HGBA1C 7.6 (A) 12/16/2021  ?  ?Assessed patient's understanding of A1c goal: <7% ?Provided education to patient about basic DM disease process; ?Reviewed medications with patient and discussed importance of medication adherence;        ?Counseled on importance of regular laboratory monitoring as prescribed;        ?Discussed plans with patient for ongoing care management follow up and provided patient with direct contact information for care management team;      ?Reviewed patient's knowledge of signs & symptoms of hypo and hyperglycemia and importance of correct treatment;       ?Reviewed scheduled/upcoming provider appointments including: Endocrinologist on 03/17/2022; PCP office visit on 05/30/2022        ?Continue education to patient regarding importance and rationale for checking blood sugars 3 times/day and record.  Patient has continuous blood sugar monitor Dexcom for last 2 - 3 months and is knowledgeable about using it.   ?Patient reports his blood sugar range is between 110 - 180 throughout the day and can occasionally be higher into 200 range during the night.  He verbalizes understanding that elevated blood sugar is related to what he has eaten at dinner and occasional bedtime snack.   ?Follow up on referral made to BSW  for assistance with finding dental group that works with medicaid patients to repair dentures and have added information regarding applying for disability;      ?Review of patient status, including review of consultants reports, relevant laboratory and other test results, and medications completed;   ?      ? ?Hypertension: (Status: Goal on Track (progressing): YES.) ?Last practice recorded BP readings:  ?BP Readings from Last 3 Encounters:  ?02/28/22 (!) 143/81  ?12/16/21 (!) 160/80  ?12/02/21 126/76  ?  ? ?Most recent eGFR/CrCl:  ?Lab Results  ?Component Value Date  ? EGFR 65 06/02/2021  ?  No components found for: CRCL ? ?Evaluation of current treatment plan related to hypertension self management and patient's adherence to plan as established by provider;   ?Reviewed prescribed diet Low Sodium ?Reviewed medications with patient and discussed importance of compliance;  ?Counseled on the importance of exercise goals with target of 150 minutes per week ?Discussed plans with patient for ongoing care management follow up and provided patient with direct contact information for care management team; ?Advised patient, providing education and rationale, to monitor blood pressure daily and record, calling PCP  for findings outside established parameters;  ?Reviewed scheduled/upcoming provider appointments including:  ?Patient received blood pressure monitor for home use.  Patient checks blood pressure everyday.  Patient reports blood pressure range at home is 108 - 117/80's.  This RN Care Manager provided education that patient's blood pressure may be slightly elevated at PCP office visit which is completely normal due to ' white coat syndrome' and home blood pressure readings are important for PCP to see at office visit.  Instructed patient to bring his home blood pressure monitor with him to PCP office visits. ? ? ?Prostate Issues:  (Status:  New goal.)  Long Term Goal ?Evaluation of current treatment plan related to   Prostate Issues with elevated PSA level of 12.18 one month ago. , Lacks knowledge of community resource: dental resources for broken dentures and information regarding applying for disability,  self-management

## 2022-04-01 ENCOUNTER — Other Ambulatory Visit: Payer: Self-pay | Admitting: Endocrinology

## 2022-04-02 ENCOUNTER — Other Ambulatory Visit: Payer: Self-pay | Admitting: Family

## 2022-04-02 DIAGNOSIS — E1042 Type 1 diabetes mellitus with diabetic polyneuropathy: Secondary | ICD-10-CM

## 2022-04-11 ENCOUNTER — Other Ambulatory Visit: Payer: Self-pay

## 2022-04-11 NOTE — Patient Instructions (Signed)
Visit Information  Patrick [name] was given information about Medicaid Managed Care team care coordination services as a part of their [facility] Medicaid benefit. Patrick [name] verbally consented to engagement with the [facility] Managed Care team.   If you are experiencing a medical emergency, please call 911 or report to your local emergency department or urgent care.   If you have a non-emergency medical problem during routine business hours, please contact your provider's office and ask to speak with a nurse.   For questions related to your [facility] health plan, please call: [phone] or go here:[email or web address]  If you would like to schedule transportation through your [facility] plan, please call the following number at least 2 days in advance of your appointment: [phone].  You can also use the MTM portal or MTM mobile app to manage your rides. For the portal, please go to [email or web address].  Call the [place] at [phone], at any time, 24 hours a day, 7 days a week. If you are in danger or need immediate medical attention call 911.  If you would like help to quit smoking, call 1-800-QUIT-NOW 701 490 6551) OR Espaol: 1-855-Djelo-Ya (6-568-127-5170) o para ms informacin haga clic aqu or Text READY to 200-400 to register via text  Patrick [name] - following are the goals we discussed in your visit today:   Goals Addressed   None     The  Patient                                              has been provided with contact information for the Managed Medicaid care management team and has been advised to call with any health related questions or concerns.   Patrick [name], BSW, [place]  High Risk Managed Medicaid Team  [phone]   Following is a copy of your plan of care:  Care Plan : RNCM Plan of Care  Updates made by Patrick [name] since 04/11/2022 12:00 AM     Problem: Chronic  Disease Management & Care Coordination Needs for DMI & HTN      Long-Range Goal: Development of Plan of Care for Chronic Disease Management & Care Coordination Needs   Start Date: 07/14/2021  Expected End Date: 05/20/2022  Priority: High  Note:   Current Barriers:  Knowledge Deficits related to plan of care for management of HTN and DM I and Prostate Issues Chronic Disease Management support and education needs related to HTN and DM I and Prostate Issues  RNCM Clinical Goal(s):  Patient will verbalize understanding of plan for management of HTN and DM I and Prostate Issues as evidenced by good management of these chronic diseases verbalize basic understanding of HTN and DM I and Prostate Issues disease process and self health management plan as evidenced by good management of these chronic diseases take all medications exactly as prescribed and will call provider for medication related questions as evidenced by patient being medication compliant.    attend all scheduled medical appointments: 03/17/2022 Dr Patrick [name]; 05/30/2022 PCP Office Visit as evidenced by          demonstrate ongoing adherence to prescribed treatment plan for HTN and DM I and Prostate Issues as evidenced by noted improvement of chronic disease managment of these diseases. demonstrate ongoing health management independence as evidenced by good maintenance of HTN,  DM I  and Prostate Issues.        continue to work with Consulting civil engineer and/or Social Worker to address care management and care coordination needs related to HTN and DM I and Prostate Issues as evidenced by adherence to CM Team Scheduled appointments     work with [employer] care guide to address needs related to [employer] of community resource: Corporate investment banker and Applying for Disability as evidenced by patient and/or community resource care guide support    through collaboration with Consulting civil engineer, provider, and care team.    Interventions: Inter-disciplinary care team collaboration (see longitudinal plan of care) Evaluation of current treatment plan related to  self management and patient's adherence to plan as established by provider 03/22/22: BSW completed telephone Outreach with patient. He stated he was going to a dentist office but they closed. Patient stated he can get dentures every 10 years, he is coming up on 8 years and they broke. BSW will provide patient a list of dentist that accept Medicaid in [facility]. BSW informed patient he would need to call or go to the [facility] office to have his disability started, BSW will provide patient with the telephone number for SSA. Patient would like for resources to be emailed to [email or web address]. No other resources needed at this time.  04/11/22: BSW completed telephone outreach with patient. He stated he did receive the email of resources and has called some of them. He states he is waiting for a few things and would give the other resources a call today. Patient stated he does not need assistance with anything else and no other resources are needed at this time.   Diabetes:  (Status: Goal on Track (progressing): YES.) Lab Results  Component Value Date   HGBA1C 7.6 (A) 12/16/2021    Assessed patient's understanding of A1c goal: <7% Provided education to patient about basic DM disease process; Reviewed medications with patient and discussed importance of medication adherence;        Counseled on importance of regular laboratory monitoring as prescribed;        Discussed plans with patient for ongoing care management follow up and provided patient with direct contact information for care management team;      Reviewed patient's knowledge of signs & symptoms of hypo and hyperglycemia and importance of correct treatment;       Reviewed scheduled/upcoming provider appointments including: Endocrinologist on 03/17/2022; PCP office visit on 05/30/2022         Continue education to patient regarding importance and rationale for checking blood sugars 3 times/day and record.  Patient has continuous blood sugar monitor Dexcom for last 2 - 3 months and is knowledgeable about using it.   Patient reports his blood sugar range is between 110 - 180 throughout the day and can occasionally be higher into 200 range during the night.  He verbalizes understanding that elevated blood sugar is related to what he has eaten at dinner and occasional bedtime snack.   Follow up on referral made to BSW for assistance with finding dental group that works with medicaid patients to repair dentures and have added information regarding applying for disability;      Review of patient status, including review of consultants reports, relevant laboratory and other test results, and medications completed;          Hypertension: (Status: Goal on Track (progressing): YES.) Last practice recorded BP readings:  BP Readings from Last 3 Encounters:  02/28/22 Marland Kitchen)  143/81  12/16/21 (!) 160/80  12/02/21 126/76     Most recent eGFR/CrCl:  Lab Results  Component Value Date   EGFR 65 06/02/2021    No components found for: CRCL  Evaluation of current treatment plan related to hypertension self management and patient's adherence to plan as established by provider;   Reviewed prescribed diet Low Sodium Reviewed medications with patient and discussed importance of compliance;  Counseled on the importance of exercise goals with target of 150 minutes per week Discussed plans with patient for ongoing care management follow up and provided patient with direct contact information for care management team; Advised patient, providing education and rationale, to monitor blood pressure daily and record, calling PCP for findings outside established parameters;  Reviewed scheduled/upcoming provider appointments including:  Patient received blood pressure monitor for home use.  Patient checks blood  pressure everyday.  Patient reports blood pressure range at home is 108 - 117/80's.  This RN Care Manager provided education that patient's blood pressure may be slightly elevated at PCP office visit which is completely normal due to ' white coat syndrome' and home blood pressure readings are important for PCP to see at office visit.  Instructed patient to bring his home blood pressure monitor with him to PCP office visits.   Prostate Issues:  (Status:  New goal.)  Long Term Goal Evaluation of current treatment plan related to  Prostate Issues with elevated PSA level of 12.18 one month ago. , Lacks knowledge of community resource: dental resources for broken dentures and information regarding applying for disability,  self-management and patient's adherence to plan as established by provider. Discussed plans with patient for ongoing care management follow up and provided patient with direct contact information for care management team Evaluation of current treatment plan related to elevated PSA level to 12.18 and patient's adherence to plan as established by provider Collaborated with [place] and Nephrology office regarding moving forward with ordered MRI referral for Prostate. Reviewed scheduled/upcoming provider appointments including Social Work referral for dental resources for broken denture and applying for disability. Discussed plans with patient for ongoing care management follow up and provided patient with direct contact information for care management team   Patient Goals/Self-Care Activities: Patient will self administer medications as prescribed Patient will attend all scheduled provider appointments Patient will call pharmacy for medication refills Patient will continue to perform ADL's independently Patient will continue to perform IADL's independently Patient will call provider office for new concerns or questions

## 2022-04-11 NOTE — Patient Outreach (Signed)
Medicaid Managed Care Social Work Note  04/11/2022 Name:  KEMANI DEMARAIS MRN:  [id number] DOB:  Feb 20, 1961  [name] is an 61 y.o. year old male who is a primary patient of [name], NP.  The [facility] Managed Care Coordination team was consulted for assistance with:  Community Resources   Mr. Moya was given information about Medicaid Managed Care Coordination team services today. [name] Patient agreed to services and verbal consent obtained.  Engaged with patient  for by telephone forfollow up visit in response to referral for case management and/or care coordination services.   Assessments/Interventions:  Review of past medical history, allergies, medications, health status, including review of consultants reports, laboratory and other test data, was performed as part of comprehensive evaluation and provision of chronic care management services.  SDOH: (Social Determinant of Health) assessments and interventions performed: BSW completed telephone outreach with patient. He stated he did receive the email of resources and has called some of them. He states he is waiting for a few things and would give the other resources a call today. Patient stated he does not need assistance with anything else and no other resources are needed at this time.   Advanced Directives Status:  Not addressed in this encounter.  Care Plan                 No Known Allergies  Medications Reviewed Today     Reviewed by Sarina Ill, CMA (Certified Medical Assistant) on 03/17/22 at 0751  Med List Status: <None>   Medication Order Taking? Sig Documenting Provider Last Dose Status Informant  ACCU-CHEK AVIVA PLUS test strip [id number] Yes 1 EACH BY OTHER ROUTE 4 (FOUR) TIMES DAILY Renato Shin, MD Taking Active   B-D UF III MINI PEN NEEDLES 31G X 5 MM MISC [id number] Yes USE 3 TIMES DAILY Renato Shin, MD Taking Active   Blood Pressure Monitor DEVI [id number] Yes Use as directed to check  home blood pressure at least 4 times a week. Please notify primary provider of blood pressures greater than 140/90. Please send prescription to [facility].  Phone Number [phone] Fax Number [phone] [name], NP Taking Active   cycloSPORINE (RESTASIS) 0.05 % ophthalmic emulsion [id number] Yes Place 1 drop into both eyes 2 (two) times daily. [name], NP Taking Active Self  gabapentin (NEURONTIN) 300 MG capsule [id number] Yes TAKE 1 CAPSULE BY MOUTH EVERYDAY AT BEDTIME [name], NP Taking Active   insulin aspart (NOVOLOG FLEXPEN) 100 UNIT/ML FlexPen [id number] Yes 4 times a day (just before each meal): 6-6-4-2 units Renato Shin, MD Taking Active   insulin glargine (LANTUS SOLOSTAR) 100 UNIT/ML Solostar Pen [id number] Yes Inject 5 Units into the skin at bedtime. Renato Shin, MD Taking Active   latanoprost [name]) 0.005 % ophthalmic solution [id number] Yes SMARTSIG:1 Drop(s) In Eye(s) Every Evening [provider] Taking Active   losartan-hydrochlorothiazide (HYZAAR) 100-25 MG tablet [id number] Yes Take 1 tablet by mouth daily. [name], NP Taking Active   Multiple Vitamin (MULTIVITAMIN WITH MINERALS) TABS tablet [id number] Yes Take 1 tablet by mouth daily. [provider] Taking Active Self  sildenafil (VIAGRA) 25 MG tablet [id number] Yes Take 1 tablet 1/2 hour to 1 hour prior to intercourse as needed. Limit use to 1/2 tablet or 1 tablet per 24 hours. 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[place]) 03/22/2014   Cirrhosis of liver ([place]) 03/14/2014   Colon cancer screening 03/14/2014   Hypotension, unspecified 03/01/2014   DKA (diabetic ketoacidoses) 02/28/2014   Hyponatremia 02/28/2014   Hepatic  encephalopathy ([place]) 08/24/2012   Anemia 08/23/2012   Malnutrition ([place]) 08/23/2012   Acute renal failure ([place]) 08/23/2012   Thrombocytopenia ([place]) 08/23/2012   Coagulopathy ([place]) 08/23/2012   Ascites 88/89/1694   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    Conditions to be addressed/monitored per PCP order:   dental resources  Care Plan : [facility] Plan of Care  Updates made by Ethelda Chick since 04/11/2022 12:00 AM     Problem: Chronic Disease Management & Care Coordination Needs for DMI & HTN      Long-Range Goal: Development of Plan of Care for Chronic Disease Management & Care Coordination Needs   Start Date: 07/14/2021  Expected End Date: 05/20/2022  Priority: High  Note:   Current Barriers:  Knowledge Deficits related to plan of care for management of HTN and DM I and Prostate Issues Chronic Disease Management support and education needs related to HTN and DM I and Prostate Issues  RNCM Clinical Goal(s):  Patient will verbalize understanding of plan for management of HTN and DM I and Prostate Issues as evidenced by good management of these chronic diseases verbalize basic understanding of HTN and DM I and Prostate Issues disease process and self health management plan as evidenced by good management of these chronic diseases take all medications exactly as prescribed and will call provider for medication related questions as evidenced by patient being medication compliant.    attend all scheduled medical appointments: 03/17/2022 Dr Loanne Drilling; 05/30/2022 PCP Office Visit as evidenced by          demonstrate ongoing adherence to prescribed treatment plan for HTN and DM I and Prostate Issues as evidenced by noted improvement of chronic disease managment of these diseases. demonstrate ongoing health management independence as evidenced by good maintenance of HTN, DM I  and Prostate Issues.        continue to work with Consulting civil engineer and/or Social Worker to address care  management and care coordination needs related to HTN and DM I and Prostate Issues as evidenced by adherence to CM Team Scheduled appointments     work with [employer] care guide to address needs related to [employer] of community resource: Corporate investment banker and Applying for Disability as evidenced by patient and/or community resource care guide support    through collaboration with Consulting civil engineer, provider, and care team.   Interventions: Inter-disciplinary care team collaboration (see longitudinal plan of care) Evaluation of current treatment plan related to  self management and patient's adherence to plan as established by provider 03/22/22: BSW completed telephone Outreach with patient. He stated he was going to a dentist office but they closed. Patient stated he can get dentures every 10 years, he is coming up on 8 years and they broke. BSW will provide patient a list of dentist that accept Medicaid in [facility]. BSW informed patient he would need to call or go to the [facility] office to have his disability started, BSW will provide patient with the telephone number for SSA. Patient would like for resources to be emailed to [email or web address]. No other resources needed at this time.  04/11/22:  BSW completed telephone outreach with patient. He stated he did receive the email of resources and has called some of them. He states he is waiting for a few things and would give the other resources a call today. Patient stated he does not need assistance with anything else and no other resources are needed at this time.   Diabetes:  (Status: Goal on Track (progressing): YES.) Lab Results  Component Value Date   HGBA1C 7.6 (A) 12/16/2021    Assessed patient's understanding of A1c goal: <7% Provided education to patient about basic DM disease process; Reviewed medications with patient and discussed importance of medication adherence;        Counseled on importance of regular  laboratory monitoring as prescribed;        Discussed plans with patient for ongoing care management follow up and provided patient with direct contact information for care management team;      Reviewed patient's knowledge of signs & symptoms of hypo and hyperglycemia and importance of correct treatment;       Reviewed scheduled/upcoming provider appointments including: Endocrinologist on 03/17/2022; PCP office visit on 05/30/2022        Continue education to patient regarding importance and rationale for checking blood sugars 3 times/day and record.  Patient has continuous blood sugar monitor Dexcom for last 2 - 3 months and is knowledgeable about using it.   Patient reports his blood sugar range is between 110 - 180 throughout the day and can occasionally be higher into 200 range during the night.  He verbalizes understanding that elevated blood sugar is related to what he has eaten at dinner and occasional bedtime snack.   Follow up on referral made to BSW for assistance with finding dental group that works with medicaid patients to repair dentures and have added information regarding applying for disability;      Review of patient status, including review of consultants reports, relevant laboratory and other test results, and medications completed;          Hypertension: (Status: Goal on Track (progressing): YES.) Last practice recorded BP readings:  BP Readings from Last 3 Encounters:  02/28/22 (!) 143/81  12/16/21 (!) 160/80  12/02/21 126/76     Most recent eGFR/CrCl:  Lab Results  Component Value Date   EGFR 65 06/02/2021    No components found for: CRCL  Evaluation of current treatment plan related to hypertension self management and patient's adherence to plan as established by provider;   Reviewed prescribed diet Low Sodium Reviewed medications with patient and discussed importance of compliance;  Counseled on the importance of exercise goals with target of 150 minutes per  week Discussed plans with patient for ongoing care management follow up and provided patient with direct contact information for care management team; Advised patient, providing education and rationale, to monitor blood pressure daily and record, calling PCP for findings outside established parameters;  Reviewed scheduled/upcoming provider appointments including:  Patient received blood pressure monitor for home use.  Patient checks blood pressure everyday.  Patient reports blood pressure range at home is 108 - 117/80's.  This RN Care Manager provided education that patient's blood pressure may be slightly elevated at PCP office visit which is completely normal due to ' white coat syndrome' and home blood pressure readings are important for PCP to see at office visit.  Instructed patient to bring his home blood pressure monitor with him to PCP office visits.   Prostate Issues:  (Status:  New goal.)  Long Term Goal Evaluation of current treatment plan related to  Prostate Issues with elevated PSA level of 12.18 one month ago. , Lacks knowledge of community resource: dental resources for broken dentures and information regarding applying for disability,  self-management and patient's adherence to plan as established by provider. Discussed plans with patient for ongoing care management follow up and provided patient with direct contact information for care management team Evaluation of current treatment plan related to elevated PSA level to 12.18 and patient's adherence to plan as established by provider Collaborated with [place] and Nephrology office regarding moving forward with ordered MRI referral for Prostate. Reviewed scheduled/upcoming provider appointments including Social Work referral for dental resources for broken denture and applying for disability. Discussed plans with patient for ongoing care management follow up and provided patient with direct contact information for care  management team   Patient Goals/Self-Care Activities: Patient will self administer medications as prescribed Patient will attend all scheduled provider appointments Patient will call pharmacy for medication refills Patient will continue to perform ADL's independently Patient will continue to perform IADL's independently Patient will call provider office for new concerns or questions       Follow up:  Patient agrees to Care Plan and Follow-up.  Plan: The  Patient has been provided with contact information for the Managed Medicaid care management team and has been advised to call with any health related questions or concerns.   Mickel Fuchs, BSW, [place] Managed Medicaid Team  [phone]

## 2022-04-12 ENCOUNTER — Other Ambulatory Visit: Payer: Self-pay

## 2022-04-12 NOTE — Patient Outreach (Signed)
Medicaid Managed Care   Nurse Care Manager Note  04/12/2022 Name:  Patrick [name] MRN:  [id number] DOB:  08-14-1961  Patrick [name] is an 61 y.o. year old male who is a primary patient of [name], NP.  The [facility] Managed Care Coordination team was consulted for assistance with:    HTN DMI Prostate Issues  Mr. Mainer was given information about Medicaid Managed Care Coordination team services today. Patrick [name] Patient agreed to services and verbal consent obtained.  Engaged with patient by telephone for follow up visit in response to provider referral for case management and/or care coordination services.   Assessments/Interventions:  Review of past medical history, allergies, medications, health status, including review of consultants reports, laboratory and other test data, was performed as part of comprehensive evaluation and provision of chronic care management services.  SDOH (Social Determinants of Health) assessments and interventions performed:   Care Plan  No Known Allergies  Medications Reviewed Today     Reviewed by Inge Rise, RN (Case Manager) on 04/12/22 at 37  Med List Status: <None>   Medication Order Taking? Sig Documenting Provider Last Dose Status Informant  ACCU-CHEK AVIVA PLUS test strip [id number] No 1 EACH BY OTHER ROUTE 4 (FOUR) TIMES DAILY Renato Shin, MD Taking Active   B-D UF III MINI PEN NEEDLES 31G X 5 MM MISC [id number]  USE 3 TIMES DAILY [name], Melanie Crazier, MD  Active   Blood Pressure Monitor DEVI [id number] No Use as directed to check home blood pressure at least 4 times a week. Please notify primary provider of blood pressures greater than 140/90. Please send prescription to [facility].  Phone Number [phone] Fax Number [phone] [name], NP Taking Active   cycloSPORINE (RESTASIS) 0.05 % ophthalmic emulsion [id number] No Place 1 drop into both eyes 2 (two) times daily. [name], NP Taking  Active Self  gabapentin (NEURONTIN) 300 MG capsule [id number]  TAKE 1 CAPSULE BY MOUTH EVERYDAY AT BEDTIME [name], NP  Active   insulin aspart (NOVOLOG FLEXPEN) 100 UNIT/ML FlexPen [id number] No 4 times a day (just before each meal): 6-6-4-2 units Renato Shin, MD Taking Active   insulin glargine (LANTUS SOLOSTAR) 100 UNIT/ML Solostar Pen [id number]  Inject 8 Units into the skin at bedtime. Renato Shin, MD  Active   latanoprost (XALATAN) 0.005 % ophthalmic solution [id number] No SMARTSIG:1 Drop(s) In Eye(s) Every Evening [provider] Taking Active   losartan-hydrochlorothiazide (HYZAAR) 100-25 MG tablet [id number] No Take 1 tablet by mouth daily. [name], NP Taking Active   Multiple Vitamin (MULTIVITAMIN WITH MINERALS) TABS tablet [id number] No Take 1 tablet by mouth daily. [provider] Taking Active Self  sildenafil (VIAGRA) 25 MG tablet [id number] No Take 1 tablet 1/2 hour to 1 hour prior to intercourse as needed. Limit use to 1/2 tablet or 1 tablet per 24 hours. 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[place]) 03/22/2014   Cirrhosis of liver ([place]) 03/14/2014   Colon cancer screening 03/14/2014   Hypotension, unspecified 03/01/2014   DKA (diabetic ketoacidoses) 02/28/2014   Hyponatremia 02/28/2014   Hepatic encephalopathy ([place]) 08/24/2012   Anemia 08/23/2012   Malnutrition ([place]) 08/23/2012   Acute renal  failure ([place]) 08/23/2012   Thrombocytopenia ([place]) 08/23/2012   Coagulopathy ([place]) 08/23/2012   Ascites 09/17/2535   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    Conditions to be addressed/monitored per PCP order:  HTN, DMI, and Prostate Issues  Care Plan : [facility] Plan of Care  Updates made by Inge Rise, RN since 04/12/2022 12:00 AM     Problem: Chronic Disease Management & Care Coordination Needs for DMI & HTN      Long-Range Goal: Development of Plan of Care for Chronic Disease Management & Care Coordination Needs   Start Date: 07/14/2021  Expected End Date: 07/20/2022  Priority: High  Note:   Current Barriers:  Knowledge Deficits related to plan of care for management of HTN and DM I and Prostate Issues Chronic Disease Management support and education needs related to HTN and DM I and Prostate Issues  RNCM Clinical Goal(s):  Patient will verbalize understanding of plan for management of HTN and DM I and Prostate Issues as evidenced by good management of these chronic diseases verbalize basic understanding of HTN and DM I and Prostate Issues disease process and self health management plan as evidenced by good management of these chronic diseases take all medications exactly as prescribed and will call provider for medication related questions as evidenced by patient being medication compliant.    attend all scheduled medical appointments: 03/17/2022 Dr Loanne Drilling; 05/30/2022 PCP Office Visit as evidenced by          demonstrate ongoing adherence to prescribed treatment plan for HTN and DM I and Prostate Issues as evidenced by noted improvement of chronic disease managment of these diseases. demonstrate ongoing health management independence as evidenced by good maintenance of HTN, DM I  and Prostate Issues.        continue to work with Consulting civil engineer and/or Social Worker to address care management and care coordination needs related to HTN and DM I and Prostate Issues as evidenced by adherence to CM Team Scheduled appointments     work with [employer] care guide to address needs related to [employer] of community resource: Corporate investment banker and Applying for Disability as evidenced by patient and/or community resource care guide support    through collaboration with Consulting civil engineer, provider, and care team.   Interventions: Inter-disciplinary care team collaboration (see longitudinal plan of care) Evaluation of current treatment plan related to  self management and patient's adherence to plan as established by provider 03/22/22: BSW completed telephone Outreach with patient. He stated he was going to a dentist office but they closed. Patient stated he can get dentures every 10 years, he is coming up on 8 years and they broke. BSW will provide patient a list of dentist that accept Medicaid in [facility]. BSW informed patient he would need to call or go to the [facility] office to have his disability started, BSW will provide patient with the telephone number for [facility]. Patient would like for resources to be emailed to [email or web address]. No other resources needed at this time.  04/11/22: BSW completed telephone outreach with patient. He stated he did receive the email of resources and has called some of them. He states he is waiting for a few things and would give the other resources a call today. Patient stated he does not need assistance with anything else and no other resources are needed at this time.   Diabetes:  (Status: Goal on Track (progressing): YES.) Lab Results  Component Value Date  HGBA1C 8.1 (A) 03/17/2022    Lab Results  Component Value Date   HGBA1C 7.6 (A) 12/16/2021    Assessed patient's understanding of A1c goal: <7% Provided education to patient about basic DM disease process; Reviewed medications with patient and discussed importance of medication adherence;        Counseled on importance of regular laboratory monitoring as prescribed;        Discussed plans with patient for ongoing care management follow up and provided patient with direct contact information for care management team;      Reviewed patient's knowledge of signs & symptoms of hypo and hyperglycemia and importance of correct treatment;       Reviewed scheduled/upcoming  provider appointments including: 05/30/2022 PCP office visit; 06/17/2022 Labs; 06/21/2022 Dr Dwyane Dee (New Endocrinologist)       Continue education to patient regarding importance and rationale for checking blood sugars at least 4 times/day and record.  Patient has continuous blood sugar monitor Dexcom for last 2 - 3 months and is knowledgeable about using it.   Patient reports his blood sugar reading after breakfast today was 150. Blood sugar range is between 130 - 180  throughout the day and can occasionally be higher into 200 range during the night. His highest blood sugar reading recently was 220.  Patient states his blood sugars do not remain over 200 and knows that movement throughout the day brings his blood sugars down.  This RN Care Manager provided education on how any exercise or body movement is similar to a shot of insulin and helps reduce blood sugars. Patient verbalized understanding and talked about how blood sugars are directly connected to his food intake.   Patient denies any signs or symptoms of Hyperglycemia but does reports occasional Hypoglycemic episodes when blood sugar drops to 70 or 75.  Patient experiences dizziness with hypoglycemia.  Patient aware of what to do when hypoglycemia occurs. Patient working with BSW for assistance with finding dental group that works with medicaid patients to repair dentures and have added information regarding applying for disability;  Patient reports he has appointments made for both dental and applying for disability.             Hypertension: (Status: Goal on Track (progressing): YES.) Last practice recorded BP readings:  BP Readings from Last 3 Encounters:  03/17/22 130/80  02/28/22 (!) 143/81  12/16/21 (!) 160/80    Most recent eGFR/CrCl:  Lab Results  Component Value Date   EGFR 65 06/02/2021    No components found for: CRCL  Evaluation of current treatment plan related to hypertension self management and patient's adherence to plan as  established by provider;   Reviewed prescribed diet Low Sodium. Patient uses salt substitutes such as Mrs. Dash.  Reviewed medications with patient and discussed importance of compliance;  Counseled on the importance of exercise goals with target of 150 minutes per week Discussed plans with patient for ongoing care management follow up and provided patient with direct contact information for care management team; Advised patient, providing education and rationale, to monitor blood pressure daily and record, calling PCP for findings outside established parameters;  Reviewed scheduled/upcoming provider appointments including:  05/30/2022 PCP Office Visit; 06/17/2022 Labs; 06/21/2022 Dr. Dwyane Dee (new endocrinologist) Patient reports blood pressure range at home is 110's /80's.  This RN Care Manager provided continued education that patient's blood pressure may be slightly elevated at PCP office visit which is completely normal due to 'white coat syndrome' and home blood pressure  readings are important for PCP to see at office visit.    Prostate Issues:  (Status:  New goal.)  Long Term Goal Evaluation of current treatment plan related to  Prostate Issues with elevated PSA level of 12.18 one month ago. , Lacks knowledge of community resource: dental resources for broken dentures and information regarding applying for disability,  self-management and patient's adherence to plan as established by provider. Discussed plans with patient for ongoing care management follow up and provided patient with direct contact information for care management team Evaluation of current treatment plan related to elevated PSA level to 12.18 and patient's adherence to plan as established by provider Reviewed scheduled/upcoming provider appointments including Social Work referral for dental resources for broken denture and applying for disability. Discussed plans with patient for ongoing care management follow up and provided  patient with direct contact information for care management team  Patient awaiting to hear from either Dr. Shann Medal office or [place] regarding ordered MRI of prostate.  Awaiting Medicaid Insurance approval after Dr. Shann Medal office resubmitted required information to insurance for approval.   Patient Goals/Self-Care Activities: Patient will self administer medications as prescribed Patient will attend all scheduled provider appointments Patient will call pharmacy for medication refills Patient will continue to perform ADL's independently Patient will continue to perform IADL's independently Patient will call provider office for new concerns or questions       Follow Up:  Patient agrees to Care Plan and Follow-up.  Plan: The Managed Medicaid care management team will reach out to the patient again over the next 60 days.  Date/time of next scheduled RN care management/care coordination outreach:  June 07, 2022 at 10:30 AM  [place] [place] [facility] Mobile: [phone]

## 2022-04-12 NOTE — Patient Instructions (Signed)
Visit Information  Mr. Amon was given information about Medicaid Managed Care team care coordination services as a part of their [facility] Medicaid benefit. [name] verbally consented to engagement with the [facility] Managed Care team.   If you are experiencing a medical emergency, please call 911 or report to your local emergency department or urgent care.   If you have a non-emergency medical problem during routine business hours, please contact your provider's office and ask to speak with a nurse.   For questions related to your [facility] health plan, please call: [phone] or go here:[email or web address]  If you would like to schedule transportation through your [facility] plan, please call the following number at least 2 days in advance of your appointment: [phone].  You can also use the MTM portal or MTM mobile app to manage your rides. For the portal, please go to [email or web address].  Call the [place] at [phone], at any time, 24 hours a day, 7 days a week. If you are in danger or need immediate medical attention call 911.  If you would like help to quit smoking, call 1-800-QUIT-NOW 3855543618) OR Espaol: 1-855-Djelo-Ya (2-979-892-1194) o para ms informacin haga clic aqu or Text READY to 200-400 to register via text  Mr. Tamblyn - following are the goals we discussed in your visit today:   Goals Addressed             This Visit's Progress    RNCM - Prostate Issues       Timeframe:  Long-Range Goal Priority:  High Start Date:  03/15/2022                        Expected End Date:  07/20/2022    Patient Goals/Self-Care Activities: Patient will self administer medications as prescribed Patient will attend all scheduled provider appointments Patient will call pharmacy for medication refills Patient will continue to perform ADL's independently Patient will continue to perform IADL's independently Patient will  call provider office for new concerns or questions    Patient will receive ordered MRI and continue to monitor PSA levels.        RNCM: Glycemic Management       Timeframe:  Long-Range Goal Priority:  High Start Date:   07/14/2021                          Expected End Date:  07/20/2022    Patient Goals/Self-Care Activities: Patient will self administer medications as prescribed Patient will attend all scheduled provider appointments Patient will call pharmacy for medication refills Patient will continue to perform ADL's independently Patient will continue to perform IADL's independently Patient will call provider office for new concerns or questions    Patient will monitor daily blood sugars using a Dexacom monitor               RNCM: Hypertension Monitoring & Management       Timeframe:  Long-Range Goal Priority:  High Start Date: 07/14/2021                         Expected End Date: 07/20/2022        Patient Goals/Self-Care Activities: Patient will self administer medications as prescribed Patient will attend all scheduled provider appointments Patient will call pharmacy for medication refills Patient will continue to perform ADL's independently Patient will continue to perform IADL's independently Patient will call  provider office for new concerns or questions   Patient will monitor blood pressure 2 to 3 times per week and record.                  Please see education materials related to today's visit provided by MyChart link.  Patient verbalizes understanding of instructions and care plan provided today and agrees to view in [facility]. Active MyChart status and patient understanding of how to access instructions and care plan via MyChart confirmed with patient.     The Managed Medicaid care management team will reach out to the patient again over the next 60 days.   Salvatore Marvel RN, BSN Community Care Coordinator [facility] Mobile:  [phone]   Following is a copy of your plan of care:  Care Plan : [facility] Plan of Care  Updates made by Inge Rise, RN since 04/12/2022 12:00 AM     Problem: Chronic Disease Management & Care Coordination Needs for DMI & HTN      Long-Range Goal: Development of Plan of Care for Chronic Disease Management & Care Coordination Needs   Start Date: 07/14/2021  Expected End Date: 07/20/2022  Priority: High  Note:   Current Barriers:  Knowledge Deficits related to plan of care for management of HTN and DM I and Prostate Issues Chronic Disease Management support and education needs related to HTN and DM I and Prostate Issues  RNCM Clinical Goal(s):  Patient will verbalize understanding of plan for management of HTN and DM I and Prostate Issues as evidenced by good management of these chronic diseases verbalize basic understanding of HTN and DM I and Prostate Issues disease process and self health management plan as evidenced by good management of these chronic diseases take all medications exactly as prescribed and will call provider for medication related questions as evidenced by patient being medication compliant.    attend all scheduled medical appointments: 03/17/2022 Dr Loanne Drilling; 05/30/2022 PCP Office Visit as evidenced by          demonstrate ongoing adherence to prescribed treatment plan for HTN and DM I and Prostate Issues as evidenced by noted improvement of chronic disease managment of these diseases. demonstrate ongoing health management independence as evidenced by good maintenance of HTN, DM I  and Prostate Issues.        continue to work with Consulting civil engineer and/or Social Worker to address care management and care coordination needs related to HTN and DM I and Prostate Issues as evidenced by adherence to CM Team Scheduled appointments     work with [employer] care guide to address needs related to [employer] of community resource: Corporate investment banker and Applying for  Disability as evidenced by patient and/or community resource care guide support    through collaboration with Consulting civil engineer, provider, and care team.   Interventions: Inter-disciplinary care team collaboration (see longitudinal plan of care) Evaluation of current treatment plan related to  self management and patient's adherence to plan as established by provider 03/22/22: BSW completed telephone Outreach with patient. He stated he was going to a dentist office but they closed. Patient stated he can get dentures every 10 years, he is coming up on 8 years and they broke. BSW will provide patient a list of dentist that accept Medicaid in [facility]. BSW informed patient he would need to call or go to the [facility] office to have his disability started, BSW will provide patient with the telephone number  for SSA. Patient would like for resources to be emailed to [email or web address]. No other resources needed at this time.  04/11/22: BSW completed telephone outreach with patient. He stated he did receive the email of resources and has called some of them. He states he is waiting for a few things and would give the other resources a call today. Patient stated he does not need assistance with anything else and no other resources are needed at this time.   Diabetes:  (Status: Goal on Track (progressing): YES.) Lab Results  Component Value Date   HGBA1C 8.1 (A) 03/17/2022    Lab Results  Component Value Date   HGBA1C 7.6 (A) 12/16/2021    Assessed patient's understanding of A1c goal: <7% Provided education to patient about basic DM disease process; Reviewed medications with patient and discussed importance of medication adherence;        Counseled on importance of regular laboratory monitoring as prescribed;        Discussed plans with patient for ongoing care management follow up and provided patient with direct contact information for care management team;      Reviewed patient's knowledge of  signs & symptoms of hypo and hyperglycemia and importance of correct treatment;       Reviewed scheduled/upcoming provider appointments including: 05/30/2022 PCP office visit; 06/17/2022 Labs; 06/21/2022 Dr Dwyane Dee (New Endocrinologist)       Continue education to patient regarding importance and rationale for checking blood sugars at least 4 times/day and record.  Patient has continuous blood sugar monitor Dexcom for last 2 - 3 months and is knowledgeable about using it.   Patient reports his blood sugar reading after breakfast today was 150. Blood sugar range is between 130 - 180  throughout the day and can occasionally be higher into 200 range during the night. His highest blood sugar reading recently was 220.  Patient states his blood sugars do not remain over 200 and knows that movement throughout the day brings his blood sugars down.  This RN Care Manager provided education on how any exercise or body movement is similar to a shot of insulin and helps reduce blood sugars. Patient verbalized understanding and talked about how blood sugars are directly connected to his food intake.   Patient denies any signs or symptoms of Hyperglycemia but does reports occasional Hypoglycemic episodes when blood sugar drops to 70 or 75.  Patient experiences dizziness with hypoglycemia.  Patient aware of what to do when hypoglycemia occurs. Patient working with BSW for assistance with finding dental group that works with medicaid patients to repair dentures and have added information regarding applying for disability;  Patient reports he has appointments made for both dental and applying for disability.             Hypertension: (Status: Goal on Track (progressing): YES.) Last practice recorded BP readings:  BP Readings from Last 3 Encounters:  03/17/22 130/80  02/28/22 (!) 143/81  12/16/21 (!) 160/80    Most recent eGFR/CrCl:  Lab Results  Component Value Date   EGFR 65 06/02/2021    No components found for:  CRCL  Evaluation of current treatment plan related to hypertension self management and patient's adherence to plan as established by provider;   Reviewed prescribed diet Low Sodium. Patient uses salt substitutes such as Mrs. Dash.  Reviewed medications with patient and discussed importance of compliance;  Counseled on the importance of exercise goals with target of 150 minutes per week Discussed plans with patient for  ongoing care management follow up and provided patient with direct contact information for care management team; Advised patient, providing education and rationale, to monitor blood pressure daily and record, calling PCP for findings outside established parameters;  Reviewed scheduled/upcoming provider appointments including:  05/30/2022 PCP Office Visit; 06/17/2022 Labs; 06/21/2022 Dr. Dwyane Dee (new endocrinologist) Patient reports blood pressure range at home is 110's /80's.  This RN Care Manager provided continued education that patient's blood pressure may be slightly elevated at PCP office visit which is completely normal due to 'white coat syndrome' and home blood pressure readings are important for PCP to see at office visit.    Prostate Issues:  (Status:  New goal.)  Long Term Goal Evaluation of current treatment plan related to  Prostate Issues with elevated PSA level of 12.18 one month ago. , Lacks knowledge of community resource: dental resources for broken dentures and information regarding applying for disability,  self-management and patient's adherence to plan as established by provider. Discussed plans with patient for ongoing care management follow up and provided patient with direct contact information for care management team Evaluation of current treatment plan related to elevated PSA level to 12.18 and patient's adherence to plan as established by provider Reviewed scheduled/upcoming provider appointments including Social Work referral for dental resources for broken  denture and applying for disability. Discussed plans with patient for ongoing care management follow up and provided patient with direct contact information for care management team  Patient awaiting to hear from either Dr. Shann Medal office or [place] regarding ordered MRI of prostate.  Awaiting Medicaid Insurance approval after Dr. Shann Medal office resubmitted required information to insurance for approval.   Patient Goals/Self-Care Activities: Patient will self administer medications as prescribed Patient will attend all scheduled provider appointments Patient will call pharmacy for medication refills Patient will continue to perform ADL's independently Patient will continue to perform IADL's independently Patient will call provider office for new concerns or questions

## 2022-04-19 IMAGING — DX DG CHEST 2V
3 series · 3 of 3 positions shown · non-contrast
Comparison: None.

CLINICAL DATA: Shortness of breath.

EXAM:
CHEST - 2 VIEW

[chest pa (1 of 2)]
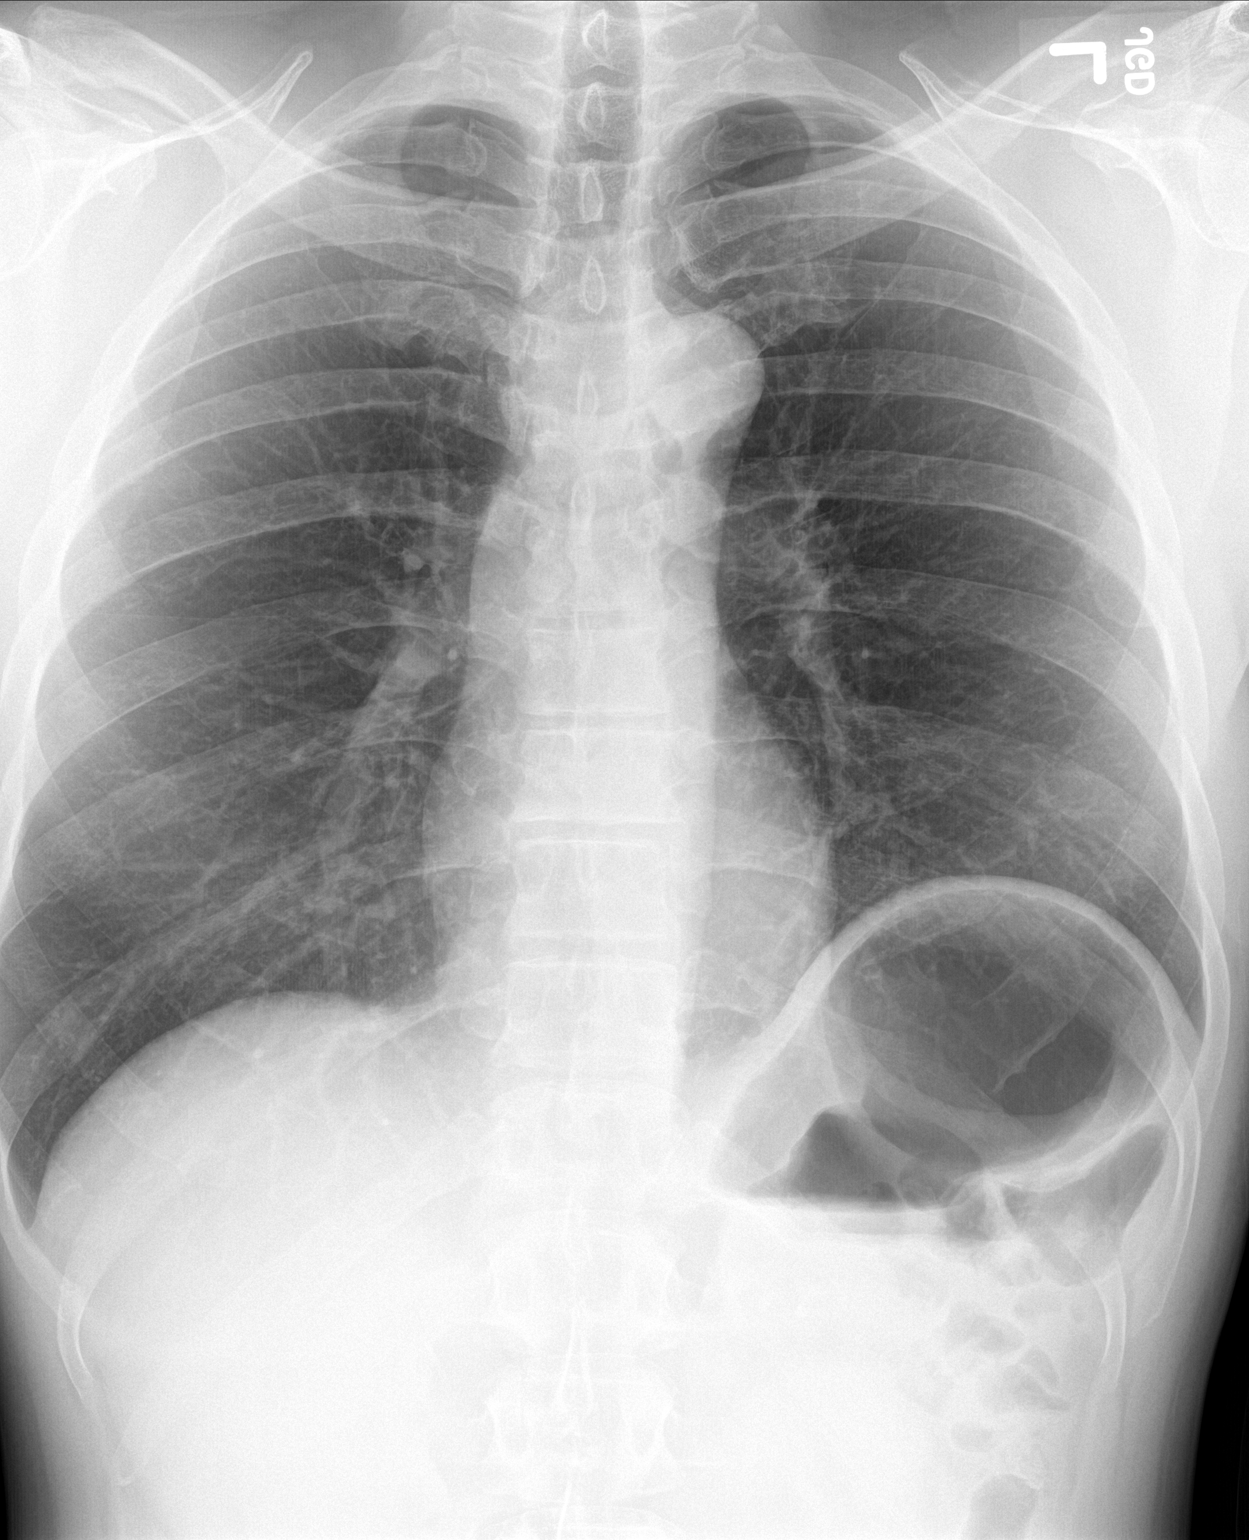

[chest lat]
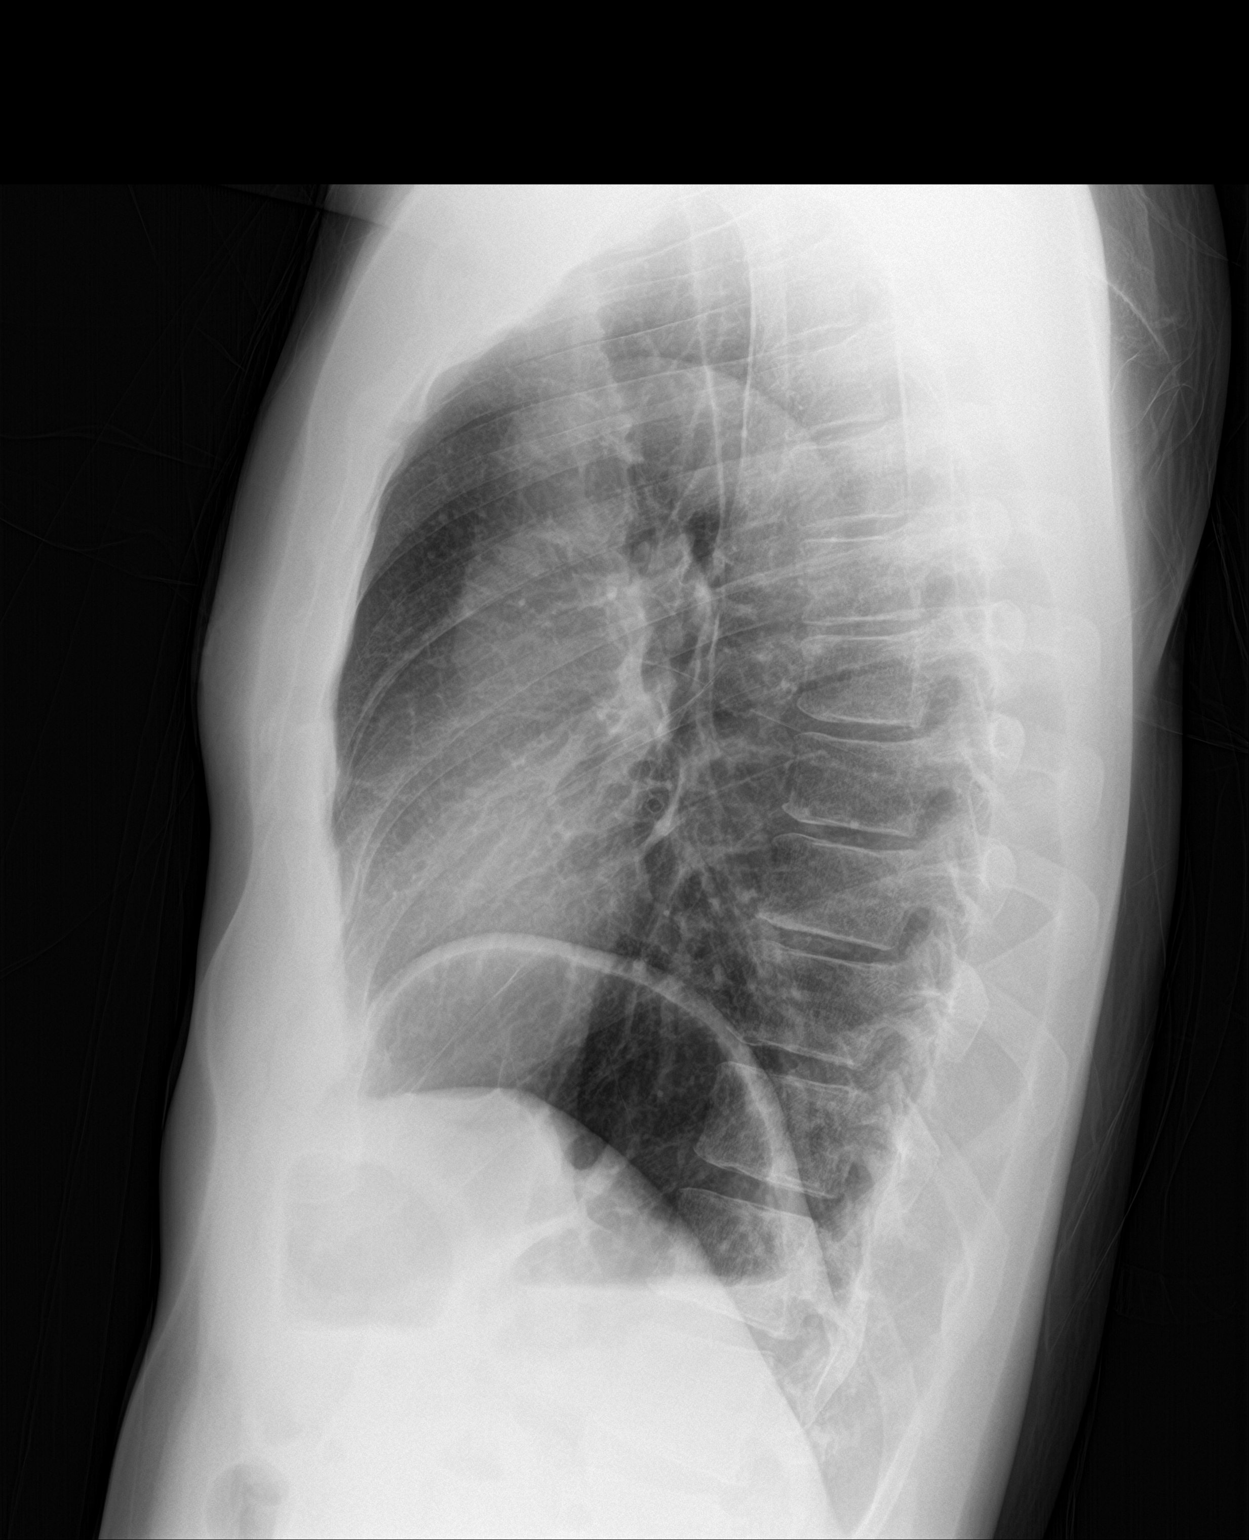

[chest pa (2 of 2)]
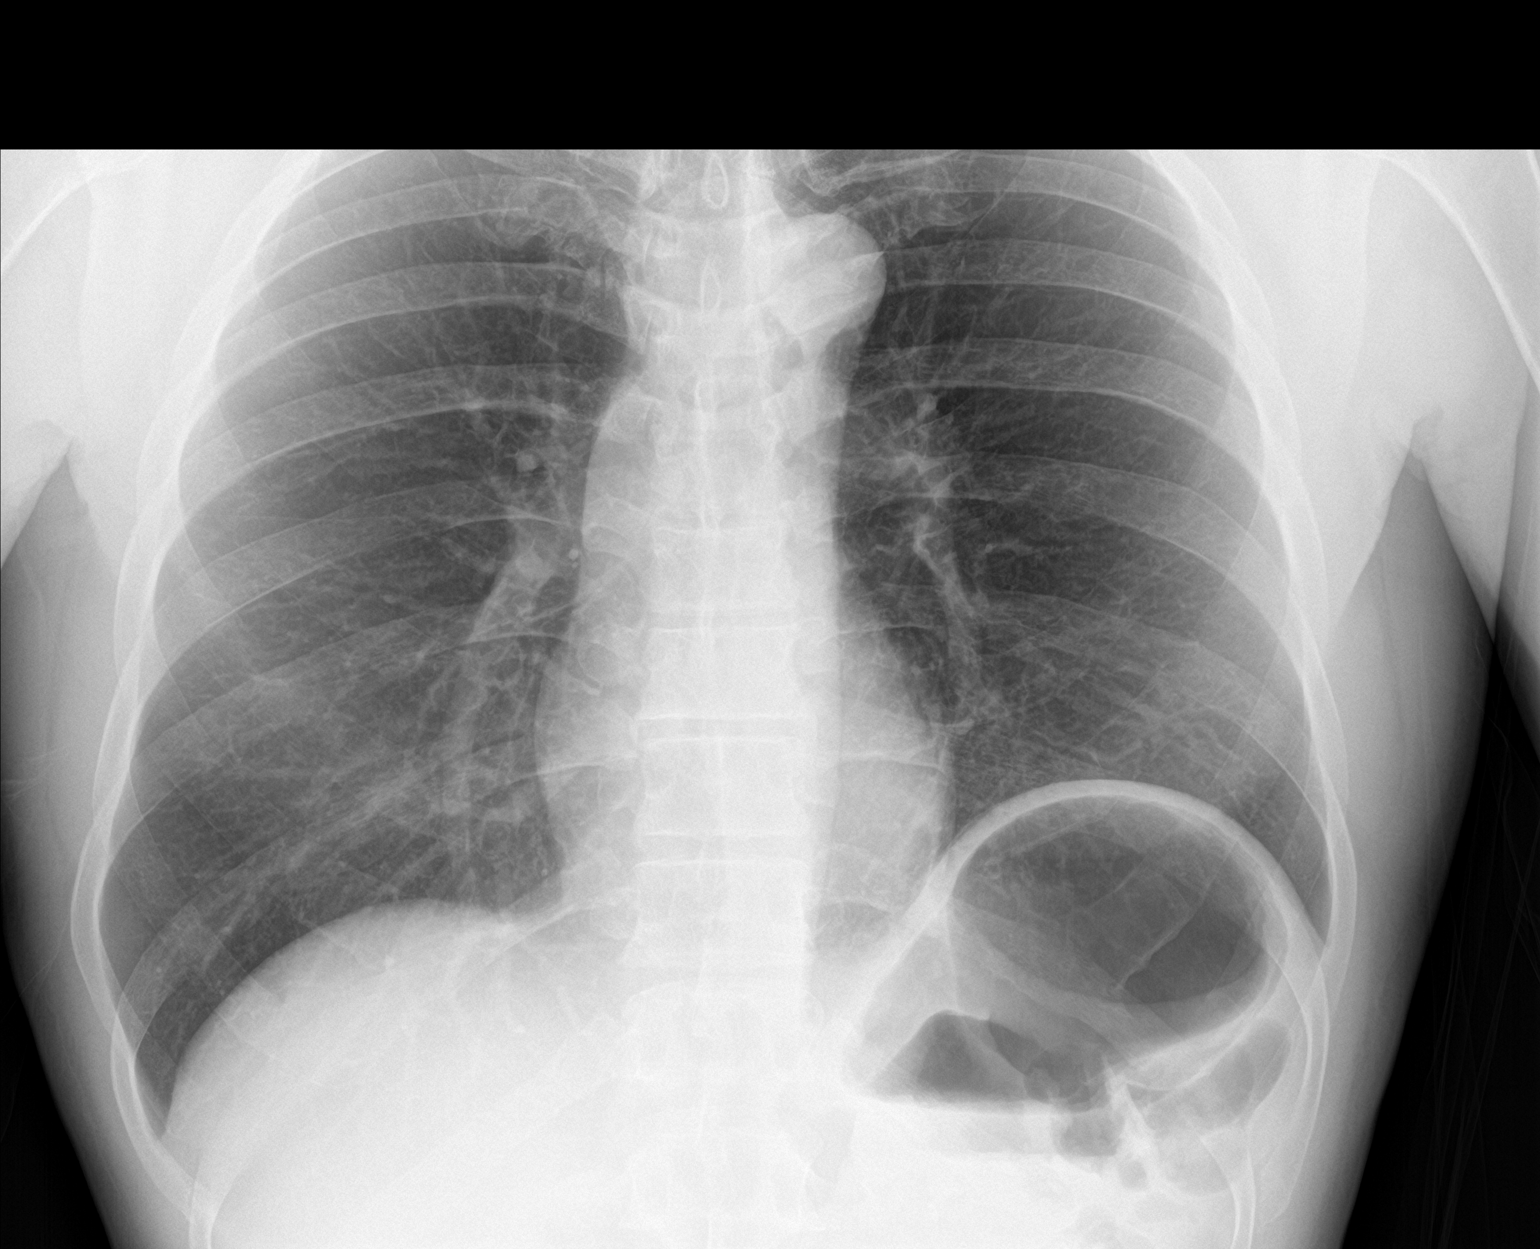

[3 of 3 positions shown; findings below may reference images not displayed]

FINDINGS: The heart size and mediastinal contours are within normal limits.
Both lungs are clear. The visualized skeletal structures are
unremarkable.
IMPRESSION: Negative chest.

## 2022-05-23 NOTE — Progress Notes (Signed)
Patient ID: BURTIS IMHOFF, male    DOB: 1961-06-09  MRN: [id number]  CC: Hypertension Follow-Up  Subjective: Patrick [name] is a 61 y.o. male who presents for hypertension follow-up.   His concerns today include:  Doing well on current regimen. No side effects. No issues/concerns.   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) 03/22/2014   Cirrhosis of liver (HCC) 03/14/2014   Colon cancer screening 03/14/2014   Hypotension, unspecified 03/01/2014   DKA (diabetic ketoacidoses) 02/28/2014   Hyponatremia 02/28/2014   Hepatic encephalopathy (HCC) 08/24/2012   Anemia 08/23/2012   Malnutrition (HCC) 08/23/2012   Acute renal failure (HCC) 08/23/2012   Thrombocytopenia (HCC) 08/23/2012   Coagulopathy (HCC) 08/23/2012   Ascites 08/22/2012   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     Current Outpatient Medications on File Prior to Visit  Medication Sig Dispense Refill   ACCU-CHEK AVIVA PLUS test strip 1 EACH BY OTHER ROUTE 4 (FOUR) TIMES DAILY 100 strip 13   B-D UF III MINI PEN NEEDLES 31G X 5 MM MISC USE 3 TIMES DAILY 300 each 0   Blood Pressure Monitor DEVI Use as directed to check home blood pressure at least 4 times a week. Please notify primary provider of blood pressures greater than 140/90. Please send prescription to [facility].  Phone Number [phone] Fax Number [phone] 1 each 0   cycloSPORINE (RESTASIS) 0.05 % ophthalmic emulsion Place 1 drop into both eyes 2 (two) times daily.     gabapentin (NEURONTIN) 300 MG capsule TAKE 1 CAPSULE BY MOUTH EVERYDAY AT BEDTIME 30 capsule 1   insulin aspart (NOVOLOG FLEXPEN) 100 UNIT/ML FlexPen 4 times a day (just before each meal): 6-6-4-2 units 30 mL 3   insulin glargine (LANTUS SOLOSTAR) 100 UNIT/ML Solostar Pen Inject  8 Units into the skin at bedtime. 15 mL PRN   latanoprost (XALATAN) 0.005 % ophthalmic solution SMARTSIG:1 Drop(s) In Eye(s) Every Evening     Multiple Vitamin (MULTIVITAMIN WITH MINERALS) TABS tablet Take 1 tablet by mouth daily.     sildenafil (VIAGRA) 25 MG tablet Take 1 tablet 1/2 hour to 1 hour prior to intercourse as needed. Limit use to 1/2 tablet or 1 tablet per 24 hours. 20 tablet 0   No current facility-administered medications on file prior to visit.    No Known Allergies  Social History   Socioeconomic History   Marital status: Single    Spouse name: Not on file   Number of children: 1   Years of education: Not on file   Highest education level: Not on file  Occupational History   Occupation: unemployed  Tobacco Use   Smoking status: Former    Packs/day: 0.00    Years: 15.00    Total pack years: 0.00    Types: Cigarettes    Quit date: 07/14/2010    Years since quitting: 11.8    Passive exposure: Past   Smokeless tobacco: Never  Substance and Sexual Activity   Alcohol use: No    Comment: Not using for last 11 years   Drug use: No   Sexual activity: Not on file  Other Topics Concern   Not on file  Social History Narrative   Not on file   Social Determinants of Health   Financial Resource Strain: Low Risk  (07/14/2021)  Overall Financial Resource Strain (CARDIA)    Difficulty of Paying Living Expenses: Not very hard  Food Insecurity: No Food Insecurity (07/14/2021)   Hunger Vital Sign    Worried About Running Out of Food in the Last Year: Never true    Ran Out of Food in the Last Year: Never true  Transportation Needs: No Transportation Needs (07/14/2021)   PRAPARE - Administrator, Civil Service (Medical): No    Lack of Transportation (Non-Medical): No  Physical Activity: Inactive (07/14/2021)   Exercise Vital Sign    Days of Exercise per Week: 0 days    Minutes of Exercise per Session: 0 min  Stress: Stress Concern Present (07/14/2021)    [employer] of Occupational Health - Occupational Stress Questionnaire    Feeling of Stress : To some extent  Social Connections: Unknown (07/14/2021)   Social Connection and Isolation Panel [NHANES]    Frequency of Communication with Friends and Family: More than three times a week    Frequency of Social Gatherings with Friends and Family: More than three times a week    Attends Religious Services: Not on Marketing executive or Organizations: No    Attends Banker Meetings: Never    Marital Status: Living with partner  Intimate Partner Violence: Not on file    Family History  Problem Relation Age of Onset   Heart disease Mother        bi-pass   Hypertension Mother    Cancer Brother        unsure type   Hypertension Father    Colon cancer Neg Hx     Past Surgical History:  Procedure Laterality Date   EYE SURGERY N/A    [employer] 02/21/2021   MOUTH SURGERY     on gums   TONSILLECTOMY      ROS: Review of Systems Negative except as stated above  PHYSICAL EXAM: BP 135/79 (BP Location: Left Arm, Patient Position: Sitting, Cuff Size: Normal)   Pulse (!) 117   Temp 98.3 F (36.8 C)   Resp 16   Ht 5\' 10"  (1.778 m)   Wt 180 lb (81.6 kg)   SpO2 98%   BMI 25.83 kg/m   Physical Exam HENT:     Head: Normocephalic and atraumatic.  Eyes:     Extraocular Movements: Extraocular movements intact.     Conjunctiva/sclera: Conjunctivae normal.     Pupils: Pupils are equal, round, and reactive to light.  Cardiovascular:     Rate and Rhythm: Tachycardia present.     Pulses: Normal pulses.     Heart sounds: Normal heart sounds.  Pulmonary:     Effort: Pulmonary effort is normal.     Breath sounds: Normal breath sounds.  Neurological:     General: No focal deficit present.     Mental Status: He is alert and oriented to person, place, and time.  Psychiatric:        Mood and Affect: Mood normal.        Behavior: Behavior normal.    ASSESSMENT  AND PLAN: 1. Essential (primary) hypertension - Continue Losartan-Hydrochlorothiazide as prescribed.  - Counseled on blood pressure goal of less than 130/80, low-sodium, DASH diet, medication compliance, and 150 minutes of moderate intensity exercise per week as tolerated. Counseled on medication adherence and adverse effects. - Update BMP. - Follow-up with primary provider in 4 months or sooner if needed.  - losartan-hydrochlorothiazide (HYZAAR) 100-25 MG tablet;  Take 1 tablet by mouth daily.  Dispense: 30 tablet; Refill: 3 - Basic Metabolic Panel  Patient was given the opportunity to ask questions.  Patient verbalized understanding of the plan and was able to repeat key elements of the plan. Patient was given clear instructions to go to Emergency Department or return to medical center if symptoms don't improve, worsen, or new problems develop.The patient verbalized understanding.   Orders Placed This Encounter  Procedures   Basic Metabolic Panel    Requested Prescriptions   Signed Prescriptions Disp Refills   losartan-hydrochlorothiazide (HYZAAR) 100-25 MG tablet 30 tablet 3    Sig: Take 1 tablet by mouth daily.    Return in about 4 months (around 09/30/2022) for Follow-Up or next available chronic care.  [name], NP

## 2022-05-30 ENCOUNTER — Ambulatory Visit (INDEPENDENT_AMBULATORY_CARE_PROVIDER_SITE_OTHER): Payer: Medicaid Other | Admitting: Family

## 2022-05-30 VITALS — BP 135/79 | HR 117 | Temp 98.3°F | Resp 16 | Ht 70.0 in | Wt 180.0 lb

## 2022-05-30 DIAGNOSIS — I10 Essential (primary) hypertension: Secondary | ICD-10-CM | POA: Diagnosis not present

## 2022-05-30 MED ORDER — LOSARTAN POTASSIUM-HCTZ 100-25 MG PO TABS: 1.0000 | ORAL_TABLET | Freq: Every day | ORAL | 3 refills | Status: DC

## 2022-05-30 NOTE — Progress Notes (Signed)
.  Pt presents for hypertension f/u   

## 2022-05-31 LAB — BASIC METABOLIC PANEL
BUN/Creatinine Ratio: 13 (ref 10–24)
BUN: 17 mg/dL (ref 8–27)
CO2: 24 mmol/L (ref 20–29)
Calcium: 9.9 mg/dL (ref 8.6–10.2)
Chloride: 98 mmol/L (ref 96–106)
Creatinine, Ser: 1.27 mg/dL (ref 0.76–1.27)
Glucose: 198 mg/dL — ABNORMAL HIGH (ref 70–99)
Potassium: 4.4 mmol/L (ref 3.5–5.2)
Sodium: 140 mmol/L (ref 134–144)
eGFR: 65 mL/min/{1.73_m2} (ref >59)

## 2022-06-07 ENCOUNTER — Ambulatory Visit: Payer: Self-pay

## 2022-06-07 ENCOUNTER — Ambulatory Visit: Payer: Medicaid Other

## 2022-06-11 ENCOUNTER — Other Ambulatory Visit: Payer: Self-pay | Admitting: Internal Medicine

## 2022-06-11 ENCOUNTER — Other Ambulatory Visit: Payer: Self-pay | Admitting: Family

## 2022-06-11 DIAGNOSIS — E1042 Type 1 diabetes mellitus with diabetic polyneuropathy: Secondary | ICD-10-CM

## 2022-06-13 NOTE — Telephone Encounter (Signed)
Requested Prescriptions  Pending Prescriptions Disp Refills  . gabapentin (NEURONTIN) 300 MG capsule [Pharmacy Med Name: GABAPENTIN 300 MG CAPSULE] 30 capsule 1    Sig: TAKE 1 CAPSULE BY MOUTH EVERYDAY AT BEDTIME     Neurology: Anticonvulsants - gabapentin Passed - 06/11/2022  8:56 AM      Passed - Cr in normal range and within 360 days    Creatinine, Ser  Date Value Ref Range Status  05/30/2022 1.27 0.76 - 1.27 mg/dL Final   Creatinine,U  Date Value Ref Range Status  06/11/2014 150.6 mg/dL Final   Creatinine, Urine  Date Value Ref Range Status  08/23/2012 198.50 mg/dL Final         Passed - Completed PHQ-2 or PHQ-9 in the last 360 days      Passed - Valid encounter within last 12 months    Recent Outpatient Visits          2 weeks ago Essential (primary) hypertension   [facility] Care at [facility], Amy J, NP   3 months ago Essential (primary) hypertension   Primary Care at [facility], Amy J, NP   6 months ago Essential hypertension   Primary Care at [facility], Amy J, NP   9 months ago Essential hypertension   Primary Care at [facility], Amy J, NP   1 year ago Essential hypertension   Primary Care at [facility], Salomon Fick, NP      Future Appointments            In 3 months [name], NP Primary Care at [facility]

## 2022-06-16 ENCOUNTER — Other Ambulatory Visit: Payer: Self-pay | Admitting: *Deleted

## 2022-06-16 NOTE — Patient Outreach (Signed)
Medicaid Managed Care   Nurse Care Manager Note  06/16/2022 Name:  Patrick [name] MRN:  [id number] DOB:  11-Apr-1961  Patrick [name] is an 61 y.o. year old male who is a primary patient of [name], NP.  The [facility] Managed Care Coordination team was consulted for assistance with:    HTN DMI  Patrick [name] was given information about Medicaid Managed Care Coordination team services today. Patrick [name] Patient agreed to services and verbal consent obtained.  Engaged with patient by telephone for follow up visit in response to provider referral for case management and/or care coordination services.   Assessments/Interventions:  Review of past medical history, allergies, medications, health status, including review of consultants reports, laboratory and other test data, was performed as part of comprehensive evaluation and provision of chronic care management services.  SDOH (Social Determinants of Health) assessments and interventions performed: SDOH Interventions    Flowsheet Row Most Recent Value  SDOH Interventions   Housing Interventions Intervention Not Indicated  Transportation Interventions Intervention Not Indicated       Care Plan  No Known Allergies  Medications Reviewed Today     Reviewed by Melissa Montane, RN (Registered Nurse) on 06/16/22 at 1053  Med List Status: <None>   Medication Order Taking? Sig Documenting Provider Last Dose Status Informant  ACCU-CHEK AVIVA PLUS test strip [id number]  1 EACH BY OTHER ROUTE 4 (FOUR) TIMES DAILY Renato Shin, MD  Active   B-D UF III MINI PEN NEEDLES 31G X 5 MM MISC [id number]  USE 3 TIMES DAILY [name], Melanie Crazier, MD  Active   Blood Pressure Monitor DEVI [id number]  Use as directed to check home blood pressure at least 4 times a week. Please notify primary provider of blood pressures greater than 140/90. Please send prescription to [facility].  Phone Number [phone] Fax Number [phone] [name], NP  Active   cycloSPORINE (RESTASIS) 0.05 % ophthalmic emulsion [id number] No Place 1 drop into both eyes 2 (two) times daily.  Patient not taking: Reported on 06/16/2022   [name], NP Not Taking Active Self  gabapentin (NEURONTIN) 300 MG capsule [id number] Yes TAKE 1 CAPSULE BY MOUTH EVERYDAY AT BEDTIME [name], NP Taking Active   insulin aspart (NOVOLOG FLEXPEN) 100 UNIT/ML FlexPen [id number] Yes 4 times a day (just before each meal): 6-6-4-2 units Renato Shin, MD Taking Active   insulin glargine (LANTUS SOLOSTAR) 100 UNIT/ML Solostar Pen [id number] Yes Inject 8 Units into the skin at bedtime. Renato Shin, MD Taking Active   latanoprost [name]) 0.005 % ophthalmic solution [id number] Yes SMARTSIG:1 Drop(s) In Eye(s) Every Evening [provider] Taking Active   losartan-hydrochlorothiazide (HYZAAR) 100-25 MG tablet [id number] Yes Take 1 tablet by mouth daily. [name], NP Taking Active   Multiple Vitamin (MULTIVITAMIN WITH MINERALS) TABS tablet [id number] Yes Take 1 tablet by mouth daily. [provider] Taking Active Self  sildenafil (VIAGRA) 25 MG tablet [id number] No Take 1 tablet 1/2 hour to 1 hour prior to intercourse as needed. Limit use to 1/2 tablet or 1 tablet per 24 hours.  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[place]) 03/22/2014   Cirrhosis of liver ([place]) 03/14/2014   Colon cancer screening 03/14/2014   Hypotension, unspecified 03/01/2014   DKA (diabetic ketoacidoses) 02/28/2014   Hyponatremia 02/28/2014   Hepatic encephalopathy ([place]) 08/24/2012   Anemia  08/23/2012   Malnutrition ([place]) 08/23/2012   Acute renal failure ([place]) 08/23/2012   Thrombocytopenia ([place]) 08/23/2012   Coagulopathy ([place]) 08/23/2012   Ascites 67/89/3810   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    Conditions to be addressed/monitored per PCP order:  HTN and DMI  Care Plan : [facility] Plan of Care  Updates made by Melissa Montane, RN since 06/16/2022 12:00 AM     Problem: Chronic Disease Management & Care Coordination Needs for DMI & HTN      Long-Range Goal: Development of Plan of Care for Chronic Disease Management & Care Coordination Needs   Start Date: 07/14/2021  Expected End Date: 07/20/2022  Priority: High  Note:   Current Barriers:  Knowledge Deficits related to plan of care for management of HTN and DM I and Prostate Issues Chronic Disease Management support and education needs related to HTN and DM I and Prostate Issues  RNCM Clinical Goal(s):  Patient will verbalize understanding of plan for management of HTN and DM I and Prostate Issues as evidenced by good management of these chronic diseases verbalize basic understanding of HTN and DM I and Prostate Issues disease process and self health management plan as evidenced by good management of these chronic diseases take all medications exactly as prescribed and will call provider for medication related questions as evidenced by patient being medication compliant.    attend all scheduled medical appointments: 7/28 for labs and 8/1 with Dr. Kumar(Endocrinology) as evidenced by          demonstrate ongoing adherence to prescribed treatment plan for HTN and DM I and Prostate Issues as evidenced by noted improvement of chronic disease managment of these diseases. demonstrate ongoing health management independence as evidenced by good maintenance of HTN, DM I  and Prostate Issues.        continue to work with Consulting civil engineer and/or Social Worker to address care management and care coordination needs related  to HTN and DM I and Prostate Issues as evidenced by adherence to CM Team Scheduled appointments     work with [employer] care guide to address needs related to [employer] of community resource: Corporate investment banker and Applying for Disability as evidenced by patient and/or community resource care guide support    through collaboration with Consulting civil engineer, provider, and care team.   Interventions: Inter-disciplinary care team collaboration (see longitudinal plan of care) Evaluation of current treatment plan related to  self management and patient's adherence to plan as established by provider 03/22/22: BSW completed telephone Outreach with patient. He stated he was going to a dentist office but they closed. Patient stated he can get dentures every 10 years, he is coming up on 8 years and they broke. BSW will provide patient a list of dentist that accept Medicaid in [facility]. BSW informed patient he would need to call or go to the [facility] office to have his disability started, BSW will provide patient with the telephone number for SSA. Patient would like for resources to be emailed to [email or web address]. No other resources needed at this time.  04/11/22: BSW  completed telephone outreach with patient. He stated he did receive the email of resources and has called some of them. He states he is waiting for a few things and would give the other resources a call today. Patient stated he does not need assistance with anything else and no other resources are needed at this time.   Diabetes:  (Status: Goal on Track (progressing): YES.) Lab Results  Component Value Date   HGBA1C 8.1 (A) 03/17/2022    Lab Results  Component Value Date   HGBA1C 8.1 (A) 03/17/2022    Assessed patient's understanding of A1c goal: <7% Provided education to patient about basic DM disease process; Reviewed medications with patient and discussed importance of medication adherence;        Counseled on  importance of regular laboratory monitoring as prescribed;        Discussed plans with patient for ongoing care management follow up and provided patient with direct contact information for care management team;           Reviewed scheduled/upcoming provider appointments including: 06/17/2022 Labs; 06/21/2022 Dr Dwyane Dee (New Endocrinologist)       Advised patient to take CGM and all pieces to Endocrinology to discuss possible upgrade to [place]   Discussed food choices, encouraged patient to include protein with night time snack, examples provided Advised patient to contact [facility] [phone] for denture evaluation            Hypertension: (Status: Goal on Track (progressing): YES.) Last practice recorded BP readings:  BP Readings from Last 3 Encounters:  05/30/22 135/79  03/17/22 130/80  02/28/22 (!) 143/81    Most recent eGFR/CrCl:  Lab Results  Component Value Date   EGFR 65 05/30/2022    No components found for: "CRCL"  Evaluation of current treatment plan related to hypertension self management and patient's adherence to plan as established by provider;   Reviewed medications with patient and discussed importance of compliance;  Counseled on the importance of exercise goals with target of 150 minutes per week Discussed plans with patient for ongoing care management follow up and provided patient with direct contact information for care management team; Advised patient, providing education and rationale, to monitor blood pressure daily and record, calling PCP for findings outside established parameters;  Reviewed scheduled/upcoming provider appointments including:  Advised patient to discuss any concerns or questions with provider; Assessed social determinant of health barriers;  06/17/2022 Labs; 06/21/2022 Dr. Dwyane Dee (new endocrinologist)   Prostate Issues:  (Status:  Condition stable.  Not addressed this visit.)  Long Term Goal Evaluation of current treatment plan related to  Prostate  Issues with elevated PSA level of 12.18 one month ago. , Lacks knowledge of community resource: dental resources for broken dentures and information regarding applying for disability,  self-management and patient's adherence to plan as established by provider. Discussed plans with patient for ongoing care management follow up and provided patient with direct contact information for care management team Evaluation of current treatment plan related to elevated PSA level to 12.18 and patient's adherence to plan as established by provider Reviewed scheduled/upcoming provider appointments including Social Work referral for dental resources for broken denture and applying for disability. Discussed plans with patient for ongoing care management follow up and provided patient with direct contact information for care management team  Patient awaiting to hear from either Dr. Shann Medal office or [place] regarding ordered MRI of prostate.  Awaiting Medicaid Insurance approval after Dr. Shann Medal office resubmitted required information to  insurance for approval.   Patient Goals/Self-Care Activities: Patient will self administer medications as prescribed Patient will attend all scheduled provider appointments Patient will call pharmacy for medication refills Patient will continue to perform ADL's independently Patient will continue to perform IADL's independently Patient will call provider office for new concerns or questions       Follow Up:  Patient agrees to Care Plan and Follow-up.  Plan: The Managed Medicaid care management team will reach out to the patient again over the next 30 days.  Date/time of next scheduled RN care management/care coordination outreach:  07/18/22 @ 10:30am  Lurena Joiner RN, [place] RN Care Coordinator [phone]

## 2022-06-16 NOTE — Patient Instructions (Signed)
Visit Information  Mr. Krasinski was given information about Medicaid Managed Care team care coordination services as a part of their [facility] Medicaid benefit. [name] verbally consented to engagement with the [facility] Managed Care team.   If you are experiencing a medical emergency, please call 911 or report to your local emergency department or urgent care.   If you have a non-emergency medical problem during routine business hours, please contact your provider's office and ask to speak with a nurse.   For questions related to your [facility] health plan, please call: [phone] or go here:[email or web address]  If you would like to schedule transportation through your [facility] plan, please call the following number at least 2 days in advance of your appointment: [phone].  You can also use the MTM portal or MTM mobile app to manage your rides. For the portal, please go to [email or web address].  Call the [place] at [phone], at any time, 24 hours a day, 7 days a week. If you are in danger or need immediate medical attention call 911.  If you would like help to quit smoking, call 1-800-QUIT-NOW (778)646-1914) OR Espaol: 1-855-Djelo-Ya (8-338-250-5397) o para ms informacin haga clic aqu or Text READY to 200-400 to register via text  Mr. Leeson,   Please see education materials related to HTN and DM provided by MyChart link.  Patient verbalizes understanding of instructions and care plan provided today and agrees to view in [place]. Active MyChart status and patient understanding of how to access instructions and care plan via MyChart confirmed with patient.     Telephone follow up appointment with Managed Medicaid care management team member scheduled for:07/18/22 @ 10:30am  Lurena Joiner RN, [place] RN Care Coordinator   Following is a copy of your plan of care:  Care Plan : [facility] Plan of  Care  Updates made by Melissa Montane, RN since 06/16/2022 12:00 AM     Problem: Chronic Disease Management & Care Coordination Needs for DMI & HTN      Long-Range Goal: Development of Plan of Care for Chronic Disease Management & Care Coordination Needs   Start Date: 07/14/2021  Expected End Date: 07/20/2022  Priority: High  Note:   Current Barriers:  Knowledge Deficits related to plan of care for management of HTN and DM I and Prostate Issues Chronic Disease Management support and education needs related to HTN and DM I and Prostate Issues  RNCM Clinical Goal(s):  Patient will verbalize understanding of plan for management of HTN and DM I and Prostate Issues as evidenced by good management of these chronic diseases verbalize basic understanding of HTN and DM I and Prostate Issues disease process and self health management plan as evidenced by good management of these chronic diseases take all medications exactly as prescribed and will call provider for medication related questions as evidenced by patient being medication compliant.    attend all scheduled medical appointments: 7/28 for labs and 8/1 with Dr. Kumar(Endocrinology) as evidenced by          demonstrate ongoing adherence to prescribed treatment plan for HTN and DM I and Prostate Issues as evidenced by noted improvement of chronic disease managment of these diseases. demonstrate ongoing health management independence as evidenced by good maintenance of HTN, DM I  and Prostate Issues.        continue to work with Consulting civil engineer and/or Social Worker to address care management and care coordination needs  related to HTN and DM I and Prostate Issues as evidenced by adherence to CM Team Scheduled appointments     work with [employer] care guide to address needs related to [employer] of community resource: Corporate investment banker and Applying for Disability as evidenced by patient and/or community resource care guide support     through collaboration with Consulting civil engineer, provider, and care team.   Interventions: Inter-disciplinary care team collaboration (see longitudinal plan of care) Evaluation of current treatment plan related to  self management and patient's adherence to plan as established by provider 03/22/22: BSW completed telephone Outreach with patient. He stated he was going to a dentist office but they closed. Patient stated he can get dentures every 10 years, he is coming up on 8 years and they broke. BSW will provide patient a list of dentist that accept Medicaid in [facility]. BSW informed patient he would need to call or go to the [facility] office to have his disability started, BSW will provide patient with the telephone number for SSA. Patient would like for resources to be emailed to [email or web address]. No other resources needed at this time.  04/11/22: BSW completed telephone outreach with patient. He stated he did receive the email of resources and has called some of them. He states he is waiting for a few things and would give the other resources a call today. Patient stated he does not need assistance with anything else and no other resources are needed at this time.   Diabetes:  (Status: Goal on Track (progressing): YES.) Lab Results  Component Value Date   HGBA1C 8.1 (A) 03/17/2022    Lab Results  Component Value Date   HGBA1C 8.1 (A) 03/17/2022    Assessed patient's understanding of A1c goal: <7% Provided education to patient about basic DM disease process; Reviewed medications with patient and discussed importance of medication adherence;        Counseled on importance of regular laboratory monitoring as prescribed;        Discussed plans with patient for ongoing care management follow up and provided patient with direct contact information for care management team;           Reviewed scheduled/upcoming provider appointments including: 06/17/2022 Labs; 06/21/2022 Dr Dwyane Dee (New  Endocrinologist)       Advised patient to take CGM and all pieces to Endocrinology to discuss possible upgrade to [place]   Discussed food choices, encouraged patient to include protein with night time snack, examples provided Advised patient to contact [facility] [phone] for denture evaluation            Hypertension: (Status: Goal on Track (progressing): YES.) Last practice recorded BP readings:  BP Readings from Last 3 Encounters:  05/30/22 135/79  03/17/22 130/80  02/28/22 (!) 143/81    Most recent eGFR/CrCl:  Lab Results  Component Value Date   EGFR 65 05/30/2022    No components found for: "CRCL"  Evaluation of current treatment plan related to hypertension self management and patient's adherence to plan as established by provider;   Reviewed medications with patient and discussed importance of compliance;  Counseled on the importance of exercise goals with target of 150 minutes per week Discussed plans with patient for ongoing care management follow up and provided patient with direct contact information for care management team; Advised patient, providing education and rationale, to monitor blood pressure daily and record, calling PCP for findings outside established parameters;  Reviewed scheduled/upcoming provider appointments including:  Advised patient  to discuss any concerns or questions with provider; Assessed social determinant of health barriers;  06/17/2022 Labs; 06/21/2022 Dr. Dwyane Dee (new endocrinologist)   Prostate Issues:  (Status:  Condition stable.  Not addressed this visit.)  Long Term Goal Evaluation of current treatment plan related to  Prostate Issues with elevated PSA level of 12.18 one month ago. , Lacks knowledge of community resource: dental resources for broken dentures and information regarding applying for disability,  self-management and patient's adherence to plan as established by provider. Discussed plans with patient for ongoing care management  follow up and provided patient with direct contact information for care management team Evaluation of current treatment plan related to elevated PSA level to 12.18 and patient's adherence to plan as established by provider Reviewed scheduled/upcoming provider appointments including Social Work referral for dental resources for broken denture and applying for disability. Discussed plans with patient for ongoing care management follow up and provided patient with direct contact information for care management team  Patient awaiting to hear from either Dr. Shann Medal office or [place] regarding ordered MRI of prostate.  Awaiting Medicaid Insurance approval after Dr. Shann Medal office resubmitted required information to insurance for approval.   Patient Goals/Self-Care Activities: Patient will self administer medications as prescribed Patient will attend all scheduled provider appointments Patient will call pharmacy for medication refills Patient will continue to perform ADL's independently Patient will continue to perform IADL's independently Patient will call provider office for new concerns or questions

## 2022-06-17 ENCOUNTER — Telehealth: Payer: Self-pay

## 2022-06-17 ENCOUNTER — Other Ambulatory Visit (INDEPENDENT_AMBULATORY_CARE_PROVIDER_SITE_OTHER): Payer: Medicaid Other

## 2022-06-17 ENCOUNTER — Telehealth: Payer: Self-pay | Admitting: Endocrinology

## 2022-06-17 ENCOUNTER — Other Ambulatory Visit: Payer: Self-pay | Admitting: Endocrinology

## 2022-06-17 DIAGNOSIS — E1042 Type 1 diabetes mellitus with diabetic polyneuropathy: Secondary | ICD-10-CM

## 2022-06-17 LAB — COMPREHENSIVE METABOLIC PANEL
ALT: 21 U/L (ref 0–53)
AST: 20 U/L (ref 0–37)
Albumin: 4.6 g/dL (ref 3.5–5.2)
Alkaline Phosphatase: 71 U/L (ref 39–117)
BUN: 23 mg/dL (ref 6–23)
CO2: 31 mEq/L (ref 19–32)
Calcium: 9.8 mg/dL (ref 8.4–10.5)
Chloride: 97 mEq/L (ref 96–112)
Creatinine, Ser: 1.38 mg/dL (ref 0.40–1.50)
GFR: 55.76 mL/min — ABNORMAL LOW (ref >60.00)
Glucose, Bld: 128 mg/dL — ABNORMAL HIGH (ref 70–99)
Potassium: 4.2 mEq/L (ref 3.5–5.1)
Sodium: 137 mEq/L (ref 135–145)
Total Bilirubin: 0.5 mg/dL (ref 0.2–1.2)
Total Protein: 8.1 g/dL (ref 6.0–8.3)

## 2022-06-17 LAB — HEMOGLOBIN A1C: Hgb A1c MFr Bld: 7.8 % — ABNORMAL HIGH (ref 4.6–6.5)

## 2022-06-17 LAB — LIPID PANEL
Cholesterol: 196 mg/dL (ref 0–200)
HDL: 30.2 mg/dL — ABNORMAL LOW (ref >39.00)
NonHDL: 165.88
Total CHOL/HDL Ratio: 6
Triglycerides: 303 mg/dL — ABNORMAL HIGH (ref 0.0–149.0)
VLDL: 60.6 mg/dL — ABNORMAL HIGH (ref 0.0–40.0)

## 2022-06-17 LAB — MICROALBUMIN / CREATININE URINE RATIO
Creatinine,U: 96.8 mg/dL
Microalb Creat Ratio: 0.7 mg/g (ref 0.0–30.0)
Microalb, Ur: <0.7 mg/dL (ref 0.0–1.9)

## 2022-06-17 LAB — LDL CHOLESTEROL, DIRECT: Direct LDL: 113 mg/dL

## 2022-06-17 MED ORDER — DEXCOM G7 RECEIVER DEVI: 0 refills | Status: DC

## 2022-06-17 MED ORDER — DEXCOM G7 SENSOR MISC: 2 refills | Status: DC

## 2022-06-17 NOTE — Telephone Encounter (Signed)
Patient came by office. States his Dexcom G6 receiver dropped in bathtub and doesn't work. Wants to know if he can be upgraded to G7. Insurance will not cover another G6 receiver right now only every 5 years. Is it ok to upgrade patient? We do have G7 receiver and sensors in office.

## 2022-06-20 NOTE — Telephone Encounter (Signed)
Error

## 2022-06-21 ENCOUNTER — Encounter: Payer: Self-pay | Admitting: Endocrinology

## 2022-06-21 ENCOUNTER — Ambulatory Visit (INDEPENDENT_AMBULATORY_CARE_PROVIDER_SITE_OTHER): Payer: Medicaid Other | Admitting: Endocrinology

## 2022-06-21 VITALS — BP 122/78 | HR 104 | Ht 71.0 in | Wt 178.8 lb

## 2022-06-21 DIAGNOSIS — I10 Essential (primary) hypertension: Secondary | ICD-10-CM | POA: Diagnosis not present

## 2022-06-21 DIAGNOSIS — E782 Mixed hyperlipidemia: Secondary | ICD-10-CM | POA: Diagnosis not present

## 2022-06-21 DIAGNOSIS — E1065 Type 1 diabetes mellitus with hyperglycemia: Secondary | ICD-10-CM | POA: Diagnosis not present

## 2022-06-21 MED ORDER — SIMVASTATIN 20 MG PO TABS: 20.0000 mg | ORAL_TABLET | Freq: Every day | ORAL | 1 refills | Status: DC

## 2022-06-21 MED ORDER — METFORMIN HCL ER 500 MG PO TB24: 1500.0000 mg | ORAL_TABLET | Freq: Every day | ORAL | 3 refills | Status: DC

## 2022-06-21 NOTE — Patient Instructions (Addendum)
Novolog 7 in am, 4 at lunch and 2-3 at supper and none at bed  Lantus 9 and keep am sugar 80-120  Start taking Metformin 500 mg, 1 tablet with your main meal for 5 days. Occasionally this may initially cause loose stools or nausea. If  tolerating well after 5 days add a second Metformin tablet (500 mg) at the same time. Continue adding another tablet after 5 days days if no persistent nausea or diarrhea until reaching the maximum tolerated dose or the full dose of 3 tablets once daily.

## 2022-06-21 NOTE — Progress Notes (Unsigned)
Patient ID: Patrick [name], male   DOB: 22-Aug-1961, 61 y.o.   MRN: [id number]           Reason for Appointment: Type II Diabetes follow-up   History of Present Illness   Diagnosis date: 2015 2015  Previous history:  Non-insulin hypoglycemic drugs previously used: Unknown Insulin was started in 2015, however was off insulin between 2018 and 2020  A1c range in the last few years is: 6.3-8.1  Recent history:     Non-insulin hypoglycemic drugs: None     Insulin regimen: NovoLog 6 units with breakfast and lunch, for dinner and 2 units at bedtime Lantus 8 units at bedtime    Side effects from medications: None  Current self management, blood sugar patterns and problems identified:  A1c is 7.8 and about the same this year He takes insulin with every meal and also NovoLog at bedtime when he has a small snack with some protein As seen in the Dexcom analysis below his blood sugars are mostly higher in the mornings despite increasing his Lantus by 2 units in the last visit However he tends to have lower readings after dinner and after his bedtime NovoLog at times low normal Tends to have relatively lower carbohydrate intake at dinnertime He is walking either morning or  Exercise: Evening for about 30 min Diet management: Breakfast usually egg, potatoes or other starch, meat     Hypoglycemia: Minimal recently  Dexcom CGM analysis for the last 3 days + shows the following interpretation  Blood sugars are mostly at the upper end of the target range except during the morning hours and late evening with some fluctuation Overnight blood sugars are relatively higher overall and appear to be higher around 3 AM when the average blood sugar is 172. No hypoglycemia at any time with only once transiently low sugar at 4 PM POSTPRANDIAL readings are appearing fairly consistently controlled with no significant high readings Premeal readings appear to be relatively higher around lunchtime and  averaging about 140 before dinner HIGHEST average blood sugar is overnight Time in target is 89% with AVERAGE glucose 146 and GMI 6.8 No hypoglycemia  Dietician visit: Most recent: Years ago     Weight control:  Wt Readings from Last 3 Encounters:  06/21/22 178 lb 12.8 oz (81.1 kg)  05/30/22 180 lb (81.6 kg)  03/17/22 180 lb 9.6 oz (81.9 kg)            Diabetes labs:  Lab Results  Component Value Date   HGBA1C 7.8 (H) 06/17/2022   HGBA1C 8.1 (A) 03/17/2022   HGBA1C 7.6 (A) 12/16/2021   Lab Results  Component Value Date   MICROALBUR <0.7 06/17/2022   LDLCALC 105 (H) 03/01/2014   CREATININE 1.38 06/17/2022    No results found for: "FRUCTOSAMINE"   Allergies as of 06/21/2022   No Known Allergies      Medication List        Accurate as of June 21, 2022 11:59 PM. If you have any questions, ask your nurse or doctor.          Accu-Chek Aviva Plus test strip Generic drug: glucose blood 1 EACH BY OTHER ROUTE 4 (FOUR) TIMES DAILY   B-D UF III MINI PEN NEEDLES 31G X 5 MM Misc Generic drug: Insulin Pen Needle USE 3 TIMES DAILY   Blood Pressure Monitor Devi Use as directed to check home blood pressure at least 4 times a week. Please notify primary provider of blood pressures greater  than 140/90. Please send prescription to [facility].  Phone Number [phone] Fax Number [phone]   cycloSPORINE 0.05 % ophthalmic emulsion Commonly known as: RESTASIS Place 1 drop into both eyes 2 (two) times daily.   Dexcom G7 Receiver Devi Use to check blood sugar daily   Dexcom G7 Sensor Misc Change every 10 days   gabapentin 300 MG capsule Commonly known as: NEURONTIN TAKE 1 CAPSULE BY MOUTH EVERYDAY AT BEDTIME   Lantus SoloStar 100 UNIT/ML Solostar Pen Generic drug: insulin glargine Inject 8 Units into the skin at bedtime.   latanoprost 0.005 % ophthalmic solution Commonly known as: XALATAN SMARTSIG:1 Drop(s) In Eye(s) Every Evening    losartan-hydrochlorothiazide 100-25 MG tablet Commonly known as: HYZAAR Take 1 tablet by mouth daily.   metFORMIN 500 MG 24 hr tablet Commonly known as: GLUCOPHAGE-XR Take 3 tablets (1,500 mg total) by mouth daily with supper. Started by: Reather Littler, MD   multivitamin with minerals Tabs tablet Take 1 tablet by mouth daily.   NovoLOG FlexPen 100 UNIT/ML FlexPen Generic drug: insulin aspart 4 times a day (just before each meal): 6-6-4-2 units   sildenafil 25 MG tablet Commonly known as: VIAGRA Take 1 tablet 1/2 hour to 1 hour prior to intercourse as needed. Limit use to 1/2 tablet or 1 tablet per 24 hours.   simvastatin 20 MG tablet Commonly known as: ZOCOR Take 1 tablet (20 mg total) by mouth at bedtime. Started by: Reather Littler, MD        Allergies: No Known Allergies  Past Medical History:  Diagnosis Date   Cirrhosis (HCC)    Diabetes (HCC)    type 1   Diabetes mellitus without complication (HCC)    [employer] 02/21/2021   ETOH abuse     Past Surgical History:  Procedure Laterality Date   EYE SURGERY N/A    [employer] 02/21/2021   MOUTH SURGERY     on gums   TONSILLECTOMY      Family History  Problem Relation Age of Onset   Heart disease Mother        bi-pass   Hypertension Mother    Cancer Brother        unsure type   Hypertension Father    Colon cancer Neg Hx     Social History:  reports that he quit smoking about 11 years ago. His smoking use included cigarettes. He has been exposed to tobacco smoke. He has never used smokeless tobacco. He reports that he does not drink alcohol and does not use drugs.  Review of Systems:  Last diabetic eye exam date 9/22  Last foot exam date: 10/22  Symptoms of neuropathy:  Hypertension:   Treatment includes  BP Readings from Last 3 Encounters:  06/21/22 122/78  05/30/22 135/79  03/17/22 130/80    Lipid management:    Lab Results  Component Value Date   CHOL 196 06/17/2022   CHOL 155 03/01/2014    Lab Results  Component Value Date   HDL 30.20 (L) 06/17/2022   HDL 30 (L) 03/01/2014   Lab Results  Component Value Date   LDLCALC 105 (H) 03/01/2014   Lab Results  Component Value Date   TRIG 303.0 (H) 06/17/2022   TRIG 101 03/01/2014   Lab Results  Component Value Date   CHOLHDL 6 06/17/2022   CHOLHDL 5.2 03/01/2014   Lab Results  Component Value Date   LDLDIRECT 113.0 06/17/2022     Examination:   BP 122/78   Pulse Marland Kitchen)  104   Ht 5\' 11"  (1.803 m)   Wt 178 lb 12.8 oz (81.1 kg)   SpO2 96%   BMI 24.94 kg/m   Body mass index is 24.94 kg/m.    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  Current regimen: Low-dose basal bolus insulin  See history of present illness for detailed discussion of current diabetes management, blood sugar patterns and problems identified  A1c is 7.8  Blood glucose control is recently fairly good although A1c is higher than his recent blood sugar seen on the Dexcom He appears to be having the highest readings overnight For this reason discussed that he may benefit from metformin and he is asking about oral medications; he has not always been on insulin since the time of diagnosis   Recommendations:  Trial of METFORMIN ER starting with 500 mg and maximum dose of 3 tablets with weekly titration depending on his tolerance Take this with food Go up to 9 units of Lantus and if morning sugars are still consistently over 130 go up to 10 Take 7 units at breakfast of NovoLog, 4 at lunch and 2-3 at dinnertime Does not need to take NovoLog for bedtime snack unless eating more carbohydrate Continue to use the Dexcom Exercise 5 days a week and watch blood sugar before and after exercise   HYPERTENSION: Appears well controlled Continue follow-up with PCP No microalbuminuria  Mild hypercholesterolemia: Discussed cardiovascular risk reduction with his diabetes and hypertension His diet is generally fairly good He does need to be on a statin and  discussed benefits and possible side effects He will start with simvastatin 20 mg daily and follow-up again on the next visit  Patient Instructions  Novolog 7 in am, 4 at lunch and 2-3 at supper and none at bed  Lantus 9 and keep am sugar 80-120  Start taking Metformin 500 mg, 1 tablet with your main meal for 5 days. Occasionally this may initially cause loose stools or nausea. If  tolerating well after 5 days add a second Metformin tablet (500 mg) at the same time. Continue adding another tablet after 5 days days if no persistent nausea or diarrhea until reaching the maximum tolerated dose or the full dose of 3 tablets once daily.    Total visit time for total evaluation and management of multiple problems, review of extensive relevant medical records and counseling = 45 minutes  Lanea Vankirk 06/22/2022, 3:10 PM

## 2022-06-30 ENCOUNTER — Encounter: Payer: Self-pay | Admitting: Endocrinology

## 2022-07-13 ENCOUNTER — Other Ambulatory Visit: Payer: Self-pay | Admitting: Endocrinology

## 2022-07-18 ENCOUNTER — Other Ambulatory Visit: Payer: Self-pay | Admitting: *Deleted

## 2022-07-18 NOTE — Patient Outreach (Signed)
Medicaid Managed Care   Nurse Care Manager Note  07/18/2022 Name:  Patrick [name] MRN:  [id number] DOB:  01-Feb-1961  Patrick [name] is an 61 y.o. year old male who is a primary patient of [name], NP.  The [facility] Managed Care Coordination team was consulted for assistance with:    DMI  Mr. [name] was given information about Medicaid Managed Care Coordination team services today. Patrick [name] Patient agreed to services and verbal consent obtained.  Engaged with patient by telephone for follow up visit in response to provider referral for case management and/or care coordination services.   Assessments/Interventions:  Review of past medical history, allergies, medications, health status, including review of consultants reports, laboratory and other test data, was performed as part of comprehensive evaluation and provision of chronic care management services.  SDOH (Social Determinants of Health) assessments and interventions performed: SDOH Interventions    Flowsheet Row Most Recent Value  SDOH Interventions   Physical Activity Interventions Intervention Not Indicated       Care Plan  No Known Allergies  Medications Reviewed Today     Reviewed by Melissa Montane, RN (Registered Nurse) on 07/18/22 at 51  Med List Status: <None>   Medication Order Taking? Sig Documenting Provider Last Dose Status Informant  ACCU-CHEK AVIVA PLUS test strip [id number] Yes 1 EACH BY OTHER ROUTE 4 (FOUR) TIMES DAILY Renato Shin, MD Taking Active   B-D UF III MINI PEN NEEDLES 31G X 5 MM MISC [id number] Yes USE 3 TIMES DAILY [name], [name], MD Taking Active   Blood Pressure Monitor DEVI [id number] Yes Use as directed to check home blood pressure at least 4 times a week. Please notify primary provider of blood pressures greater than 140/90. Please send prescription to [facility].  Phone Number [phone] Fax Number [phone] [name], NP Taking Active    Continuous Blood Gluc Receiver ([place]) [place] [id number] Yes Use to check blood sugar daily [name], MD Taking Active   Continuous Blood Gluc Sensor ([place]) [name] [id number] Yes Change every 10 days [name], MD Taking Active   cycloSPORINE (RESTASIS) 0.05 % ophthalmic emulsion [id number] Yes Place 1 drop into both eyes 2 (two) times daily. [name], NP Taking Active Self  gabapentin (NEURONTIN) 300 MG capsule [id number] Yes TAKE 1 CAPSULE BY MOUTH EVERYDAY AT BEDTIME [name], NP Taking Active   insulin aspart (NOVOLOG FLEXPEN) 100 UNIT/ML FlexPen [id number] Yes 4 times a day (just before each meal): 6-6-4-2 units Renato Shin, MD Taking Active   insulin glargine (LANTUS SOLOSTAR) 100 UNIT/ML Solostar Pen [id number] Yes Inject 8 Units into the skin at bedtime. Renato Shin, MD Taking Active   latanoprost [name]) 0.005 % ophthalmic solution [id number] Yes SMARTSIG:1 Drop(s) In Eye(s) Every Evening [provider] Taking Active   losartan-hydrochlorothiazide (HYZAAR) 100-25 MG tablet [id number] Yes Take 1 tablet by mouth daily. [name], NP Taking Active   metFORMIN (GLUCOPHAGE-XR) 500 MG 24 hr tablet [id number] Yes Take 3 tablets (1,500 mg total) by mouth daily with supper. [name], MD Taking Active   Multiple Vitamin (MULTIVITAMIN WITH MINERALS) TABS tablet [id number] Yes Take 1 tablet by mouth daily. [provider] Taking Active Self  sildenafil (VIAGRA) 25 MG tablet [id number] No Take 1 tablet 1/2 hour to 1 hour prior to intercourse as needed. Limit use to 1/2 tablet or 1 tablet per 24 hours.  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[place]) 03/22/2014   Cirrhosis of liver ([name]) 03/14/2014   Colon cancer screening 03/14/2014   Hypotension, unspecified 03/01/2014   DKA (diabetic ketoacidoses) 02/28/2014   Hyponatremia 02/28/2014   Hepatic encephalopathy ([name]) 08/24/2012   Anemia 08/23/2012   Malnutrition ([name]) 08/23/2012   Acute renal failure ([name]) 08/23/2012   Thrombocytopenia ([name]) 08/23/2012   Coagulopathy ([place]) 08/23/2012   Ascites 94/49/6759   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    Conditions to be addressed/monitored per PCP order:   DMI  Care Plan : [facility] Plan of Care  Updates made by Melissa Montane, RN since 07/18/2022 12:00 AM     Problem: Chronic Disease Management & Care Coordination Needs for DMI & HTN      Long-Range Goal: Development of Plan of Care for Chronic Disease Management & Care Coordination Needs   Start Date: 07/14/2021  Expected End Date: 08/19/2022  Priority: High  Note:   Current Barriers:  Knowledge Deficits related to plan of care for management of HTN and DM I and Prostate Issues Chronic Disease Management support and education needs related to HTN and DM I and Prostate Issues  RNCM Clinical Goal(s):  Patient will verbalize understanding of plan for management of HTN and DM I and Prostate Issues as evidenced by good management of these chronic diseases verbalize basic understanding of HTN and DM I and Prostate Issues disease process and self health management plan as evidenced by good management of these chronic diseases take all medications exactly as prescribed and will call provider for medication related questions as evidenced by patient being medication compliant.    attend all scheduled medical appointments: 7/28 for labs and 8/1 with Dr.  Kumar(Endocrinology) as evidenced by          demonstrate ongoing adherence to prescribed treatment plan for HTN and DM I and Prostate Issues as evidenced by noted improvement of chronic disease managment of these diseases. demonstrate ongoing health management independence as evidenced by good maintenance of HTN, DM I  and Prostate Issues.        continue to work with Consulting civil engineer and/or Social Worker to address care management and care coordination needs related to HTN and DM I and Prostate Issues as evidenced by adherence to CM Team Scheduled appointments     work with [employer] care guide to address needs related to [employer] of community resource: Corporate investment banker and Applying for Disability as evidenced by patient and/or community resource care guide support    through collaboration with Consulting civil engineer, provider, and care team.   Interventions: Inter-disciplinary care team collaboration (see longitudinal plan of care) Evaluation of current treatment plan related to  self management and patient's adherence to plan as established by provider 03/22/22: BSW completed telephone Outreach with patient. He stated he was going to a dentist office but they closed. Patient stated he can get  dentures every 10 years, he is coming up on 8 years and they broke. BSW will provide patient a list of dentist that accept Medicaid in [facility]. BSW informed patient he would need to call or go to the [facility] office to have his disability started, BSW will provide patient with the telephone number for SSA. Patient would like for resources to be emailed to [email or web address]. No other resources needed at this time.  04/11/22: BSW completed telephone outreach with patient. He stated he did receive the email of resources and has called some of them. He states he is waiting for a few things and would give the other resources a call today. Patient stated he does not need assistance with anything  else and no other resources are needed at this time.   Diabetes:  (Status: Goal on Track (progressing): YES.) Lab Results  Component Value Date   HGBA1C 7.8 (H) 06/17/2022    Lab Results  Component Value Date   HGBA1C 7.8 (H) 06/17/2022    Assessed patient's understanding of A1c goal: <7% Provided education to patient about basic DM disease process; Reviewed medications with patient and discussed importance of medication adherence;        Counseled on importance of regular laboratory monitoring as prescribed;        Discussed plans with patient for ongoing care management follow up and provided patient with direct contact information for care management team;           Reviewed scheduled/upcoming provider appointments including:09/07/22 with Dr. Dwyane Dee and 09/26/22 with PCP         Discussed food choices, encouraged patient to include protein with night time snack, examples provided Reviewed provider documentation, discussed with patient, assisted him with finding insulin recommendations in [place] patient to adjust insulin dose as recommended            Hypertension: (Status: Goal Met.) Last practice recorded BP readings:  BP Readings from Last 3 Encounters:  06/21/22 122/78  05/30/22 135/79  03/17/22 130/80    Most recent eGFR/CrCl:  Lab Results  Component Value Date   EGFR 65 05/30/2022    No components found for: "CRCL"  Evaluation of current treatment plan related to hypertension self management and patient's adherence to plan as established by provider;   Reviewed medications with patient and discussed importance of compliance;  Counseled on the importance of exercise goals with target of 150 minutes per week Discussed plans with patient for ongoing care management follow up and provided patient with direct contact information for care management team; Advised patient, providing education and rationale, to monitor blood pressure daily and record, calling PCP for  findings outside established parameters;  Reviewed scheduled/upcoming provider appointments including:  Advised patient to discuss any concerns or questions with provider; Assessed social determinant of health barriers;     Prostate Issues:  (Status:  Condition stable.  Not addressed this visit.)  Long Term Goal Evaluation of current treatment plan related to  Prostate Issues with elevated PSA level of 12.18 one month ago. , Lacks knowledge of community resource: dental resources for broken dentures and information regarding applying for disability,  self-management and patient's adherence to plan as established by provider. Discussed plans with patient for ongoing care management follow up and provided patient with direct contact information for care management team Evaluation of current treatment plan related to elevated PSA level to 12.18 and patient's adherence to plan as established by provider Reviewed scheduled/upcoming provider appointments including Social Work  referral for dental resources for broken denture and applying for disability. Discussed plans with patient for ongoing care management follow up and provided patient with direct contact information for care management team  Patient awaiting to hear from either Dr. Shann Medal office or [place] regarding ordered MRI of prostate.  Awaiting Medicaid Insurance approval after Dr. Shann Medal office resubmitted required information to insurance for approval.   Patient Goals/Self-Care Activities: Patient will self administer medications as prescribed Patient will attend all scheduled provider appointments Patient will call pharmacy for medication refills Patient will continue to perform ADL's independently Patient will continue to perform IADL's independently Patient will call provider office for new concerns or questions       Follow Up:  Patient agrees to Care Plan and Follow-up.  Plan: The Managed Medicaid care management  team will reach out to the patient again over the next 30 days.  Date/time of next scheduled RN care management/care coordination outreach:  08/18/22 @ [place] RN, [place] RN Care Coordinator

## 2022-07-18 NOTE — Patient Instructions (Signed)
Visit Information  Mr. Luedke was given information about Medicaid Managed Care team care coordination services as a part of their [facility] Medicaid benefit. [name] verbally consented to engagement with the [facility] Managed Care team.   If you are experiencing a medical emergency, please call 911 or report to your local emergency department or urgent care.   If you have a non-emergency medical problem during routine business hours, please contact your provider's office and ask to speak with a nurse.   For questions related to your [facility] health plan, please call: [phone] or go here:[email or web address]  If you would like to schedule transportation through your [facility] plan, please call the following number at least 2 days in advance of your appointment: [phone].  You can also use the MTM portal or MTM mobile app to manage your rides. For the portal, please go to [email or web address].  Call the [place] at [phone], at any time, 24 hours a day, 7 days a week. If you are in danger or need immediate medical attention call 911.  If you would like help to quit smoking, call 1-800-QUIT-NOW 7823977141) OR Espaol: 1-855-Djelo-Ya (2-585-277-8242) o para ms informacin haga clic aqu or Text READY to 200-400 to register via text  Mr. Rugg,   Please see education materials related to diabetes provided by MyChart link.  Patient verbalizes understanding of instructions and care plan provided today and agrees to view in [place]. Active MyChart status and patient understanding of how to access instructions and care plan via MyChart confirmed with patient.     Telephone follow up appointment with Managed Medicaid care management team member scheduled for:08/18/22 @ [place] RN, [place] RN Care Coordinator   Following is a copy of your plan of care:  Care Plan : [facility] Plan of Care   Updates made by Melissa Montane, RN since 07/18/2022 12:00 AM     Problem: Chronic Disease Management & Care Coordination Needs for DMI & HTN      Long-Range Goal: Development of Plan of Care for Chronic Disease Management & Care Coordination Needs   Start Date: 07/14/2021  Expected End Date: 08/19/2022  Priority: High  Note:   Current Barriers:  Knowledge Deficits related to plan of care for management of HTN and DM I and Prostate Issues Chronic Disease Management support and education needs related to HTN and DM I and Prostate Issues  RNCM Clinical Goal(s):  Patient will verbalize understanding of plan for management of HTN and DM I and Prostate Issues as evidenced by good management of these chronic diseases verbalize basic understanding of HTN and DM I and Prostate Issues disease process and self health management plan as evidenced by good management of these chronic diseases take all medications exactly as prescribed and will call provider for medication related questions as evidenced by patient being medication compliant.    attend all scheduled medical appointments: 7/28 for labs and 8/1 with Dr. Kumar(Endocrinology) as evidenced by          demonstrate ongoing adherence to prescribed treatment plan for HTN and DM I and Prostate Issues as evidenced by noted improvement of chronic disease managment of these diseases. demonstrate ongoing health management independence as evidenced by good maintenance of HTN, DM I  and Prostate Issues.        continue to work with Consulting civil engineer and/or Social Worker to address care management and care coordination needs related to  HTN and DM I and Prostate Issues as evidenced by adherence to CM Team Scheduled appointments     work with [employer] care guide to address needs related to [employer] of community resource: Corporate investment banker and Applying for Disability as evidenced by patient and/or community resource care guide support    through  collaboration with Consulting civil engineer, provider, and care team.   Interventions: Inter-disciplinary care team collaboration (see longitudinal plan of care) Evaluation of current treatment plan related to  self management and patient's adherence to plan as established by provider 03/22/22: BSW completed telephone Outreach with patient. He stated he was going to a dentist office but they closed. Patient stated he can get dentures every 10 years, he is coming up on 8 years and they broke. BSW will provide patient a list of dentist that accept Medicaid in [facility]. BSW informed patient he would need to call or go to the [facility] office to have his disability started, BSW will provide patient with the telephone number for SSA. Patient would like for resources to be emailed to [email or web address]. No other resources needed at this time.  04/11/22: BSW completed telephone outreach with patient. He stated he did receive the email of resources and has called some of them. He states he is waiting for a few things and would give the other resources a call today. Patient stated he does not need assistance with anything else and no other resources are needed at this time.   Diabetes:  (Status: Goal on Track (progressing): YES.) Lab Results  Component Value Date   HGBA1C 7.8 (H) 06/17/2022    Lab Results  Component Value Date   HGBA1C 7.8 (H) 06/17/2022    Assessed patient's understanding of A1c goal: <7% Provided education to patient about basic DM disease process; Reviewed medications with patient and discussed importance of medication adherence;        Counseled on importance of regular laboratory monitoring as prescribed;        Discussed plans with patient for ongoing care management follow up and provided patient with direct contact information for care management team;           Reviewed scheduled/upcoming provider appointments including:09/07/22 with Dr. Dwyane Dee and 09/26/22 with PCP          Discussed food choices, encouraged patient to include protein with night time snack, examples provided Reviewed provider documentation, discussed with patient, assisted him with finding insulin recommendations in [place] patient to adjust insulin dose as recommended            Hypertension: (Status: Goal Met.) Last practice recorded BP readings:  BP Readings from Last 3 Encounters:  06/21/22 122/78  05/30/22 135/79  03/17/22 130/80    Most recent eGFR/CrCl:  Lab Results  Component Value Date   EGFR 65 05/30/2022    No components found for: "CRCL"  Evaluation of current treatment plan related to hypertension self management and patient's adherence to plan as established by provider;   Reviewed medications with patient and discussed importance of compliance;  Counseled on the importance of exercise goals with target of 150 minutes per week Discussed plans with patient for ongoing care management follow up and provided patient with direct contact information for care management team; Advised patient, providing education and rationale, to monitor blood pressure daily and record, calling PCP for findings outside established parameters;  Reviewed scheduled/upcoming provider appointments including:  Advised patient to discuss any concerns or questions with provider; Assessed social  determinant of health barriers;     Prostate Issues:  (Status:  Condition stable.  Not addressed this visit.)  Long Term Goal Evaluation of current treatment plan related to  Prostate Issues with elevated PSA level of 12.18 one month ago. , Lacks knowledge of community resource: dental resources for broken dentures and information regarding applying for disability,  self-management and patient's adherence to plan as established by provider. Discussed plans with patient for ongoing care management follow up and provided patient with direct contact information for care management team Evaluation of current  treatment plan related to elevated PSA level to 12.18 and patient's adherence to plan as established by provider Reviewed scheduled/upcoming provider appointments including Social Work referral for dental resources for broken denture and applying for disability. Discussed plans with patient for ongoing care management follow up and provided patient with direct contact information for care management team  Patient awaiting to hear from either Dr. Shann Medal office or [place] regarding ordered MRI of prostate.  Awaiting Medicaid Insurance approval after Dr. Shann Medal office resubmitted required information to insurance for approval.   Patient Goals/Self-Care Activities: Patient will self administer medications as prescribed Patient will attend all scheduled provider appointments Patient will call pharmacy for medication refills Patient will continue to perform ADL's independently Patient will continue to perform IADL's independently Patient will call provider office for new concerns or questions

## 2022-07-22 ENCOUNTER — Telehealth: Payer: Self-pay | Admitting: Endocrinology

## 2022-07-22 NOTE — Telephone Encounter (Signed)
Patient picked up 2 Continuous Blood Gluc Sensor (DEXCOM G7 SENSOR) MISC

## 2022-07-27 ENCOUNTER — Other Ambulatory Visit: Payer: Self-pay

## 2022-07-27 DIAGNOSIS — E1042 Type 1 diabetes mellitus with diabetic polyneuropathy: Secondary | ICD-10-CM

## 2022-08-09 DIAGNOSIS — C61 Malignant neoplasm of prostate: Secondary | ICD-10-CM | POA: Insufficient documentation

## 2022-08-09 DIAGNOSIS — Z8042 Family history of malignant neoplasm of prostate: Secondary | ICD-10-CM | POA: Insufficient documentation

## 2022-08-16 ENCOUNTER — Other Ambulatory Visit: Payer: Self-pay | Admitting: Family

## 2022-08-16 DIAGNOSIS — E1042 Type 1 diabetes mellitus with diabetic polyneuropathy: Secondary | ICD-10-CM

## 2022-08-16 NOTE — Telephone Encounter (Signed)
Requested Prescriptions  Pending Prescriptions Disp Refills  . gabapentin (NEURONTIN) 300 MG capsule [Pharmacy Med Name: GABAPENTIN 300 MG CAPSULE] 90 capsule 3    Sig: TAKE 1 CAPSULE BY MOUTH EVERYDAY AT BEDTIME     Neurology: Anticonvulsants - gabapentin Passed - 08/16/2022  5:17 PM      Passed - Cr in normal range and within 360 days    Creatinine, Ser  Date Value Ref Range Status  06/17/2022 1.38 0.40 - 1.50 mg/dL Final   Creatinine,U  Date Value Ref Range Status  06/17/2022 96.8 mg/dL Final   Creatinine, Urine  Date Value Ref Range Status  08/23/2012 198.50 mg/dL Final         Passed - Completed PHQ-2 or PHQ-9 in the last 360 days      Passed - Valid encounter within last 12 months    Recent Outpatient Visits          2 months ago Essential (primary) hypertension   Primary Care at [facility], Amy J, NP   5 months ago Essential (primary) hypertension   Primary Care at [facility], Amy J, NP   8 months ago Essential hypertension   Primary Care at [facility], Amy J, NP   11 months ago Essential hypertension   Primary Care at [facility], Amy J, NP   1 year ago Essential hypertension   Primary Care at [facility], [name], NP      Future Appointments            In 1 month [name], NP Primary Care at [facility]

## 2022-08-18 ENCOUNTER — Other Ambulatory Visit: Payer: Self-pay | Admitting: *Deleted

## 2022-08-18 NOTE — Patient Outreach (Signed)
Medicaid Managed Care   Nurse Care Manager Note  08/18/2022 Name:  Patrick [name] MRN:  [id number] DOB:  1960/11/25  Patrick [name] is an 61 y.o. year old male who is a primary patient of [name], NP.  The [facility] Managed Care Coordination team was consulted for assistance with:    DMI Prostate   Patrick [name] was given information about Medicaid Managed Care Coordination team services today. Patrick [name] Patient agreed to services and verbal consent obtained.  Engaged with patient by telephone for follow up visit in response to provider referral for case management and/or care coordination services.   Assessments/Interventions:  Review of past medical history, allergies, medications, health status, including review of consultants reports, laboratory and other test data, was performed as part of comprehensive evaluation and provision of chronic care management services.  SDOH (Social Determinants of Health) assessments and interventions performed: SDOH Interventions    Flowsheet Row Patient Outreach Telephone from 08/18/2022 in [place] Patient Outreach Telephone from 07/18/2022 in [place] Patient Outreach Telephone from 06/16/2022 in [place] Patient Outreach Telephone from 07/14/2021 in [place] Interventions      Food Insecurity Interventions -- -- -- Intervention Not Indicated  Housing Interventions -- -- Intervention Not Indicated Intervention Not Indicated  Transportation Interventions Intervention Not Indicated -- Intervention Not Indicated Intervention Not Indicated  Financial Strain Interventions -- -- -- Intervention Not Indicated  Physical Activity Interventions -- Intervention Not Indicated -- Other (Comments)  [Encourage patient to continue walking everyday]  Stress Interventions -- -- -- Other  (Comment)  [Patient likes to listen to music and/or read/work on cars when stressed]  Social Connections Interventions -- -- -- Intervention Not Indicated       Care Plan  No Known Allergies  Medications Reviewed Today     Reviewed by Melissa Montane, RN (Registered Nurse) on 08/18/22 at [phone]  Med List Status: <None>   Medication Order Taking? Sig Documenting Provider Last Dose Status Informant  ACCU-CHEK AVIVA PLUS test strip [id number] Yes 1 EACH BY OTHER ROUTE 4 (FOUR) TIMES DAILY Renato Shin, MD Taking Active   B-D UF III MINI PEN NEEDLES 31G X 5 MM MISC [id number] Yes USE 3 TIMES DAILY [name], Icey Tello Crazier, MD Taking Active   Blood Pressure Monitor DEVI [id number] Yes Use as directed to check home blood pressure at least 4 times a week. Please notify primary provider of blood pressures greater than 140/90. Please send prescription to [facility].  Phone Number [phone] Fax Number [phone] [name], NP Taking Active   Continuous Blood Gluc Receiver ([place]) [place] [id number] Yes Use to check blood sugar daily [name], MD Taking Active   Continuous Blood Gluc Sensor ([place]) [place] [id number] Yes Change every 10 days [name], MD Taking Active   cycloSPORINE (RESTASIS) 0.05 % ophthalmic emulsion [id number] Yes Place 1 drop into both eyes 2 (two) times daily. [name], NP Taking Active Self  gabapentin (NEURONTIN) 300 MG capsule [id number] Yes TAKE 1 CAPSULE BY MOUTH EVERYDAY AT BEDTIME [name], NP Taking Active   insulin aspart (NOVOLOG FLEXPEN) 100 UNIT/ML FlexPen [id number] Yes 4 times a day (just before each meal): 6-6-4-2 units Renato Shin, MD Taking Active   insulin glargine (LANTUS SOLOSTAR) 100 UNIT/ML Solostar Pen [id number] Yes Inject 8 Units into the skin at bedtime. Renato Shin, MD  Taking Active   latanoprost (XALATAN) 0.005 % ophthalmic solution [id number] Yes SMARTSIG:1 Drop(s) In Eye(s) Every Evening [provider] Taking Active   losartan-hydrochlorothiazide (HYZAAR) 100-25 MG tablet [id number] Yes Take 1 tablet by mouth daily. [name], NP Taking Active   metFORMIN (GLUCOPHAGE-XR) 500 MG 24 hr tablet [id number] Yes Take 3 tablets (1,500 mg total) by mouth daily with supper. [name], MD Taking Active   Multiple Vitamin (MULTIVITAMIN WITH MINERALS) TABS tablet [id number] Yes Take 1 tablet by mouth daily. [provider] Taking Active Self  sildenafil (VIAGRA) 25 MG tablet [id number] No Take 1 tablet 1/2 hour to 1 hour prior to intercourse as needed. Limit use to 1/2 tablet or 1 tablet per 24 hours.  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) 08/23/2012   Acute renal failure ([place]) 08/23/2012   Thrombocytopenia ([place]) 08/23/2012   Coagulopathy ([place]) 08/23/2012   Ascites 0000000   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    Conditions to be addressed/monitored per PCP order:   DMI and Prostate  Care Plan : [facility] Plan of Care  Updates made by Melissa Montane, RN since 08/18/2022 12:00 AM     Problem: Chronic Disease Management & Care Coordination Needs for DMI & HTN       Long-Range Goal: Development of Plan of Care for Chronic Disease Management & Care Coordination Needs   Start Date: 07/14/2021  Expected End Date: 10/12/2022  Priority: High  Note:   Current Barriers:  Knowledge Deficits related to plan of care for management of  DM I and Prostate Issues Chronic Disease Management support and education needs related to  DM I and Prostate Issues  RNCM Clinical Goal(s):  Patient will verbalize understanding of plan for management of HTN and DM I and Prostate Issues as evidenced by good management of these chronic diseases verbalize basic understanding of HTN and DM I and Prostate Issues disease process and self health management plan as evidenced by good management of these chronic diseases take all medications exactly as prescribed and will call provider for medication related questions as evidenced by patient being medication compliant.    attend all scheduled medical appointments: 7/28 for labs and 8/1 with Dr. Kumar(Endocrinology) as evidenced by          demonstrate ongoing adherence to prescribed treatment plan for HTN and DM I and Prostate Issues as evidenced by noted improvement of chronic disease managment of these diseases. demonstrate ongoing health management independence as evidenced by good maintenance of HTN, DM I  and Prostate Issues.        continue to work with  RN Care Manager and/or Social Worker to address care management and care coordination needs related to HTN and DM I and Prostate Issues as evidenced by adherence to CM Team Scheduled appointments     work with community resource care guide to address needs related to [employer] of community resource: Corporate investment banker and Applying for Disability as evidenced by patient and/or community resource care guide support    through collaboration with Consulting civil engineer, provider, and care team.   Interventions: Inter-disciplinary care team collaboration (see longitudinal plan of  care) Evaluation of current treatment plan related to  self management and patient's adherence to plan as established by provider 03/22/22: BSW completed telephone Outreach with patient. He stated he was going to a dentist office but they closed. Patient stated he can get dentures every 10 years, he is coming up on 8 years and they broke. BSW will provide patient a list of dentist that accept Medicaid in [facility]. BSW informed patient he would need to call or go to the [facility] office to have his disability started, BSW will provide patient with the telephone number for SSA. Patient would like for resources to be emailed to [email or web address]. No other resources needed at this time.  04/11/22: BSW completed telephone outreach with patient. He stated he did receive the email of resources and has called some of them. He states he is waiting for a few things and would give the other resources a call today. Patient stated he does not need assistance with anything else and no other resources are needed at this time.   Diabetes:  (Status: Goal on Track (progressing): YES.) Lab Results  Component Value Date   HGBA1C 7.8 (H) 06/17/2022    Lab Results  Component Value Date   HGBA1C 7.8 (H) 06/17/2022    Assessed patient's understanding of A1c goal: <7% Provided education to patient about basic DM disease process; Reviewed medications with patient and discussed importance of medication adherence;        Counseled on importance of regular laboratory monitoring as prescribed;        Discussed plans with patient for ongoing care management follow up and provided patient with direct contact information for care management team;           Reviewed scheduled/upcoming provider appointments including:09/02/22 for labs, 09/05/22 for eye exam, 09/07/22 with Dr. Dwyane Dee, 09/13/22 for MRI and 09/26/22 with PCP         Discussed food choices, encouraged patient to include protein with night time snack, examples  provided Advised patient to discuss metformin intolerance with Dr. Dwyane Dee on 09/07/22             Prostate Issues:  (Status:  Goal on track:  Yes.)  Long Term Goal Evaluation of current treatment plan related to  Prostate Issues with elevated PSA level of 12.18 one month ago. , Lacks knowledge of community resource: dental resources for broken dentures and information regarding applying for disability,  self-management and patient's adherence to plan as established by provider. Discussed plans with patient for ongoing care management follow up and provided patient with direct contact information for care management team Evaluation of current treatment plan related to elevated PSA level to 12.18 and patient's adherence to plan as established by provider Reviewed scheduled/upcoming provider appointments including Prostate MRI on 09/13/22      Patient Goals/Self-Care Activities: Patient will self administer medications as prescribed Patient will attend all scheduled provider appointments Patient will call pharmacy for medication refills  Patient will continue to perform ADL's independently Patient will continue to perform IADL's independently Patient will call provider office for new concerns or questions       Follow Up:  Patient agrees to Care Plan and Follow-up.  Plan: The Managed Medicaid care management team will reach out to the patient again over the next 45 days.  Date/time of next scheduled RN care management/care coordination outreach:  10/03/22 @ [place] RN, Allisonia RN Care Coordinator

## 2022-08-18 NOTE — Patient Instructions (Signed)
Visit Information  Patrick [name] was given information about Medicaid Managed Care team care coordination services as a part of their [facility] Medicaid benefit. Patrick [name] verbally consented to engagement with the [facility] Managed Care team.   If you are experiencing a medical emergency, please call 911 or report to your local emergency department or urgent care.   If you have a non-emergency medical problem during routine business hours, please contact your provider's office and ask to speak with a nurse.   For questions related to your [facility] health plan, please call: [phone] or go here:[email or web address]  If you would like to schedule transportation through your [facility] plan, please call the following number at least 2 days in advance of your appointment: [phone].  You can also use the MTM portal or MTM mobile app to manage your rides. For the portal, please go to [email or web address].  Call the [place] at [phone], at any time, 24 hours a day, 7 days a week. If you are in danger or need immediate medical attention call 911.  If you would like help to quit smoking, call 1-800-QUIT-NOW (640)821-9845) OR Espaol: 1-855-Djelo-Ya (1-751-025-8527) o para ms informacin haga clic aqu or Text READY to 200-400 to register via text  Patrick [name],   Please see education materials related to DMI and PSA provided by MyChart link.  Patient verbalizes understanding of instructions and care plan provided today and agrees to view in [place]. Active MyChart status and patient understanding of how to access instructions and care plan via MyChart confirmed with patient.     Telephone follow up appointment with Managed Medicaid care management team member scheduled for:10/03/22 @ Maili RN, BSN [place] RN Care Coordinator   Following is a copy of your plan of care:  Care Plan : [facility] Plan of Care   Updates made by Melissa Montane, RN since 08/18/2022 12:00 AM     Problem: Chronic Disease Management & Care Coordination Needs for DMI & HTN      Long-Range Goal: Development of Plan of Care for Chronic Disease Management & Care Coordination Needs   Start Date: 07/14/2021  Expected End Date: 10/12/2022  Priority: High  Note:   Current Barriers:  Knowledge Deficits related to plan of care for management of  DM I and Prostate Issues Chronic Disease Management support and education needs related to  DM I and Prostate Issues  RNCM Clinical Goal(s):  Patient will verbalize understanding of plan for management of HTN and DM I and Prostate Issues as evidenced by good management of these chronic diseases verbalize basic understanding of HTN and DM I and Prostate Issues disease process and self health management plan as evidenced by good management of these chronic diseases take all medications exactly as prescribed and will call provider for medication related questions as evidenced by patient being medication compliant.    attend all scheduled medical appointments: 7/28 for labs and 8/1 with Dr. Kumar(Endocrinology) as evidenced by          demonstrate ongoing adherence to prescribed treatment plan for HTN and DM I and Prostate Issues as evidenced by noted improvement of chronic disease managment of these diseases. demonstrate ongoing health management independence as evidenced by good maintenance of HTN, DM I  and Prostate Issues.        continue to work with Consulting civil engineer and/or Social Worker to address care management and care coordination needs related to  HTN and DM I and Prostate Issues as evidenced by adherence to CM Team Scheduled appointments     work with [employer] care guide to address needs related to [employer] of community resource: Radiographer, therapeutic and Applying for Disability as evidenced by patient and/or community resource care guide support    through collaboration  with Medical illustrator, provider, and care team.   Interventions: Inter-disciplinary care team collaboration (see longitudinal plan of care) Evaluation of current treatment plan related to  self management and patient's adherence to plan as established by provider 03/22/22: BSW completed telephone Outreach with patient. He stated he was going to a dentist office but they closed. Patient stated he can get dentures every 10 years, he is coming up on 8 years and they broke. BSW will provide patient a list of dentist that accept Medicaid in [facility]. BSW informed patient he would need to call or go to the [facility] office to have his disability started, BSW will provide patient with the telephone number for [facility]. Patient would like for resources to be emailed to [email or web address]. No other resources needed at this time.  04/11/22: BSW completed telephone outreach with patient. He stated he did receive the email of resources and has called some of them. He states he is waiting for a few things and would give the other resources a call today. Patient stated he does not need assistance with anything else and no other resources are needed at this time.   Diabetes:  (Status: Goal on Track (progressing): YES.) Lab Results  Component Value Date   HGBA1C 7.8 (H) 06/17/2022    Lab Results  Component Value Date   HGBA1C 7.8 (H) 06/17/2022    Assessed patient's understanding of A1c goal: <7% Provided education to patient about basic DM disease process; Reviewed medications with patient and discussed importance of medication adherence;        Counseled on importance of regular laboratory monitoring as prescribed;        Discussed plans with patient for ongoing care management follow up and provided patient with direct contact information for care management team;           Reviewed scheduled/upcoming provider appointments including:09/02/22 for labs, 09/05/22 for eye exam, 09/07/22 with Dr. Lucianne Muss,  09/13/22 for MRI and 09/26/22 with PCP         Discussed food choices, encouraged patient to include protein with night time snack, examples provided Advised patient to discuss metformin intolerance with Dr. Lucianne Muss on 09/07/22             Prostate Issues:  (Status:  Goal on track:  Yes.)  Long Term Goal Evaluation of current treatment plan related to  Prostate Issues with elevated PSA level of 12.18 one month ago. , Lacks knowledge of community resource: dental resources for broken dentures and information regarding applying for disability,  self-management and patient's adherence to plan as established by provider. Discussed plans with patient for ongoing care management follow up and provided patient with direct contact information for care management team Evaluation of current treatment plan related to elevated PSA level to 12.18 and patient's adherence to plan as established by provider Reviewed scheduled/upcoming provider appointments including Prostate MRI on 09/13/22      Patient Goals/Self-Care Activities: Patient will self administer medications as prescribed Patient will attend all scheduled provider appointments Patient will call pharmacy for medication refills Patient will continue to perform ADL's independently Patient will continue to perform IADL's independently Patient will  call provider office for new concerns or questions

## 2022-09-02 ENCOUNTER — Other Ambulatory Visit (INDEPENDENT_AMBULATORY_CARE_PROVIDER_SITE_OTHER): Payer: Medicaid Other

## 2022-09-02 DIAGNOSIS — E782 Mixed hyperlipidemia: Secondary | ICD-10-CM | POA: Diagnosis not present

## 2022-09-02 DIAGNOSIS — E1065 Type 1 diabetes mellitus with hyperglycemia: Secondary | ICD-10-CM

## 2022-09-02 LAB — LIPID PANEL
Cholesterol: 132 mg/dL (ref 0–200)
HDL: 34.2 mg/dL — ABNORMAL LOW (ref >39.00)
LDL Cholesterol: 67 mg/dL (ref 0–99)
NonHDL: 97.95
Total CHOL/HDL Ratio: 4
Triglycerides: 155 mg/dL — ABNORMAL HIGH (ref 0.0–149.0)
VLDL: 31 mg/dL (ref 0.0–40.0)

## 2022-09-02 LAB — COMPREHENSIVE METABOLIC PANEL
ALT: 30 U/L (ref 0–53)
AST: 23 U/L (ref 0–37)
Albumin: 4.7 g/dL (ref 3.5–5.2)
Alkaline Phosphatase: 75 U/L (ref 39–117)
BUN: 20 mg/dL (ref 6–23)
CO2: 31 mEq/L (ref 19–32)
Calcium: 9.7 mg/dL (ref 8.4–10.5)
Chloride: 100 mEq/L (ref 96–112)
Creatinine, Ser: 1.3 mg/dL (ref 0.40–1.50)
GFR: 59.81 mL/min — ABNORMAL LOW (ref >60.00)
Glucose, Bld: 111 mg/dL — ABNORMAL HIGH (ref 70–99)
Potassium: 4.1 mEq/L (ref 3.5–5.1)
Sodium: 137 mEq/L (ref 135–145)
Total Bilirubin: 0.4 mg/dL (ref 0.2–1.2)
Total Protein: 8.2 g/dL (ref 6.0–8.3)

## 2022-09-02 LAB — HEMOGLOBIN A1C: Hgb A1c MFr Bld: 7.7 % — ABNORMAL HIGH (ref 4.6–6.5)

## 2022-09-05 LAB — HM DIABETES EYE EXAM

## 2022-09-06 ENCOUNTER — Encounter: Payer: Self-pay | Admitting: Endocrinology

## 2022-09-07 ENCOUNTER — Ambulatory Visit (INDEPENDENT_AMBULATORY_CARE_PROVIDER_SITE_OTHER): Payer: Medicaid Other | Admitting: Endocrinology

## 2022-09-07 ENCOUNTER — Encounter: Payer: Self-pay | Admitting: Endocrinology

## 2022-09-07 VITALS — BP 142/82 | HR 113 | Ht 71.0 in | Wt 178.8 lb

## 2022-09-07 DIAGNOSIS — E782 Mixed hyperlipidemia: Secondary | ICD-10-CM | POA: Diagnosis not present

## 2022-09-07 DIAGNOSIS — E1065 Type 1 diabetes mellitus with hyperglycemia: Secondary | ICD-10-CM

## 2022-09-07 NOTE — Progress Notes (Signed)
Patient ID: Patrick [name], male   DOB: September 10, 1961, 61 y.o.   MRN: [id number]           Reason for Appointment: Type II Diabetes follow-up   History of Present Illness   Diagnosis date: 2015   Previous history:  Non-insulin hypoglycemic drugs previously used: Unknown Insulin was started in 2015, however was off insulin between 2018 and 2020  A1c range in the last few years is: 6.3-8.1  Recent history:     Non-insulin hypoglycemic drugs: None     Insulin regimen: NovoLog 6 units with breakfast and lunch, 4 units before dinner  Lantus 8 units at bedtime    Side effects from medications: None  Current self management, blood sugar patterns and problems identified:  A1c is 7.7 and about the same this year On his last visit he had only 3 days of CGM available for review and has only about 2 weeks worth of sensor data His sugars were relatively higher overnight on the last visit but he did not start the metformin as recommended He says that he was wanting to first change his diet and not take another medication He says that he is cut back on snacks and cookies and generally eating healthier Without any change in insulin regimen his blood sugars appear to be significantly better and 94% within target range Also no hypoglycemia except as low normal readings late at night Has stopped taking NovoLog at bedtime also has directed Takes Lantus at night consistently Tends to have relatively lower carbohydrate intake at dinnertime He is walking regularly and mostly in morning hours  Exercise: Evening for about 30 min Diet management: Breakfast usually egg, potatoes or other starch, meat Dinner usually 6 pm  Dexcom CGM analysis for the last 7-8 days is interpreted as below  Blood sugars are on average are relatively stable at all times ranging from about 130-140 and mild variability He does have a tendency to periodic hyperglycemia in the afternoons and evenings but low normal readings  after 10 PM  OVERNIGHT blood sugars are moderately variable with readings as low as 92 but generally has average of about 130-140 and no hypoglycemia  Premeal readings as above are consistently in the same range  at the upper end of the target range except during the morning hours and late evening with some fluctuation Overnight blood sugars are relatively higher overall and appear to be higher around 3 AM when the average blood sugar is 172. No hypoglycemia at any time with only once transiently low sugar at 4 PM POSTPRANDIAL readings are relatively well controlled at all meals with only occasional hyperglycemic spikes between 6-9 PM  However blood sugars tend to be lower after 10 PM averaging 117-120   CGM use % of time 99  2-week average/GV 138/18  Time in range     94   %  % Time Above 180 6  % Time above 250   % Time Below 70 0     Dietician visit: Most recent: Years ago     Weight control:  Wt Readings from Last 3 Encounters:  09/07/22 178 lb 12.8 oz (81.1 kg)  06/21/22 178 lb 12.8 oz (81.1 kg)  05/30/22 180 lb (81.6 kg)            Diabetes labs:  Lab Results  Component Value Date   HGBA1C 7.7 (H) 09/02/2022   HGBA1C 7.8 (H) 06/17/2022   HGBA1C 8.1 (A) 03/17/2022   Lab Results  Component Value Date   MICROALBUR <0.7 06/17/2022   LDLCALC 67 09/02/2022   CREATININE 1.30 09/02/2022    No results found for: "FRUCTOSAMINE"   Allergies as of 09/07/2022   No Known Allergies      Medication List        Accurate as of September 07, 2022  8:48 PM. If you have any questions, ask your nurse or doctor.          STOP taking these medications    metFORMIN 500 MG 24 hr tablet Commonly known as: GLUCOPHAGE-XR Stopped by: [name], MD       TAKE these medications    Accu-Chek Aviva Plus test strip Generic drug: glucose blood 1 EACH BY OTHER ROUTE 4 (FOUR) TIMES DAILY   B-D UF III MINI PEN NEEDLES 31G X 5 MM Misc Generic drug: Insulin Pen Needle USE  3 TIMES DAILY   Blood Pressure Monitor Devi Use as directed to check home blood pressure at least 4 times a week. Please notify primary provider of blood pressures greater than 140/90. Please send prescription to [facility].  Phone Number [phone] Fax Number [phone]   cycloSPORINE 0.05 % ophthalmic emulsion Commonly known as: RESTASIS Place 1 drop into both eyes 2 (two) times daily.   Dexcom G7 Receiver Devi Use to check blood sugar daily   Dexcom G7 Sensor Misc Change every 10 days   gabapentin 300 MG capsule Commonly known as: NEURONTIN TAKE 1 CAPSULE BY MOUTH EVERYDAY AT BEDTIME   Lantus SoloStar 100 UNIT/ML Solostar Pen Generic drug: insulin glargine Inject 8 Units into the skin at bedtime.   latanoprost 0.005 % ophthalmic solution Commonly known as: XALATAN SMARTSIG:1 Drop(s) In Eye(s) Every Evening   losartan-hydrochlorothiazide 100-25 MG tablet Commonly known as: HYZAAR Take 1 tablet by mouth daily.   multivitamin with minerals Tabs tablet Take 1 tablet by mouth daily.   NovoLOG FlexPen 100 UNIT/ML FlexPen Generic drug: insulin aspart 4 times a day (just before each meal): 6-6-4-2 units   sildenafil 25 MG tablet Commonly known as: VIAGRA Take 1 tablet 1/2 hour to 1 hour prior to intercourse as needed. Limit use to 1/2 tablet or 1 tablet per 24 hours.   simvastatin 20 MG tablet Commonly known as: ZOCOR TAKE 1 TABLET BY MOUTH EVERYDAY AT BEDTIME        Allergies: No Known Allergies  Past Medical History:  Diagnosis Date   Cirrhosis ([place])    Diabetes ([place])    type 1   Diabetes mellitus without complication ([place])    Phreesia 02/21/2021   ETOH abuse     Past Surgical History:  Procedure Laterality Date   EYE SURGERY N/A    Phreesia 02/21/2021   MOUTH SURGERY     on gums   TONSILLECTOMY      Family History  Problem Relation Age of Onset   Heart disease Mother        bi-pass   Hypertension Mother    Cancer Brother         unsure type   Hypertension Father    Colon cancer Neg Hx     Social History:  reports that he quit smoking about 12 years ago. His smoking use included cigarettes. He has been exposed to tobacco smoke. He has never used smokeless tobacco. He reports that he does not drink alcohol and does not use drugs.  Review of Systems:  Last diabetic eye exam date 9/22  Last foot exam date: 10/22  Symptoms of neuropathy: None  Hypertension:   Treatment includes losartan  BP Readings from Last 3 Encounters:  09/07/22 (!) 142/82  06/21/22 122/78  05/30/22 135/79    Lipid management: On Zocor 20 mg daily since 8/23 He also has improved his diet LDL and triglycerides are improved significantly    Lab Results  Component Value Date   CHOL 132 09/02/2022   CHOL 196 06/17/2022   CHOL 155 03/01/2014   Lab Results  Component Value Date   HDL 34.20 (L) 09/02/2022   HDL 30.20 (L) 06/17/2022   HDL 30 (L) 03/01/2014   Lab Results  Component Value Date   LDLCALC 67 09/02/2022   LDLCALC 105 (H) 03/01/2014   Lab Results  Component Value Date   TRIG 155.0 (H) 09/02/2022   TRIG 303.0 (H) 06/17/2022   TRIG 101 03/01/2014   Lab Results  Component Value Date   CHOLHDL 4 09/02/2022   CHOLHDL 6 06/17/2022   CHOLHDL 5.2 03/01/2014   Lab Results  Component Value Date   LDLDIRECT 113.0 06/17/2022     Examination:   BP (!) 142/82   Pulse (!) 113   Ht 5\' 11"  (1.803 m)   Wt 178 lb 12.8 oz (81.1 kg)   SpO2 97%   BMI 24.94 kg/m   Body mass index is 24.94 kg/m.    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  Current regimen: Low-dose basal bolus insulin  See history of present illness for detailed discussion of current diabetes management, blood sugar patterns and problems identified  A1c is 7.7  Blood glucose control is recently excellent with GMI 6.6 Over 94% within target range without hypoglycemia However blood sugars may be low normal late at night either because of late  NovoLog doses or not of protein at dinnertime This was discussed Since overnight blood sugars are fairly good his basal insulin dose appears to be adequate and effective over 24 hours He is also trying to walk regularly and improve his diet overall  For now he will continue the same insulin regimen, discussed timing of NovoLog 5 to 10 minutes before eating at least Make sure he has some protein at dinnertime and if blood sugars are getting low postprandially cut back to 3 units in the evening Recommendations:  Trial of METFORMIN ER starting with 500 mg and maximum dose of 3 tablets with weekly titration depending on his tolerance Take this with food Go up to 9 units of Lantus and if morning sugars are still consistently over 130 go up to 10 Take 7 units at breakfast of NovoLog, 4 at lunch and 2-3 at dinnertime Does not need to take NovoLog for bedtime snack unless eating more carbohydrate Continue to use the Dexcom Exercise 5 days a week and watch blood sugar before and after exercise   HYPERTENSION: Fairly well controlled  Mild hyperlipidemia: Controlled with simvastatin also triglyceride better with improved diet as above He will continue the same dose of 20 mg Since triglycerides previously high he likely has some element of metabolic syndrome  There are no Patient Instructions on file for this visit.   09/07/2022, 8:48 PM

## 2022-09-08 ENCOUNTER — Encounter: Payer: Self-pay | Admitting: Endocrinology

## 2022-09-19 NOTE — Progress Notes (Signed)
Patient ID: Patrick [name], male    DOB: 12-04-1960  MRN: [id number]  CC: Chronic Care Management   Subjective: Patrick [name] is a 61 y.o. male who presents for chronic care management.   His concerns today include:  Doing well on Losartan-Hydrochlorothiazide. No issues/concerns.   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[place]) 03/22/2014   Cirrhosis of liver ([place]) 03/14/2014   Colon cancer screening 03/14/2014   Hypotension, unspecified 03/01/2014   DKA (diabetic ketoacidoses) 02/28/2014   Hyponatremia 02/28/2014   Hepatic encephalopathy ([place]) 08/24/2012   Anemia 08/23/2012   Malnutrition ([place]) 08/23/2012   Acute renal failure ([place]) 08/23/2012   Thrombocytopenia ([place]) 08/23/2012   Coagulopathy ([place]) 08/23/2012   Ascites 32/67/1245   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     Current Outpatient Medications on File Prior to Visit  Medication Sig Dispense Refill   ACCU-CHEK AVIVA PLUS test strip 1 EACH BY OTHER ROUTE 4 (FOUR) TIMES DAILY (Patient not taking: Reported on 09/07/2022) 100 strip 13   B-D UF III MINI PEN NEEDLES 31G X 5 MM MISC USE 3 TIMES DAILY 300 each 1   Blood Pressure Monitor DEVI Use as directed to check home blood pressure at least 4 times a week. Please notify primary provider of blood pressures greater than 140/90. Please send prescription to [facility].  Phone Number [phone] Fax Number [phone] 1 each 0   Continuous Blood Gluc Receiver (DEXCOM G7 RECEIVER) DEVI Use to check blood sugar daily 1 each 0   Continuous Blood Gluc Sensor (DEXCOM G7 SENSOR) MISC Change every 10 days 9 each 2   cycloSPORINE (RESTASIS) 0.05 % ophthalmic emulsion Place 1 drop into both eyes 2 (two) times  daily.     gabapentin (NEURONTIN) 300 MG capsule TAKE 1 CAPSULE BY MOUTH EVERYDAY AT BEDTIME 90 capsule 3   insulin aspart (NOVOLOG FLEXPEN) 100 UNIT/ML FlexPen 4 times a day (just before each meal): 6-6-4-2 units 30 mL 3   insulin glargine (LANTUS SOLOSTAR) 100 UNIT/ML Solostar Pen Inject 8 Units into the skin at bedtime. 15 mL PRN   latanoprost (XALATAN) 0.005 % ophthalmic solution SMARTSIG:1 Drop(s) In Eye(s) Every Evening     Multiple Vitamin (MULTIVITAMIN WITH MINERALS) TABS tablet Take 1 tablet by mouth daily.     sildenafil (VIAGRA) 25 MG tablet Take 1 tablet 1/2 hour to 1 hour prior to intercourse as needed. Limit use to 1/2 tablet or 1 tablet per 24 hours. 20 tablet 0   simvastatin (ZOCOR) 20 MG tablet TAKE 1 TABLET BY MOUTH EVERYDAY AT BEDTIME 90 tablet 3   No current facility-administered medications on file prior to visit.    No Known Allergies  Social History   Socioeconomic History   Marital status: Single    Spouse name: Not on file   Number of children: 1   Years of education: Not on file   Highest education level: Not on file  Occupational History   Occupation: unemployed  Tobacco Use   Smoking status: Former    Packs/day: 0.00    Years: 15.00    Total pack years: 0.00    Types: Cigarettes    Quit date: 07/14/2010    Years since quitting: 12.2    Passive exposure: Past  Smokeless tobacco: Never  Substance and Sexual Activity   Alcohol use: No    Comment: Not using for last 11 years   Drug use: No   Sexual activity: Not on file  Other Topics Concern   Not on file  Social History Narrative   Not on file   Social Determinants of Health   Financial Resource Strain: Low Risk  (07/14/2021)   Overall Financial Resource Strain (CARDIA)    Difficulty of Paying Living Expenses: Not very hard  Food Insecurity: No Food Insecurity (07/14/2021)   Hunger Vital Sign    Worried About Running Out of Food in the Last Year: Never true    Ran Out of Food in the Last  Year: Never true  Transportation Needs: No Transportation Needs (08/18/2022)   PRAPARE - Administrator, Civil Service (Medical): No    Lack of Transportation (Non-Medical): No  Physical Activity: Sufficiently Active (07/18/2022)   Exercise Vital Sign    Days of Exercise per Week: 5 days    Minutes of Exercise per Session: 30 min  Stress: Stress Concern Present (07/14/2021)   [employer] of Occupational Health - Occupational Stress Questionnaire    Feeling of Stress : To some extent  Social Connections: Unknown (07/14/2021)   Social Connection and Isolation Panel [NHANES]    Frequency of Communication with Friends and Family: More than three times a week    Frequency of Social Gatherings with Friends and Family: More than three times a week    Attends Religious Services: Not on Marketing executive or Organizations: No    Attends Banker Meetings: Never    Marital Status: Living with partner  Intimate Partner Violence: Not on file    Family History  Problem Relation Age of Onset   Heart disease Mother        bi-pass   Hypertension Mother    Cancer Brother        unsure type   Hypertension Father    Colon cancer Neg Hx     Past Surgical History:  Procedure Laterality Date   EYE SURGERY N/A    Phreesia 02/21/2021   MOUTH SURGERY     on gums   TONSILLECTOMY      ROS: Review of Systems Negative except as stated above  PHYSICAL EXAM: BP 136/72 (BP Location: Left Arm, Patient Position: Sitting, Cuff Size: Normal)   Pulse (!) 116   Temp 98.3 F (36.8 C)   Resp 16   Ht 5' 10.98" (1.803 m)   Wt 170 lb (77.1 kg)   SpO2 95%   BMI 23.72 kg/m   Physical Exam HENT:     Head: Normocephalic and atraumatic.  Eyes:     Extraocular Movements: Extraocular movements intact.     Conjunctiva/sclera: Conjunctivae normal.     Pupils: Pupils are equal, round, and reactive to light.  Cardiovascular:     Rate and Rhythm: Tachycardia present.      Pulses: Normal pulses.     Heart sounds: Normal heart sounds.  Pulmonary:     Effort: Pulmonary effort is normal.     Breath sounds: Normal breath sounds.  Musculoskeletal:     Cervical back: Normal range of motion and neck supple.  Neurological:     General: No focal deficit present.     Mental Status: He is alert and oriented to person, place, and time.  Psychiatric:  Mood and Affect: Mood normal.        Behavior: Behavior normal.       ASSESSMENT AND PLAN: 1. Primary hypertension - Continue Losartan-Hydrochlorothiazide as prescribed.  - Counseled on blood pressure goal of less than 130/80, low-sodium, DASH diet, medication compliance, and 150 minutes of moderate intensity exercise per week as tolerated. Counseled on medication adherence and adverse effects. - Follow-up with primary provider in 3 months or sooner if needed.  - losartan-hydrochlorothiazide (HYZAAR) 100-25 MG tablet; Take 1 tablet by mouth daily.  Dispense: 30 tablet; Refill: 2   Patient was given the opportunity to ask questions.  Patient verbalized understanding of the plan and was able to repeat key elements of the plan. Patient was given clear instructions to go to Emergency Department or return to medical center if symptoms don't improve, worsen, or new problems develop.The patient verbalized understanding.    Requested Prescriptions   Signed Prescriptions Disp Refills   losartan-hydrochlorothiazide (HYZAAR) 100-25 MG tablet 30 tablet 2    Sig: Take 1 tablet by mouth daily.    Return in about 3 months (around 12/27/2022) for Follow-Up or next available chronic care mgmt .  [name], NP

## 2022-09-26 ENCOUNTER — Ambulatory Visit (INDEPENDENT_AMBULATORY_CARE_PROVIDER_SITE_OTHER): Payer: Medicaid Other | Admitting: Family

## 2022-09-26 VITALS — BP 136/72 | HR 116 | Temp 98.3°F | Resp 16 | Ht 70.98 in | Wt 170.0 lb

## 2022-09-26 DIAGNOSIS — I10 Essential (primary) hypertension: Secondary | ICD-10-CM

## 2022-09-26 MED ORDER — LOSARTAN POTASSIUM-HCTZ 100-25 MG PO TABS: 1.0000 | ORAL_TABLET | Freq: Every day | ORAL | 2 refills | Status: DC

## 2022-09-26 NOTE — Progress Notes (Signed)
.  Pt presents for chronic care management   

## 2022-10-03 ENCOUNTER — Other Ambulatory Visit: Payer: Medicaid Other | Admitting: *Deleted

## 2022-10-03 ENCOUNTER — Other Ambulatory Visit: Payer: Self-pay

## 2022-10-03 ENCOUNTER — Encounter: Payer: Self-pay | Admitting: *Deleted

## 2022-10-03 DIAGNOSIS — E1042 Type 1 diabetes mellitus with diabetic polyneuropathy: Secondary | ICD-10-CM

## 2022-10-03 MED ORDER — ACCU-CHEK AVIVA PLUS VI STRP: ORAL_STRIP | 13 refills | Status: DC

## 2022-10-03 NOTE — Patient Instructions (Signed)
Visit Information  [name] was given information about Medicaid Managed Care team care coordination services as a part of their [facility] Medicaid benefit. Patrick [name] verbally consented to engagement with the [facility] Managed Care team.   If you are experiencing a medical emergency, please call 911 or report to your local emergency department or urgent care.   If you have a non-emergency medical problem during routine business hours, please contact your provider's office and ask to speak with a nurse.   For questions related to your [facility] health plan, please call: [phone] or go here:[email or web address]  If you would like to schedule transportation through your [facility] plan, please call the following number at least 2 days in advance of your appointment: [phone].  You can also use the MTM portal or MTM mobile app to manage your rides. For the portal, please go to [email or web address].  Call the [place] at [phone], at any time, 24 hours a day, 7 days a week. If you are in danger or need immediate medical attention call 911.  If you would like help to quit smoking, call 1-800-QUIT-NOW 360-740-9325) OR Espaol: 1-855-Djelo-Ya QO:409462) o para ms informacin haga clic aqu or Text READY to 200-400 to register via text  Patrick [name],   Please see education materials related to Diabetes provided by MyChart link.  Patient verbalizes understanding of instructions and care plan provided today and agrees to view in [place]. Active MyChart status and patient understanding of how to access instructions and care plan via MyChart confirmed with patient.     Telephone follow up appointment with Managed Medicaid care management team member scheduled for:12/05/22 @ [place] RN, BSN [place] RN Care Coordinator   Following is a copy of your plan of care:  Care Plan : [facility] Plan of Care   Updates made by Patrick Montane, RN since 10/03/2022 12:00 AM     Problem: Chronic Disease Management & Care Coordination Needs for DMI & HTN      Long-Range Goal: Development of Plan of Care for Chronic Disease Management & Care Coordination Needs   Start Date: 07/14/2021  Expected End Date: 12/05/2022  Priority: High  Note:   Current Barriers:  Knowledge Deficits related to plan of care for management of  DM I and Prostate Issues Chronic Disease Management support and education needs related to  DM I and Prostate Issues  RNCM Clinical Goal(s):  Patient will verbalize understanding of plan for management of HTN and DM I and Prostate Issues as evidenced by good management of these chronic diseases verbalize basic understanding of HTN and DM I and Prostate Issues disease process and self health management plan as evidenced by good management of these chronic diseases take all medications exactly as prescribed and will call provider for medication related questions as evidenced by patient being medication compliant.    attend all scheduled medical appointments: 11/07/22 for Preop Assessment and 11/23/22 for Prostate Biopsy as evidenced by Provider documentation in EMR        demonstrate ongoing adherence to prescribed treatment plan for HTN and DM I and Prostate Issues as evidenced by noted improvement of chronic disease managment of these diseases. demonstrate ongoing health management independence as evidenced by good maintenance of HTN, DM I  and Prostate Issues.        continue to work with Consulting civil engineer and/or Social Worker to address care management and care coordination needs related  to HTN and DM I and Prostate Issues as evidenced by adherence to CM Team Scheduled appointments     work with [employer] care guide to address needs related to [employer] knowledge of community resource: Radiographer, therapeutic and Applying for Disability as evidenced by patient and/or community resource care  guide support    through collaboration with Medical illustrator, provider, and care team.   Interventions: Inter-disciplinary care team collaboration (see longitudinal plan of care) Evaluation of current treatment plan related to  self management and patient's adherence to plan as established by provider   Diabetes:  (Status: Goal on Track (progressing): YES.) Lab Results  Component Value Date   HGBA1C 7.7 (H) 09/02/2022    Lab Results  Component Value Date   HGBA1C 7.7 (H) 09/02/2022    Assessed patient's understanding of A1c goal: <7% Provided education to patient about basic DM disease process; Reviewed medications with patient and discussed importance of medication adherence;        Counseled on importance of regular laboratory monitoring as prescribed;        Discussed plans with patient for ongoing care management follow up and provided patient with direct contact information for care management team;           Reviewed scheduled/upcoming provider appointments including: 11/07/22 for Preop Assessment and 11/23/22 for Prostate biopsy Discussed food choices, encouraged patient to include protein with night time snack, examples provided Reviewed Dr. Remus Blake note, reviewed medication changes and diet recommendations              Prostate Issues:  (Status:  Goal on track:  Yes.)  Long Term Goal Evaluation of current treatment plan related to  Prostate Issues with elevated PSA level of 12.18 one month ago. , Lacks knowledge of community resource: dental resources for broken dentures and information regarding applying for disability,  self-management and patient's adherence to plan as established by provider. Discussed plans with patient for ongoing care management follow up and provided patient with direct contact information for care management team Evaluation of current treatment plan related to elevated PSA level to 12.18 and patient's adherence to plan as established by provider Reviewed  scheduled/upcoming provider appointments including Preop assessment on 11/07/22 and Prostate biopsy 11/23/22   Advised patient to contact Provider with concerns or questions    Patient Goals/Self-Care Activities: Patient will self administer medications as prescribed Patient will attend all scheduled provider appointments Patient will call pharmacy for medication refills Patient will continue to perform ADL's independently Patient will continue to perform IADL's independently Patient will call provider office for new concerns or questions

## 2022-10-03 NOTE — Patient Outreach (Signed)
Medicaid Managed Care   Nurse Care Manager Note  10/03/2022 Name:  QUANTRELL SPLITT MRN:  [id number] DOB:  04/24/1961  [name] is an 61 y.o. year old male who is a primary patient of [name], NP.  The [facility] Managed Care Coordination team was consulted for assistance with:    Prostate DMII  Mr. Kutz was given information about Medicaid Managed Care Coordination team services today. [name] Patient agreed to services and verbal consent obtained.  Engaged with patient by telephone for follow up visit in response to provider referral for case management and/or care coordination services.   Assessments/Interventions:  Review of past medical history, allergies, medications, health status, including review of consultants reports, laboratory and other test data, was performed as part of comprehensive evaluation and provision of chronic care management services.  SDOH (Social Determinants of Health) assessments and interventions performed: SDOH Interventions    Flowsheet Row Patient Outreach Telephone from 10/03/2022 in [place] POPULATION HEALTH DEPARTMENT Patient Outreach Telephone from 08/18/2022 in [facility] Community Care Coordination Patient Outreach Telephone from 07/18/2022 in [facility] Care Coordination Patient Outreach Telephone from 06/16/2022 in [facility] [employer] Care Coordination Patient Outreach Telephone from 07/14/2021 in [facility] [employer] Care Coordination  SDOH Interventions       Food Insecurity Interventions -- -- -- -- Intervention Not Indicated  Housing Interventions Intervention Not Indicated -- -- Intervention Not Indicated Intervention Not Indicated  Transportation Interventions -- Intervention Not Indicated -- Intervention Not Indicated Intervention Not Indicated  Utilities Interventions Intervention Not Indicated -- -- -- --  Financial Strain Interventions -- -- -- --  Intervention Not Indicated  Physical Activity Interventions -- -- Intervention Not Indicated -- Other (Comments)  [Encourage patient to continue walking everyday]  Stress Interventions -- -- -- -- Other (Comment)  [Patient likes to listen to music and/or read/work on cars when stressed]  Social Connections Interventions -- -- -- -- Intervention Not Indicated       Care Plan  No Known Allergies  Medications Reviewed Today     Reviewed by Heidi Dach, RN (Registered Nurse) on 10/03/22 at 0910  Med List Status: <None>   Medication Order Taking? Sig Documenting Provider Last Dose Status Informant  ACCU-CHEK AVIVA PLUS test strip [id number]  1 EACH BY OTHER ROUTE 4 (FOUR) TIMES DAILY  Patient not taking: Reported on 09/07/2022   Romero Belling, MD  Active   B-D UF III MINI PEN NEEDLES 31G X 5 MM MISC [id number]  USE 3 TIMES DAILY [name], Konrad Dolores, MD  Active   Blood Pressure Monitor DEVI [id number]  Use as directed to check home blood pressure at least 4 times a week. Please notify primary provider of blood pressures greater than 140/90. Please send prescription to [facility].  Phone Number [phone] Fax Number [phone] [name], NP  Active   Continuous Blood Gluc Receiver [facility] G7 [place]) [place] [phone]  Use to check blood sugar daily Reather Littler, MD  Active   Continuous Blood Gluc Sensor (DEXCOM G7 SENSOR) MISC [id number]  Change every 10 days Reather Littler, MD  Active   cycloSPORINE (RESTASIS) 0.05 % ophthalmic emulsion [id number]  Place 1 drop into both eyes 2 (two) times daily. [name], NP  Active Self  gabapentin (NEURONTIN) 300 MG capsule [id number]  TAKE 1 CAPSULE BY MOUTH EVERYDAY AT BEDTIME [name], NP  Active   insulin aspart (NOVOLOG FLEXPEN) 100 UNIT/ML FlexPen [id number]  4 times a day (just before each meal): 6-6-4-2 units Romero Belling, MD  Active   insulin glargine (LANTUS SOLOSTAR) 100 UNIT/ML Solostar Pen [id number]  Inject 8 Units  into the skin at bedtime. Romero Belling, MD  Active   latanoprost Harrel Lemon) 0.005 % ophthalmic solution [id number]  SMARTSIG:1 Drop(s) In Eye(s) Every Evening [provider]  Active   losartan-hydrochlorothiazide (HYZAAR) 100-25 MG tablet [id number]  Take 1 tablet by mouth daily. [name], NP  Active   Multiple Vitamin (MULTIVITAMIN WITH MINERALS) TABS tablet [id number]  Take 1 tablet by mouth daily. [provider]  Active Self  sildenafil (VIAGRA) 25 MG tablet [id number]  Take 1 tablet 1/2 hour to 1 hour prior to intercourse as needed. Limit use to 1/2 tablet or 1 tablet per 24 hours. 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) 03/22/2014   Cirrhosis of liver (HCC) 03/14/2014   Colon cancer screening 03/14/2014   Hypotension, unspecified 03/01/2014   DKA (diabetic ketoacidoses) 02/28/2014   Hyponatremia 02/28/2014   Hepatic encephalopathy (HCC) 08/24/2012   Anemia 08/23/2012   Malnutrition (HCC) 08/23/2012   Acute renal failure (HCC) 08/23/2012   Thrombocytopenia (HCC) 08/23/2012   Coagulopathy (HCC) 08/23/2012   Ascites 08/22/2012   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    Conditions to be addressed/monitored per PCP order:  DMII and Prostate Issues  Care Plan : [facility] Plan of Care  Updates made by Heidi Dach, RN since 10/03/2022 12:00 AM     Problem: Chronic Disease Management & Care Coordination Needs for DMI & HTN       Long-Range Goal: Development of Plan of Care for Chronic Disease Management & Care Coordination Needs   Start Date: 07/14/2021  Expected End Date: 12/05/2022  Priority: High  Note:   Current Barriers:  Knowledge Deficits related to plan of care for management of  DM I and Prostate Issues Chronic Disease Management support and education needs related to  DM I and Prostate Issues  RNCM Clinical Goal(s):  Patient will verbalize understanding of plan for management of HTN and DM I and Prostate Issues as evidenced by good management of these chronic diseases verbalize basic understanding of HTN and DM I and Prostate Issues disease process and self health management plan as evidenced by good management of these chronic diseases take all medications exactly as prescribed and will call provider for medication related questions as evidenced by patient being medication compliant.    attend all scheduled medical appointments: 11/07/22 for Preop Assessment and 11/23/22 for Prostate Biopsy as evidenced by Provider documentation in EMR        demonstrate ongoing adherence to prescribed treatment plan for HTN and DM I and Prostate Issues as evidenced by noted improvement of chronic disease managment of these diseases. demonstrate ongoing health management independence as evidenced by good maintenance of HTN, DM I  and Prostate Issues.        continue to work with Medical illustrator and/or Social Worker to address care management and care coordination needs related to HTN and DM I and Prostate Issues as evidenced by adherence to CM Team Scheduled appointments     work with [employer] care guide to address needs related to [employer] knowledge of community resource: Radiographer, therapeutic and Applying for Disability as evidenced by patient and/or community resource care guide support    through collaboration with Medical illustrator, provider, and care team.   Interventions: Inter-disciplinary care team collaboration (see  longitudinal plan of care) Evaluation of current treatment plan related to  self management and patient's adherence to plan as established by provider   Diabetes:  (Status: Goal on Track (progressing): YES.) Lab Results  Component Value Date   HGBA1C 7.7 (H) 09/02/2022    Lab Results  Component Value Date   HGBA1C 7.7 (H) 09/02/2022    Assessed patient's understanding of A1c goal: <7% Provided education to patient about basic DM disease process; Reviewed medications with patient and discussed importance of medication adherence;        Counseled on importance of regular laboratory monitoring as prescribed;        Discussed plans with patient for ongoing care management follow up and provided patient with direct contact information for care management team;           Reviewed scheduled/upcoming provider appointments including: 11/07/22 for Preop Assessment and 11/23/22 for Prostate biopsy Discussed food choices, encouraged patient to include protein with night time snack, examples provided Reviewed Dr. Remus Blake note, reviewed medication changes and diet recommendations              Prostate Issues:  (Status:  Goal on track:  Yes.)  Long Term Goal Evaluation of current treatment plan related to  Prostate Issues with elevated PSA level of 12.18 one month ago. , Lacks knowledge of community resource: dental resources for broken dentures and information regarding applying for disability,  self-management and patient's adherence to plan as established by provider. Discussed plans with patient for ongoing care management follow up and provided patient with direct contact information for care management team Evaluation of current treatment plan related to elevated PSA level to 12.18 and patient's adherence to plan as established by provider Reviewed scheduled/upcoming provider appointments including Preop assessment on 11/07/22 and Prostate biopsy 11/23/22   Advised patient to contact Provider with  concerns or questions    Patient Goals/Self-Care Activities: Patient will self administer medications as prescribed Patient will attend all scheduled provider appointments Patient will call pharmacy for medication refills Patient will continue to perform ADL's independently Patient will continue to perform IADL's independently Patient will call provider office for new concerns or questions       Follow Up:  Patient agrees to Care Plan and Follow-up.  Plan: The Managed Medicaid care management team will reach out to the patient again over the next 60 days.  Date/time of next scheduled RN care management/care coordination outreach:  12/05/22 @ 9am  [name] RN, BSN [place]  [facility]

## 2022-12-05 ENCOUNTER — Other Ambulatory Visit: Payer: Medicaid Other | Admitting: *Deleted

## 2022-12-05 ENCOUNTER — Encounter: Payer: Self-pay | Admitting: *Deleted

## 2022-12-05 NOTE — Patient Instructions (Signed)
Visit Information  Patrick [name] was given information about Medicaid Managed Care team care coordination services as a part of their [facility] Medicaid benefit. Patrick [name] verbally consented to engagement with the [facility] Managed Care team.   If you are experiencing a medical emergency, please call 911 or report to your local emergency department or urgent care.   If you have a non-emergency medical problem during routine business hours, please contact your provider's office and ask to speak with a nurse.   For questions related to your [facility] health plan, please call: [phone] or go here:[email or web address]  If you would like to schedule transportation through your [facility] plan, please call the following number at least 2 days in advance of your appointment: [phone].  You can also use the MTM portal or MTM mobile app to manage your rides. For the portal, please go to [email or web address].  Call the [place] at [phone], at any time, 24 hours a day, 7 days a week. If you are in danger or need immediate medical attention call 911.  If you would like help to quit smoking, call 1-800-QUIT-NOW 201 100 9789) OR Espaol: 1-855-Djelo-Ya (1-093-235-5732) o para ms informacin haga clic aqu or Text READY to 200-400 to register via text  Patrick [name],  Please see education materials related to Prostate Cancer provided by MyChart link.  Patient verbalizes understanding of instructions and care plan provided today and agrees to view in [place]. Active MyChart status and patient understanding of how to access instructions and care plan via MyChart confirmed with patient.     Telephone follow up appointment with Managed Medicaid care management team member scheduled for:01/12/23 @ [place] RN, [place] RN Care Coordinator   Following is a copy of your plan of care:  Care Plan : [facility] Plan of  Care  Updates made by Melissa Montane, RN since 12/05/2022 12:00 AM     Problem: Chronic Disease Management & Care Coordination Needs for DMI & HTN      Long-Range Goal: Development of Plan of Care for Chronic Disease Management & Care Coordination Needs   Start Date: 07/14/2021  Expected End Date: 01/19/2023  Priority: High  Note:   Current Barriers:  Knowledge Deficits related to plan of care for management of  DM I and Prostate Cancer Chronic Disease Management support and education needs related to  DM I and Prostate Cancer  RNCM Clinical Goal(s):  Patient will verbalize understanding of plan for management of HTN and DM I and Prostate Issues as evidenced by good management of these chronic diseases verbalize basic understanding of HTN and DM I and Prostate Issues disease process and self health management plan as evidenced by good management of these chronic diseases take all medications exactly as prescribed and will call provider for medication related questions as evidenced by patient being medication compliant.    attend all scheduled medical appointments: 11/07/22 for Preop Assessment and 11/23/22 for Prostate Biopsy as evidenced by Provider documentation in EMR        demonstrate ongoing adherence to prescribed treatment plan for HTN and DM I and Prostate Issues as evidenced by noted improvement of chronic disease managment of these diseases. demonstrate ongoing health management independence as evidenced by good maintenance of HTN, DM I  and Prostate Issues.        continue to work with Consulting civil engineer and/or Social Worker to address care management and care coordination needs related  to HTN and DM I and Prostate Issues as evidenced by adherence to CM Team Scheduled appointments     work with [employer] care guide to address needs related to [employer] knowledge of community resource: Corporate investment banker and Applying for Disability as evidenced by patient and/or community resource care  guide support    through collaboration with Consulting civil engineer, provider, and care team.   Interventions: Inter-disciplinary care team collaboration (see longitudinal plan of care) Evaluation of current treatment plan related to  self management and patient's adherence to plan as established by provider   Diabetes:  (Status: Goal on Track (progressing): YES.) Lab Results  Component Value Date   HGBA1C 7.7 (H) 09/02/2022    Lab Results  Component Value Date   HGBA1C 7.7 (H) 09/02/2022    Assessed patient's understanding of A1c goal: <7% Provided education to patient about basic DM disease process; Reviewed medications with patient and discussed importance of medication adherence;        Counseled on importance of regular laboratory monitoring as prescribed;        Discussed plans with patient for ongoing care management follow up and provided patient with direct contact information for care management team;           Reviewed scheduled/upcoming provider appointments including: 12/09/22 and 12/15/22 with [place] [facility], 12/27/22 with PCP and 01/10/23 with Endocrinology and Eye Exam Discussed food choices, encouraged patient to include protein with night time snack, examples provided Discussed using CGM and recent readings typically 120-150 with occasional reading of 200 Advised patient to exercise 30 minutes a day             Prostate Cancer:  (Status:  Goal on track:  Yes.)  Long Term Goal Evaluation of current treatment plan related to  Prostate Issues with elevated PSA level of 12.18 one month ago. , Lacks knowledge of community resource: dental resources for broken dentures and information regarding applying for disability,  self-management and patient's adherence to plan as established by provider. Discussed plans with patient for ongoing care management follow up and provided patient with direct contact information for care management team Evaluation of current treatment plan  related to elevated Prostate Cancer and patient's adherence to plan as established by provider Provided education to patient re: prostate cancer Assessed social determinant of health barriers Provided with [employer] [phone], transportation provided by [facility]   Advised patient to contact Provider with concerns or questions    Patient Goals/Self-Care Activities: Patient will self administer medications as prescribed Patient will attend all scheduled provider appointments Patient will call pharmacy for medication refills Patient will continue to perform ADL's independently Patient will continue to perform IADL's independently Patient will call provider office for new concerns or questions

## 2022-12-05 NOTE — Patient Outreach (Signed)
Medicaid Managed Care   Nurse Care Manager Note  12/05/2022 Name:  Patrick [name] MRN:  [id number] DOB:  1960-12-22  Patrick [name] is an 62 y.o. year old male who is a primary patient of Patrick Herter, NP.  The [facility] Managed Care Coordination team was consulted for assistance with:    DMI Prostate Cancer  Patrick [name] was given information about Medicaid Managed Care Coordination team services today. Patrick [name] Patient agreed to services and verbal consent obtained.  Engaged with patient by telephone for follow up visit in response to provider referral for case management and/or care coordination services.   Assessments/Interventions:  Review of past medical history, allergies, medications, health status, including review of consultants reports, laboratory and other test data, was performed as part of comprehensive evaluation and provision of chronic care management services.  SDOH (Social Determinants of Health) assessments and interventions performed: SDOH Interventions    Flowsheet Row Patient Outreach Telephone from 12/05/2022 in [place] Patient Outreach Telephone from 10/03/2022 in [place] Patient Outreach Telephone from 08/18/2022 in [place] Coordination Patient Outreach Telephone from 07/18/2022 in [place] Patient Outreach Telephone from 06/16/2022 in [place] Patient Outreach Telephone from 07/14/2021 in [place] Interventions        Food Insecurity Interventions -- -- -- -- -- Intervention Not Indicated  Housing Interventions -- Intervention Not Indicated -- -- Intervention Not Indicated Intervention Not Indicated  Transportation Interventions Other (Comment)  [Provided with [employer] [phone], provided by [employer]] -- Intervention Not  Indicated -- Intervention Not Indicated Intervention Not Indicated  Utilities Interventions -- Intervention Not Indicated -- -- -- --  Financial Strain Interventions -- -- -- -- -- Intervention Not Indicated  Physical Activity Interventions -- -- -- Intervention Not Indicated -- Other (Comments)  [Encourage patient to continue walking everyday]  Stress Interventions -- -- -- -- -- Other (Comment)  [Patient likes to listen to music and/or read/work on cars when stressed]  Social Connections Interventions -- -- -- -- -- Intervention Not Indicated       Care Plan  No Known Allergies  Medications Reviewed Today     Reviewed by Patrick Montane, RN (Registered Nurse) on 12/05/22 at [phone]  Med List Status: <None>   Medication Order Taking? Sig Documenting Provider Last Dose Status Informant  B-D UF III MINI PEN NEEDLES 31G X 5 MM MISC [id number] No USE 3 TIMES DAILY [name], Patrick Schuchart Crazier, MD Taking Active   Blood Pressure Monitor DEVI [id number] No Use as directed to check home blood pressure at least 4 times a week. Please notify primary provider of blood pressures greater than 140/90. Please send prescription to [facility].  Phone Number [phone] Fax Number [phone] Patrick Herter, NP Taking Active   Continuous Blood Gluc Receiver [facility] G7 [place]) DEVI [id number] No Use to check blood sugar daily Patrick Snare, MD Taking Active   Continuous Blood Gluc Sensor ([place]) MISC [id number] No Change every 10 days Patrick Snare, MD Taking Active   cycloSPORINE (RESTASIS) 0.05 % ophthalmic emulsion [id number] No Place 1 drop into both eyes 2 (two) times daily. Patrick Herter, NP Taking Active Self  gabapentin (NEURONTIN) 300 MG capsule [id number] No TAKE 1 CAPSULE BY MOUTH EVERYDAY AT BEDTIME Patrick Herter, NP Taking Active   glucose blood (ACCU-CHEK AVIVA PLUS) test strip [id number]  1 EACH BY OTHER ROUTE 4 (FOUR) TIMES DAILY Patrick Littler, MD  Active   insulin aspart (NOVOLOG  FLEXPEN) 100 UNIT/ML FlexPen [id number] No 4 times a day (just before each meal): 6-6-4-2 units Patrick Belling, MD Taking Active   insulin glargine (LANTUS SOLOSTAR) 100 UNIT/ML Solostar Pen [id number] No Inject 8 Units into the skin at bedtime. Patrick Belling, MD Taking Active   latanoprost (XALATAN) 0.005 % ophthalmic solution [id number] No SMARTSIG:1 Drop(s) In Eye(s) Every Evening [provider] Taking Active   losartan-hydrochlorothiazide (HYZAAR) 100-25 MG tablet [id number] No Take 1 tablet by mouth daily. Patrick Fendt, NP Taking Active   Multiple Vitamin (MULTIVITAMIN WITH MINERALS) TABS tablet [id number] No Take 1 tablet by mouth daily. [provider] Taking Active Self  sildenafil (VIAGRA) 25 MG tablet [id number] No Take 1 tablet 1/2 hour to 1 hour prior to intercourse as needed. Limit use to 1/2 tablet or 1 tablet per 24 hours.  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) 03/22/2014   Cirrhosis of liver (HCC) 03/14/2014   Colon cancer screening 03/14/2014   Hypotension, unspecified 03/01/2014   DKA (diabetic ketoacidoses) 02/28/2014   Hyponatremia 02/28/2014   Hepatic encephalopathy (HCC) 08/24/2012   Anemia 08/23/2012   Malnutrition (HCC) 08/23/2012   Acute renal failure (HCC) 08/23/2012   Thrombocytopenia (HCC) 08/23/2012   Coagulopathy (HCC) 08/23/2012   Ascites 08/22/2012   Alcoholic  cirrhosis of liver with ascites 08/21/2012    Conditions to be addressed/monitored per PCP order:   DM and Prostate Cancer  Care Plan : [facility] Plan of Care  Updates made by Patrick Dach, RN since 12/05/2022 12:00 AM     Problem: Chronic Disease Management & Care Coordination Needs for DMI & HTN      Long-Range Goal: Development of Plan of Care for Chronic Disease Management & Care Coordination Needs   Start Date: 07/14/2021  Expected End Date: 01/19/2023  Priority: High  Note:   Current Barriers:  Knowledge Deficits related to plan of care for management of  DM I and Prostate Cancer Chronic Disease Management support and education needs related to  DM I and Prostate Cancer  RNCM Clinical Goal(s):  Patient will verbalize understanding of plan for management of HTN and DM I and Prostate Issues as evidenced by good management of these chronic diseases verbalize basic understanding of HTN and DM I and Prostate Issues disease process and self health management plan as evidenced by good management of these chronic diseases take all medications exactly as prescribed and will call provider for medication related questions as evidenced by patient being medication compliant.    attend all scheduled medical appointments: 11/07/22 for Preop Assessment and 11/23/22 for Prostate Biopsy as evidenced by Provider documentation in EMR        demonstrate ongoing adherence to prescribed treatment  plan for HTN and DM I and Prostate Issues as evidenced by noted improvement of chronic disease managment of these diseases. demonstrate ongoing health management independence as evidenced by good maintenance of HTN, DM I  and Prostate Issues.        continue to work with Consulting civil engineer and/or Social Worker to address care management and care coordination needs related to HTN and DM I and Prostate Issues as evidenced by adherence to CM Team Scheduled appointments     work with [employer] care guide to address  needs related to [employer] knowledge of community resource: Corporate investment banker and Applying for Disability as evidenced by patient and/or community resource care guide support    through collaboration with Consulting civil engineer, provider, and care team.   Interventions: Inter-disciplinary care team collaboration (see longitudinal plan of care) Evaluation of current treatment plan related to  self management and patient's adherence to plan as established by provider   Diabetes:  (Status: Goal on Track (progressing): YES.) Lab Results  Component Value Date   HGBA1C 7.7 (H) 09/02/2022    Lab Results  Component Value Date   HGBA1C 7.7 (H) 09/02/2022    Assessed patient's understanding of A1c goal: <7% Provided education to patient about basic DM disease process; Reviewed medications with patient and discussed importance of medication adherence;        Counseled on importance of regular laboratory monitoring as prescribed;        Discussed plans with patient for ongoing care management follow up and provided patient with direct contact information for care management team;           Reviewed scheduled/upcoming provider appointments including: 12/09/22 and 12/15/22 with [facility], 12/27/22 with PCP and 01/10/23 with Endocrinology and Eye Exam Discussed food choices, encouraged patient to include protein with night time snack, examples provided Discussed using CGM and recent readings typically 120-150 with occasional reading of 200 Advised patient to exercise 30 minutes a day             Prostate Cancer:  (Status:  Goal on track:  Yes.)  Long Term Goal Evaluation of current treatment plan related to  Prostate Issues with elevated PSA level of 12.18 one month ago. , Lacks knowledge of community resource: dental resources for broken dentures and information regarding applying for disability,  self-management and patient's adherence to plan as established by provider. Discussed plans with patient  for ongoing care management follow up and provided patient with direct contact information for care management team Evaluation of current treatment plan related to elevated Prostate Cancer and patient's adherence to plan as established by provider Provided education to patient re: prostate cancer Assessed social determinant of health barriers Provided with [employer] [phone], transportation provided by [facility]   Advised patient to contact Provider with concerns or questions    Patient Goals/Self-Care Activities: Patient will self administer medications as prescribed Patient will attend all scheduled provider appointments Patient will call pharmacy for medication refills Patient will continue to perform ADL's independently Patient will continue to perform IADL's independently Patient will call provider office for new concerns or questions       Follow Up:  Patient agrees to Care Plan and Follow-up.  Plan: The Managed Medicaid care management team will reach out to the patient again over the next 30 days.  Date/time of next scheduled RN care management/care coordination outreach:  01/12/23 @ [place], [place]  RN Care Coordinator

## 2022-12-26 NOTE — Progress Notes (Unsigned)
Patient ID: Patrick [name], male    DOB: 11-29-1960  MRN: [id number]  CC: Chronic Care Management   Subjective: Patrick [name] is a 62 y.o. male who presents for chronic care management.   His concerns today include:  Endo - DM1   HTN - Losartan-Hydrochlorothiazide   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[place]) 03/22/2014   Cirrhosis of liver ([place]) 03/14/2014   Colon cancer screening 03/14/2014   Hypotension, unspecified 03/01/2014   DKA (diabetic ketoacidoses) 02/28/2014   Hyponatremia 02/28/2014   Hepatic encephalopathy ([place]) 08/24/2012   Anemia 08/23/2012   Malnutrition ([place]) 08/23/2012   Acute renal failure ([address]) 08/23/2012   Thrombocytopenia ([place]) 08/23/2012   Coagulopathy ([place]) 08/23/2012   Ascites 62/22/9798   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     Current Outpatient Medications on File Prior to Visit  Medication Sig Dispense Refill   B-D UF III MINI PEN NEEDLES 31G X 5 MM MISC USE 3 TIMES DAILY 300 each 1   Blood Pressure Monitor DEVI Use as directed to check home blood pressure at least 4 times a week. Please notify primary provider of blood pressures greater than 140/90. Please send prescription to [facility].  Phone Number [phone] Fax Number [phone] each 0   Continuous Blood Gluc Receiver (DEXCOM G7 RECEIVER) DEVI Use to check blood sugar daily 1 each 0   Continuous Blood Gluc Sensor (DEXCOM G7 SENSOR) MISC Change every 10 days 9 each 2   cycloSPORINE (RESTASIS) 0.05 % ophthalmic emulsion Place 1 drop into both eyes 2 (two) times daily.     gabapentin (NEURONTIN) 300 MG capsule TAKE 1 CAPSULE BY MOUTH EVERYDAY AT BEDTIME 90 capsule 3   glucose blood (ACCU-CHEK AVIVA PLUS) test  strip 1 EACH BY OTHER ROUTE 4 (FOUR) TIMES DAILY 100 strip 13   insulin aspart (NOVOLOG FLEXPEN) 100 UNIT/ML FlexPen 4 times a day (just before each meal): 6-6-4-2 units 30 mL 3   insulin glargine (LANTUS SOLOSTAR) 100 UNIT/ML Solostar Pen Inject 8 Units into the skin at bedtime. 15 mL PRN   latanoprost (XALATAN) 0.005 % ophthalmic solution SMARTSIG:1 Drop(s) In Eye(s) Every Evening     losartan-hydrochlorothiazide (HYZAAR) 100-25 MG tablet Take 1 tablet by mouth daily. 30 tablet 2   Multiple Vitamin (MULTIVITAMIN WITH MINERALS) TABS tablet Take 1 tablet by mouth daily.     sildenafil (VIAGRA) 25 MG tablet Take 1 tablet 1/2 hour to 1 hour prior to intercourse as needed. Limit use to 1/2 tablet or 1 tablet per 24 hours. (Patient not taking: Reported on 10/03/2022) 20 tablet 0   simvastatin (ZOCOR) 20 MG tablet TAKE 1 TABLET BY MOUTH EVERYDAY AT BEDTIME 90 tablet 3   No current facility-administered medications on file prior to visit.    No Known Allergies  Social History   Socioeconomic History   Marital status: Single    Spouse name: Not on file   Number of children: 1   Years of education: Not on file   Highest education level: Not on file  Occupational History   Occupation: unemployed  Tobacco Use   Smoking status: Former    Packs/day: 0.00    Years: 15.00    Total pack years: 0.00    Types: Cigarettes  Quit date: 07/14/2010    Years since quitting: 12.4    Passive exposure: Past   Smokeless tobacco: Never  Substance and Sexual Activity   Alcohol use: No    Comment: Not using for last 11 years   Drug use: No   Sexual activity: Not on file  Other Topics Concern   Not on file  Social History Narrative   Not on file   Social Determinants of Health   Financial Resource Strain: Low Risk  (07/14/2021)   Overall Financial Resource Strain (CARDIA)    Difficulty of Paying Living Expenses: Not very hard  Food Insecurity: No Food Insecurity (07/14/2021)   Hunger Vital Sign     Worried About Running Out of Food in the Last Year: Never true    Ran Out of Food in the Last Year: Never true  Transportation Needs: No Transportation Needs (12/05/2022)   PRAPARE - Hydrologist (Medical): No    Lack of Transportation (Non-Medical): No  Physical Activity: Sufficiently Active (07/18/2022)   Exercise Vital Sign    Days of Exercise per Week: 5 days    Minutes of Exercise per Session: 30 min  Stress: Stress Concern Present (07/14/2021)   [place]    Feeling of Stress : To some extent  Social Connections: Unknown (07/14/2021)   Social Connection and Isolation Panel [NHANES]    Frequency of Communication with Friends and Family: More than three times a week    Frequency of Social Gatherings with Friends and Family: More than three times a week    Attends Religious Services: Not on Advertising copywriter or Organizations: No    Attends Archivist Meetings: Never    Marital Status: Living with partner  Intimate Partner Violence: Not on file    Family History  Problem Relation Age of Onset   Heart disease Mother        bi-pass   Hypertension Mother    Cancer Brother        unsure type   Hypertension Father    Colon cancer Neg Hx     Past Surgical History:  Procedure Laterality Date   EYE SURGERY N/A    [employer] 02/21/2021   MOUTH SURGERY     on gums   TONSILLECTOMY      ROS: Review of Systems Negative except as stated above  PHYSICAL EXAM: There were no vitals taken for this visit.  Physical Exam  {male adult master:310786} {male adult master:310785}     Latest Ref Rng & Units 09/02/2022    8:08 AM 06/17/2022    8:18 AM 05/30/2022    9:38 AM  CMP  Glucose 70 - 99 mg/dL 111  128  198   BUN 6 - 23 mg/dL 20  23  17    Creatinine 0.40 - 1.50 mg/dL 1.30  1.38  1.27   Sodium 135 - 145 mEq/L 137  137  140   Potassium 3.5 - 5.1 mEq/L 4.1  4.2   4.4   Chloride 96 - 112 mEq/L 100  97  98   CO2 19 - 32 mEq/L 31  31  24    Calcium 8.4 - 10.5 mg/dL 9.7  9.8  9.9   Total Protein 6.0 - 8.3 g/dL 8.2  8.1    Total Bilirubin 0.2 - 1.2 mg/dL 0.4  0.5    Alkaline Phos 39 - 117 U/L 75  71    AST 0 - 37 U/L 23  20    ALT 0 - 53 U/L 30  21     Lipid Panel     Component Value Date/Time   CHOL 132 09/02/2022 0808   TRIG 155.0 (H) 09/02/2022 0808   HDL 34.20 (L) 09/02/2022 0808   CHOLHDL 4 09/02/2022 0808   VLDL 31.0 09/02/2022 0808   LDLCALC 67 09/02/2022 0808   LDLDIRECT 113.0 06/17/2022 0818    CBC    Component Value Date/Time   WBC 4.2 04/07/2021 1130   WBC 10.0 01/22/2021 1131   RBC 4.83 04/07/2021 1130   RBC 3.40 (L) 01/22/2021 1131   HGB 14.4 04/07/2021 1130   HCT 43.2 04/07/2021 1130   PLT 181 04/07/2021 1130   MCV 89 04/07/2021 1130   MCH 29.8 04/07/2021 1130   MCH 32.1 01/22/2021 1131   MCHC 33.3 04/07/2021 1130   MCHC 34.9 01/22/2021 1131   RDW 11.8 04/07/2021 1130   LYMPHSABS 2.1 01/22/2021 1131   MONOABS 0.7 01/22/2021 1131   EOSABS 0.0 01/22/2021 1131   BASOSABS 0.0 01/22/2021 1131    ASSESSMENT AND PLAN:  There are no diagnoses linked to this encounter.   Patient was given the opportunity to ask questions.  Patient verbalized understanding of the plan and was able to repeat key elements of the plan. Patient was given clear instructions to go to Emergency Department or return to medical center if symptoms don't improve, worsen, or new problems develop.The patient verbalized understanding.   No orders of the defined types were placed in this encounter.    Requested Prescriptions    No prescriptions requested or ordered in this encounter    No follow-ups on file.  [name], NP

## 2022-12-27 ENCOUNTER — Ambulatory Visit (INDEPENDENT_AMBULATORY_CARE_PROVIDER_SITE_OTHER): Payer: Medicaid Other | Admitting: Family

## 2022-12-27 ENCOUNTER — Encounter: Payer: Self-pay | Admitting: Family

## 2022-12-27 VITALS — BP 136/72 | HR 99 | Temp 98.3°F | Resp 16 | Ht 70.98 in | Wt 170.0 lb

## 2022-12-27 DIAGNOSIS — I10 Essential (primary) hypertension: Secondary | ICD-10-CM

## 2022-12-27 MED ORDER — LOSARTAN POTASSIUM-HCTZ 100-25 MG PO TABS: 1.0000 | ORAL_TABLET | Freq: Every day | ORAL | 2 refills | Status: DC

## 2022-12-27 NOTE — Progress Notes (Signed)
.  Pt presents for chronic care management   

## 2023-01-04 ENCOUNTER — Other Ambulatory Visit: Payer: Self-pay | Admitting: Internal Medicine

## 2023-01-10 ENCOUNTER — Ambulatory Visit (INDEPENDENT_AMBULATORY_CARE_PROVIDER_SITE_OTHER): Payer: Medicaid Other | Admitting: Endocrinology

## 2023-01-10 VITALS — BP 134/80 | HR 76 | Ht 70.0 in | Wt 176.0 lb

## 2023-01-10 DIAGNOSIS — I10 Essential (primary) hypertension: Secondary | ICD-10-CM

## 2023-01-10 DIAGNOSIS — E1065 Type 1 diabetes mellitus with hyperglycemia: Secondary | ICD-10-CM

## 2023-01-10 LAB — POCT GLYCOSYLATED HEMOGLOBIN (HGB A1C): Hemoglobin A1C: 7.8 % — AB (ref 4.0–5.6)

## 2023-01-10 MED ORDER — METFORMIN HCL 500 MG PO TABS: 500.0000 mg | ORAL_TABLET | Freq: Two times a day (BID) | ORAL | 3 refills | Status: DC

## 2023-01-10 NOTE — Progress Notes (Signed)
Patient ID: Patrick [name], male   DOB: 03-03-1961, 62 y.o.   MRN: [id number]           Reason for Appointment: Type II Diabetes follow-up   History of Present Illness   Diagnosis date: 2015   Previous history:  Non-insulin hypoglycemic drugs previously used: Unknown Insulin was started in 2015, however was off insulin between 2018 and 2020  A1c range in the last few years is: 6.3-8.1  Recent history:     Non-insulin hypoglycemic drugs: None     Insulin regimen: NovoLog 4 units with breakfast and lunch, 4 units before dinner  Lantus 8 units at bedtime    Side effects from medications: None  Current self management, blood sugar patterns and problems identified:  A1c is 7.8 and about the same  His sugars have been consistently higher overnight [place] was given metformin to try and although he did finally start this he felt it was dropping his sugars too low and he stopped this without letting us know Again his blood sugars are mostly higher overnight especially early part of the night He is also not aware of how to adjust his NovoLog based on meal size Periodically will eat salads in the evenings and this will cause his blood sugars to be dropping with discontinuing 4 units of NovoLog  He has periodically very active days and he thinks blood sugars may drop when he is more active but he does not make any adjustments to insulin or snacks with activity He has cut back a little on the NovoLog at breakfast, previously taking 6 units Currently highest blood sugars after meals are after lunch  Exercise: Trying to walk fairly regularly  Diet management: Breakfast usually egg, potatoes or other starch, meat Dinner usually 6 pm  Dexcom CGM analysis for the last 7-8 days is interpreted as below  Compared to last visit his time in range is not as good OVERNIGHT blood sugars are generally high and usually averaging in the 170s with some variability and only occasionally going up much  higher overnight  No hypoglycemia overnight and has several artifacts on 2/11 and 2/12 which are falsely low Pre-meal blood sugars are averaging mostly about 150 during the day Postprandial readings are somewhat variable but frequently higher after lunch Occasionally may have low normal readings after dinner and 8-9 PM Hypoglycemia not present  CGM use % of time   2-week average/GV 159/26  Time in range   74     %  % Time Above 180 26  % Time above 250   % Time Below 70      PRE-MEAL Fasting Lunch Dinner Bedtime Overall  Glucose range:       Averages: 168       POST-MEAL PC Breakfast PC Lunch PC Dinner  Glucose range:     Averages:  177    Previously  CGM use % of time 99  2-week average/GV 138/18  Time in range     94   %  % Time Above 180 6  % Time above 250   % Time Below 70 0     Dietician visit: Most recent: Years ago     Weight control:  Wt Readings from Last 3 Encounters:  01/10/23 176 lb (79.8 kg)  12/27/22 170 lb (77.1 kg)  09/26/22 170 lb (77.1 kg)            Diabetes labs:  Lab Results  Component Value Date  HGBA1C 7.8 (A) 01/10/2023   HGBA1C 7.7 (H) 09/02/2022   HGBA1C 7.8 (H) 06/17/2022   Lab Results  Component Value Date   MICROALBUR <0.7 06/17/2022   LDLCALC 67 09/02/2022   CREATININE 1.30 09/02/2022    No results found for: "FRUCTOSAMINE"   Allergies as of 01/10/2023   No Known Allergies      Medication List        Accurate as of January 10, 2023 10:53 AM. If you have any questions, ask your nurse or doctor.          Accu-Chek Aviva Plus test strip Generic drug: glucose blood 1 EACH BY OTHER ROUTE 4 (FOUR) TIMES DAILY   B-D UF III MINI PEN NEEDLES 31G X 5 MM Misc Generic drug: Insulin Pen Needle USE 3 TIMES DAILY   Blood Pressure Monitor Devi Use as directed to check home blood pressure at least 4 times a week. Please notify primary provider of blood pressures greater than 140/90. Please send prescription to  [facility].  Phone Number [phone] Fax Number [phone]   cyanocobalamin 100 MCG tablet Take 1 tablet by mouth daily.   cycloSPORINE 0.05 % ophthalmic emulsion Commonly known as: RESTASIS Place 1 drop into both eyes 2 (two) times daily.   Dexcom G7 Receiver Devi Use to check blood sugar daily   Dexcom G7 Sensor Misc Change every 10 days   gabapentin 300 MG capsule Commonly known as: NEURONTIN TAKE 1 CAPSULE BY MOUTH EVERYDAY AT BEDTIME   Lantus SoloStar 100 UNIT/ML Solostar Pen Generic drug: insulin glargine Inject 8 Units into the skin at bedtime.   latanoprost 0.005 % ophthalmic solution Commonly known as: XALATAN SMARTSIG:1 Drop(s) In Eye(s) Every Evening   losartan-hydrochlorothiazide 100-25 MG tablet Commonly known as: HYZAAR Take 1 tablet by mouth daily.   multivitamin capsule Take 1 capsule by mouth daily.   multivitamin with minerals Tabs tablet Take 1 tablet by mouth daily.   NovoLOG FlexPen 100 UNIT/ML FlexPen Generic drug: insulin aspart 4 times a day (just before each meal): 6-6-4-2 units   sildenafil 25 MG tablet Commonly known as: VIAGRA Take 1 tablet 1/2 hour to 1 hour prior to intercourse as needed. Limit use to 1/2 tablet or 1 tablet per 24 hours.   simvastatin 20 MG tablet Commonly known as: ZOCOR TAKE 1 TABLET BY MOUTH EVERYDAY AT BEDTIME   Vitamin D-1000 Max St 25 MCG (1000 UT) tablet Generic drug: Cholecalciferol Take 1,000 Units by mouth daily.        Allergies: No Known Allergies  Past Medical History:  Diagnosis Date   Cirrhosis ([place])    Diabetes ([place])    type 1   Diabetes mellitus without complication ([place])    [employer]reesia 02/21/2021   ETOH abuse     Past Surgical History:  Procedure Laterality Date   EYE SURGERY N/A    Phreesia 02/21/2021   MOUTH SURGERY     on gums   TONSILLECTOMY      Family History  Problem Relation Age of Onset   Heart disease Mother        bi-pass   Hypertension Mother     Cancer Brother        unsure type   Hypertension Father    Colon cancer Neg Hx     Social History:  reports that he quit smoking about 12 years ago. His smoking use included cigarettes. He has been exposed to tobacco smoke. He has never used smokeless tobacco. He reports that  he does not drink alcohol and does not use drugs.  Review of Systems:  Last diabetic eye exam date 9/22  Last foot exam date: 10/22  Symptoms of neuropathy: None  Hypertension: Controlled, Treatment includes losartan  BP Readings from Last 3 Encounters:  01/10/23 134/80  12/27/22 136/72  09/26/22 136/72    Lipid management: On Zocor 20 mg daily since 8/23 He also has followed his diet LDL and triglycerides are controlled HDL low    Lab Results  Component Value Date   CHOL 132 09/02/2022   CHOL 196 06/17/2022   CHOL 155 03/01/2014   Lab Results  Component Value Date   HDL 34.20 (L) 09/02/2022   HDL 30.20 (L) 06/17/2022   HDL 30 (L) 03/01/2014   Lab Results  Component Value Date   LDLCALC 67 09/02/2022   LDLCALC 105 (H) 03/01/2014   Lab Results  Component Value Date   TRIG 155.0 (H) 09/02/2022   TRIG 303.0 (H) 06/17/2022   TRIG 101 03/01/2014   Lab Results  Component Value Date   CHOLHDL 4 09/02/2022   CHOLHDL 6 06/17/2022   CHOLHDL 5.2 03/01/2014   Lab Results  Component Value Date   LDLDIRECT 113.0 06/17/2022   Has a PSA of 13 and is going to get prostatectomy   Examination:   BP 134/80 (BP Location: Left Arm, Patient Position: Sitting, Cuff Size: Normal)   Pulse 76   Ht 5' 10"$  (1.778 m)   Wt 176 lb (79.8 kg)   SpO2 98%   BMI 25.25 kg/m   Body mass index is 25.25 kg/m.    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  Current regimen: Low-dose basal bolus insulin  See history of present illness for detailed discussion of current diabetes management, blood sugar patterns and problems identified  A1c is 7.8  Blood glucose control is not as good with time in range only  74 compared to 94 previously This is either from high readings overnight or after lunch Not able to increase his Lantus because of periodically low normal readings before meals and increased activity level during the day at times He is also not adjusting the mealtime coverage based on what he is eating This was discussed in detail  Recommendations:  Trial of METFORMIN 500 mg regular tablets for at least 1 week and then try 1000 mg at dinnertime if tolerated and blood sugars are still higher overnight Reduce Lantus to 6 units while starting metformin and adjust further based on overnight readings Fasting blood sugar target should be 100-140 May skip NovoLog when he is planning to be very active or eating only salads in the evening Call if having difficulties with control If he is going to be hospitalized for prostatectomy he can still continue to take the same dose of Lantus the night before   HYPERTENSION: Mild and well-controlled   There are no Patient Instructions on file for this visit.   [name] 01/10/2023, 10:53 AM

## 2023-01-10 NOTE — Patient Instructions (Addendum)
Metformin 1 daily for 5-7 days with supper then 2 at supper  No Novolog for salads  Lantus 6 at night

## 2023-01-11 ENCOUNTER — Encounter: Payer: Self-pay | Admitting: Endocrinology

## 2023-01-12 ENCOUNTER — Other Ambulatory Visit: Payer: Medicaid Other | Admitting: *Deleted

## 2023-01-12 ENCOUNTER — Encounter: Payer: Self-pay | Admitting: *Deleted

## 2023-01-12 NOTE — Patient Instructions (Signed)
Visit Information  Patrick [name] was given information about Medicaid Managed Care team care coordination services as a part of their [facility] Medicaid benefit. Patrick [name] verbally consented to engagement with the [facility] Managed Care team.   If you are experiencing a medical emergency, please call 911 or report to your local emergency department or urgent care.   If you have a non-emergency medical problem during routine business hours, please contact your provider's office and ask to speak with a nurse.   For questions related to your [facility] health plan, please call: [phone] or go here:[email or web address]  If you would like to schedule transportation through your [facility] plan, please call the following number at least 2 days in advance of your appointment: [phone].  You can also use the MTM portal or MTM mobile app to manage your rides. For the portal, please go to [email or web address].  Call the [place] at [phone], at any time, 24 hours a day, 7 days a week. If you are in danger or need immediate medical attention call 911.  If you would like help to quit smoking, call 1-800-QUIT-NOW 825-094-1460) OR Espaol: 1-855-Djelo-Ya QO:409462) o para ms informacin haga clic aqu or Text READY to 200-400 to register via text  Patrick [name],   Please see education materials related to DM provided by MyChart link.  Patient verbalizes understanding of instructions and care plan provided today and agrees to view in [place]. Active MyChart status and patient understanding of how to access instructions and care plan via MyChart confirmed with patient.     Telephone follow up appointment with Managed Medicaid care management team member scheduled for:01/25/23 @ 3:30pm  Lurena Joiner RN, BSN [place] RN Care Coordinator   Following is a copy of your plan of care:  Care Plan : [facility] Plan of Care   Updates made by Melissa Montane, RN since 01/12/2023 12:00 AM     Problem: Chronic Disease Management & Care Coordination Needs for DMI & HTN      Long-Range Goal: Development of Plan of Care for Chronic Disease Management & Care Coordination Needs   Start Date: 07/14/2021  Expected End Date: 02/17/2023  Priority: High  Note:   Current Barriers:  Knowledge Deficits related to plan of care for management of  DM I and Prostate Cancer Chronic Disease Management support and education needs related to  DM I and Prostate Cancer  Patrick [name] is scheduled for Prostatectomy on 01/20/23, he attended preop on 01/11/23. He is working to better manage his diabetes. Attempted to incorporate Metformin in the evening to help with overnight BS elevations. He did not tolerate metformin, has not notified Dr. Dwyane Dee. He is having difficulty getting refill on generic Restasis prescribed by Dr. Katy Fitch.  RNCM Clinical Goal(s):  Patient will verbalize understanding of plan for management of HTN and DM I and Prostate Issues as evidenced by good management of these chronic diseases verbalize basic understanding of HTN and DM I and Prostate Issues disease process and self health management plan as evidenced by good management of these chronic diseases take all medications exactly as prescribed and will call provider for medication related questions as evidenced by patient being medication compliant.    attend all scheduled medical appointments: 11/07/22 for Preop Assessment and 11/23/22 for Prostate Biopsy as evidenced by Provider documentation in EMR        demonstrate ongoing adherence to prescribed treatment plan for HTN and DM I and  Prostate Issues as evidenced by noted improvement of chronic disease managment of these diseases. demonstrate ongoing health management independence as evidenced by good maintenance of HTN, DM I  and Prostate Issues.        continue to work with Consulting civil engineer and/or Social Worker to address care  management and care coordination needs related to HTN and DM I and Prostate Issues as evidenced by adherence to CM Team Scheduled appointments     work with [employer] care guide to address needs related to [employer] knowledge of community resource: Corporate investment banker and Applying for Disability as evidenced by patient and/or community resource care guide support    through collaboration with Consulting civil engineer, provider, and care team.   Interventions: Inter-disciplinary care team collaboration (see longitudinal plan of care) Evaluation of current treatment plan related to  self management and patient's adherence to plan as established by provider Advised patient to contact Dr. Katy Fitch for new prescription for Restasis-name brand is on the [facility] list as a preferred medication Advised patient to contact Social Security office for financial questions   Diabetes:  (Status: Goal on Track (progressing): YES.) Lab Results  Component Value Date   HGBA1C 7.7 (H) 09/02/2022    Lab Results  Component Value Date   HGBA1C 7.7 (H) 09/02/2022    Assessed patient's understanding of A1c goal: <7% Provided education to patient about basic DM disease process; Reviewed medications with patient and discussed importance of medication adherence;        Counseled on importance of regular laboratory monitoring as prescribed;        Discussed plans with patient for ongoing care management follow up and provided patient with direct contact information for care management team;           Reviewed scheduled/upcoming provider appointments including: 01/20/23 for Prostatectomy at [facility], 03/20/23 for labs, 03/23/23 with Dr. Dwyane Dee and 03/27/23 with PCP Discussed food choices, encouraged patient to include protein with night time snack, specifically [place] Yogurt with no sugar, example provided Advised patient to exercise 30 minutes a day   Advised patient to notify Dr. Dwyane Dee via MyChart messaging regarding metformin intolerance            Prostate Cancer:  (Status:  Goal on track:  Yes.)  Long Term Goal Evaluation of current treatment plan related to  Prostate Issues with elevated PSA level of 12.18 one month ago. , Lacks knowledge of community resource: dental resources for broken dentures and information regarding applying for disability,  self-management and patient's adherence to plan as established by provider. Discussed plans with patient for ongoing care management follow up and provided patient with direct contact information for care management team Evaluation of current treatment plan related to elevated Prostate Cancer and patient's adherence to plan as established by provider Assessed social determinant of health barriers  Advised patient to contact Provider with concerns or questions Reviewed surgery schedule Verified patient has transportation and help at home post surgery    Patient Goals/Self-Care Activities: Patient will self administer medications as prescribed Patient will attend all scheduled provider appointments Patient will call pharmacy for medication refills Patient will continue to perform ADL's independently Patient will continue to perform IADL's independently Patient will call provider office for new concerns or questions

## 2023-01-12 NOTE — Patient Outreach (Signed)
Medicaid Managed Care   Nurse Care Manager Note  01/12/2023 Name:  Patrick [name] MRN:  [id number] DOB:  04/07/1961  Patrick [name] is an 62 y.o. year old male who is a primary patient of Patrick Herter, NP.  The [facility] Managed Care Coordination team was consulted for assistance with:    DMII Prostate Cancer  Patrick [name] was given information about Medicaid Managed Care Coordination team services today. Patrick [name] Patient agreed to services and verbal consent obtained.  Engaged with patient by telephone for follow up visit in response to provider referral for case management and/or care coordination services.   Assessments/Interventions:  Review of past medical history, allergies, medications, health status, including review of consultants reports, laboratory and other test data, was performed as part of comprehensive evaluation and provision of chronic care management services.  SDOH (Social Determinants of Health) assessments and interventions performed: SDOH Interventions    Flowsheet Row Patient Outreach Telephone from 01/12/2023 in [place] Patient Outreach Telephone from 12/05/2022 in [place] Patient Outreach Telephone from 10/03/2022 in [place] Patient Outreach Telephone from 08/18/2022 in [place] Coordination Patient Outreach Telephone from 07/18/2022 in [place] Patient Outreach Telephone from 06/16/2022 in [place] Interventions        Food Insecurity Interventions Intervention Not Indicated -- -- -- -- --  Housing Interventions Intervention Not Indicated -- Intervention Not Indicated -- -- Intervention Not Indicated  Transportation Interventions -- Other (Comment)  [Provided with [employer] [phone], provided by [facility]] -- Intervention Not Indicated --  Intervention Not Indicated  Utilities Interventions -- -- Intervention Not Indicated -- -- --  Physical Activity Interventions -- -- -- -- Intervention Not Indicated --       Care Plan  No Known Allergies  Medications Reviewed Today     Reviewed by Melissa Montane, RN (Registered Nurse) on 01/12/23 at [place] List Status: <None>   Medication Order Taking? Sig Documenting Provider Last Dose Status Informant  B-D UF III MINI PEN NEEDLES 31G X 5 MM MISC [id number] Yes USE 3 TIMES DAILY [name], MD Taking Active   Blood Pressure Monitor DEVI [id number] Yes Use as directed to check home blood pressure at least 4 times a week. Please notify primary provider of blood pressures greater than 140/90. Please send prescription to [facility].  Phone Number [phone] Fax Number [phone] Patrick Herter, NP Taking Active   Cholecalciferol (VITAMIN D-1000 MAX ST) 25 MCG (1000 UT) tablet [id number] Yes Take 1,000 Units by mouth daily. [provider] Taking Active   Continuous Blood Gluc Receiver ([place]) [place] [id number] Yes Use to check blood sugar daily [name], MD Taking Active   Continuous Blood Gluc Sensor ([place]) [id number] Yes Change every 10 days [name], MD Taking Active   cyanocobalamin 100 MCG tablet [id number] Yes Take 1 tablet by mouth daily. [provider] Taking Active   cycloSPORINE (RESTASIS) 0.05 % ophthalmic emulsion [id number] Yes Place 1 drop into both eyes 2 (two) times daily. Patrick Herter, NP Taking Active Self  gabapentin (NEURONTIN) 300 MG capsule [id number] Yes TAKE 1 CAPSULE BY MOUTH EVERYDAY AT BEDTIME Patrick Herter, NP Taking Active   glucose blood (ACCU-CHEK AVIVA PLUS) test strip [id number] Yes 1 EACH BY OTHER ROUTE 4 (FOUR) TIMES DAILY [name], MD Taking Active  insulin aspart (NOVOLOG FLEXPEN) 100 UNIT/ML FlexPen [id number] Yes 4 times a day (just before each meal): 6-6-4-2 units Renato Shin, MD  Taking Active   insulin glargine (LANTUS SOLOSTAR) 100 UNIT/ML Solostar Pen [id number] Yes Inject 8 Units into the skin at bedtime. Renato Shin, MD Taking Active   latanoprost [name]) 0.005 % ophthalmic solution [id number] Yes SMARTSIG:1 Drop(s) In Eye(s) Every Evening [provider] Taking Active   losartan-hydrochlorothiazide (HYZAAR) 100-25 MG tablet [id number] Yes Take 1 tablet by mouth daily. Patrick Herter, NP Taking Active   metFORMIN (GLUCOPHAGE) 500 MG tablet [id number] No Take 1 tablet (500 mg total) by mouth 2 (two) times daily with a meal.  Patient not taking: Reported on 01/12/2023   [name], MD Not Taking Active   Multiple Vitamin (MULTIVITAMIN WITH MINERALS) TABS tablet [id number] Yes Take 1 tablet by mouth daily. [provider] Taking Active Self  Multiple Vitamin (MULTIVITAMIN) capsule [id number] No Take 1 capsule by mouth daily.  Patient not taking: Reported on 01/12/2023   [provider] Not Taking Active   sildenafil (VIAGRA) 25 MG tablet [id number] No Take 1 tablet 1/2 hour to 1 hour prior to intercourse as needed. Limit use to 1/2 tablet or 1 tablet per 24 hours.  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) 03/14/2014   Colon cancer screening 03/14/2014   Hypotension, unspecified 03/01/2014   DKA (diabetic ketoacidoses) 02/28/2014    Hyponatremia 02/28/2014   Hepatic encephalopathy ([place]) 08/24/2012   Anemia 08/23/2012   Malnutrition ([place]) 08/23/2012   Acute renal failure ([place]) 08/23/2012   Thrombocytopenia ([place]) 08/23/2012   Coagulopathy ([place]) 08/23/2012   Ascites 0000000   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    Conditions to be addressed/monitored per PCP order:  DMII and Prostate Cancer  Care Plan : [facility] Plan of Care  Updates made by Melissa Montane, RN since 01/12/2023 12:00 AM     Problem: Chronic Disease Management & Care Coordination Needs for DMI & HTN      Long-Range Goal: Development of Plan of Care for Chronic Disease Management & Care Coordination Needs   Start Date: 07/14/2021  Expected End Date: 02/17/2023  Priority: High  Note:   Current Barriers:  Knowledge Deficits related to plan of care for management of  DM I and Prostate Cancer Chronic Disease Management support and education needs related to  DM I and Prostate Cancer  Mr. Mapson is scheduled for Prostatectomy on 01/20/23, he attended preop on 01/11/23. He is working to better manage his diabetes. Attempted to incorporate Metformin in the evening to help with overnight BS elevations. He did not tolerate metformin, has not notified Dr. Dwyane Dee. He is having difficulty getting refill on generic Restasis prescribed by Dr. Katy Fitch.  RNCM Clinical Goal(s):  Patient  will verbalize understanding of plan for management of HTN and DM I and Prostate Issues as evidenced by good management of these chronic diseases verbalize basic understanding of HTN and DM I and Prostate Issues disease process and self health management plan as evidenced by good management of these chronic diseases take all medications exactly as prescribed and will call provider for medication related questions as evidenced by patient being medication compliant.    attend all scheduled medical appointments: 11/07/22 for Preop Assessment and 11/23/22 for Prostate Biopsy as  evidenced by Provider documentation in EMR        demonstrate ongoing adherence to prescribed treatment plan for HTN and DM I and Prostate Issues as evidenced by noted improvement of chronic disease managment of these diseases. demonstrate ongoing health management independence as evidenced by good maintenance of HTN, DM I  and Prostate Issues.        continue to work with Consulting civil engineer and/or Social Worker to address care management and care coordination needs related to HTN and DM I and Prostate Issues as evidenced by adherence to CM Team Scheduled appointments     work with [employer] care guide to address needs related to [employer] knowledge of community resource: Corporate investment banker and Applying for Disability as evidenced by patient and/or community resource care guide support    through collaboration with Consulting civil engineer, provider, and care team.   Interventions: Inter-disciplinary care team collaboration (see longitudinal plan of care) Evaluation of current treatment plan related to  self management and patient's adherence to plan as established by provider Advised patient to contact Dr. Katy Fitch for new prescription for Restasis-name brand is on the [facility] list as a preferred medication Advised patient to contact Social Security office for financial questions   Diabetes:  (Status: Goal on Track (progressing): YES.) Lab Results  Component Value Date   HGBA1C 7.7 (H) 09/02/2022    Lab Results  Component Value Date   HGBA1C 7.7 (H) 09/02/2022    Assessed patient's understanding of A1c goal: <7% Provided education to patient about basic DM disease process; Reviewed medications with patient and discussed importance of medication adherence;        Counseled on importance of regular laboratory monitoring as prescribed;        Discussed plans with patient for ongoing care management follow up and provided patient with direct contact information for care management team;            Reviewed scheduled/upcoming provider appointments including: 01/20/23 for Prostatectomy at [facility], 03/20/23 for labs, 03/23/23 with Dr. Dwyane Dee and 03/27/23 with PCP Discussed food choices, encouraged patient to include protein with night time snack, specifically [place] Yogurt with no sugar, example provided Advised patient to exercise 30 minutes a day   Advised patient to notify Dr. Dwyane Dee via MyChart messaging regarding metformin intolerance           Prostate Cancer:  (Status:  Goal on track:  Yes.)  Long Term Goal Evaluation of current treatment plan related to  Prostate Issues with elevated PSA level of 12.18 one month ago. , Lacks knowledge of community resource: dental resources for broken dentures and information regarding applying for disability,  self-management and patient's adherence to plan as established by provider. Discussed plans with patient for ongoing care management follow up and provided patient with direct contact information for care management team Evaluation of current treatment plan related to elevated Prostate Cancer and patient's adherence to plan as established by provider Assessed social  determinant of health barriers  Advised patient to contact Provider with concerns or questions Reviewed surgery schedule Verified patient has transportation and help at home post surgery    Patient Goals/Self-Care Activities: Patient will self administer medications as prescribed Patient will attend all scheduled provider appointments Patient will call pharmacy for medication refills Patient will continue to perform ADL's independently Patient will continue to perform IADL's independently Patient will call provider office for new concerns or questions       Follow Up:  Patient agrees to Care Plan and Follow-up.  Plan: The Managed Medicaid care management team will reach out to the patient again over the next 15 days.  Date/time of next scheduled RN care management/care coordination  outreach:  01/25/23 @ 3:30pm  Lurena Joiner RN, BSN [place] RN Care Coordinator

## 2023-01-20 HISTORY — PX: PROSTATECTOMY: SHX69

## 2023-01-25 ENCOUNTER — Encounter: Payer: Self-pay | Admitting: *Deleted

## 2023-01-25 ENCOUNTER — Other Ambulatory Visit: Payer: Medicaid Other | Admitting: *Deleted

## 2023-01-25 NOTE — Transitions of Care (Post Inpatient/ED Visit) (Signed)
   01/25/2023  Name: Patrick [name] MRN: [id number] DOB: 05-04-1961  Today's TOC FU Call Status: Today's TOC FU Call Status:: Successful TOC FU Call Competed TOC FU Call Complete Date: 01/25/23  Transition Care Management Follow-up Telephone Call Date of Discharge: 01/21/23 Discharge Facility: Other ([place]) Name of Other ([place]) Discharge Facility: [facility] Type of Discharge: Inpatient Admission Primary Inpatient Discharge Diagnosis:: Prostatectomy How have you been since you were released from the hospital?: Better Any questions or concerns?: No  Items Reviewed: Did you receive and understand the discharge instructions provided?: Yes Medications obtained and verified?: Yes (Medications Reviewed) Any new allergies since your discharge?: No Dietary orders reviewed?: NA Do you have support at home?: Yes People in Home: significant other  Home Care and Equipment/Supplies: [place] Ordered?: No Any new equipment or medical supplies ordered?: No  Functional Questionnaire: Do you need assistance with bathing/showering or dressing?: No Do you need assistance with meal preparation?: No Do you need assistance with eating?: No Do you have difficulty maintaining continence: No Do you need assistance with getting out of bed/getting out of a chair/moving?: No Do you have difficulty managing or taking your medications?: No  Folllow up appointments reviewed: PCP Follow-up appointment confirmed?: NA Specialist Hospital Follow-up appointment confirmed?: Yes Date of Specialist follow-up appointment?: 01/31/23 Follow-Up Specialty Provider:: Surgeon Do you need transportation to your follow-up appointment?: No Do you understand care options if your condition(s) worsen?: Yes-patient verbalized understanding    Lurena Joiner RN, BSN [place] RN Care Coordinator

## 2023-02-28 ENCOUNTER — Encounter: Payer: Self-pay | Admitting: *Deleted

## 2023-02-28 ENCOUNTER — Other Ambulatory Visit: Payer: Medicaid Other | Admitting: *Deleted

## 2023-02-28 NOTE — Patient Outreach (Signed)
Medicaid Managed Care   Nurse Care Manager Note  02/28/2023 Name:  Patrick Patrick [name] MRN:  [id number] DOB:  05/15/1961  Patrick Patrick [name] is an 62 y.o. year old male who is Patrick [name] primary patient of Patrick Fendt, NP.  The [facility] Managed Care Coordination team was consulted for assistance with:    DMII  Patrick Patrick [name] was given information about Medicaid Managed Care Coordination team services today. Patrick Patrick [name] Patient agreed to services and verbal consent obtained.  Engaged with patient by telephone for follow up visit in response to provider referral for case management and/or care coordination services.   Assessments/Interventions:  Review of past medical history, allergies, medications, health status, including review of consultants reports, laboratory and other test data, was performed as part of comprehensive evaluation and provision of chronic care management services.  SDOH (Social Determinants of Health) assessments and interventions performed: SDOH Interventions    Flowsheet Row Patient Outreach Telephone from 02/28/2023 in [place] POPULATION HEALTH DEPARTMENT Patient Outreach Telephone from 01/12/2023 in [place] POPULATION HEALTH DEPARTMENT Patient Outreach Telephone from 12/05/2022 in [place] POPULATION HEALTH DEPARTMENT Patient Outreach Telephone from 10/03/2022 in [place] POPULATION HEALTH DEPARTMENT Patient Outreach Telephone from 08/18/2022 in [facility] Community Care Coordination Patient Outreach Telephone from 07/18/2022 in [facility] Community Care Coordination  SDOH Interventions        Food Insecurity Interventions -- Intervention Not Indicated -- -- -- --  Housing Interventions -- Intervention Not Indicated -- Intervention Not Indicated -- --  Transportation Interventions Intervention Not Indicated -- Other (Comment)  [Provided with [employer] [phone], provided by [facility]] -- Intervention Not Indicated --  Utilities Interventions  Intervention Not Indicated -- -- Intervention Not Indicated -- --  Physical Activity Interventions -- -- -- -- -- Intervention Not Indicated       Care Plan  No Known Allergies  Medications Reviewed Today     Reviewed by Patrick Patrick [name], Patrick Patrick [name] (Registered Nurse) on 02/28/23 at 1058  Med List Status: <None>   Medication Order Taking? Sig Documenting Provider Last Dose Status Informant  acetaminophen (TYLENOL) 500 MG tablet [id number] Yes Take 500 mg by mouth every 6 (six) hours as needed. [provider] Taking Active   B-D UF III MINI PEN NEEDLES 31G X 5 MM MISC [id number] Yes USE 3 TIMES DAILY Patrick Littler, MD Taking Active   Blood Pressure Monitor DEVI [id number] Yes Use as directed to check home blood pressure at least 4 times Patrick [name] week. Please notify primary provider of blood pressures greater than 140/90. Please send prescription to [facility].  Phone Number [phone] Fax Number [phone] Patrick Fendt, NP Taking Active   Cholecalciferol (VITAMIN D-1000 MAX ST) 25 MCG (1000 UT) tablet [id number] Yes Take 1,000 Units by mouth daily. [provider] Taking Active   Continuous Blood Gluc Receiver Patrick Patrick [name] [place]) DEVI [id number] Yes Use to check blood sugar daily Patrick Littler, MD Taking Active   Continuous Blood Gluc Sensor (DEXCOM [name] SENSOR) MISC [id number] Yes Change every 10 days Patrick Littler, MD Taking Active   cyanocobalamin 100 MCG tablet [id number] Yes Take 1 tablet by mouth daily. [provider] Taking Active   cycloSPORINE (RESTASIS) 0.05 % ophthalmic emulsion [id number] Yes Place 1 drop into both eyes 2 (two) times daily. Patrick Fendt, NP Taking Active Self  Docusate Calcium (STOOL SOFTENER PO) [id number] Yes Take by mouth. [provider] Taking Active   gabapentin (NEURONTIN) 300 MG capsule [id number] Yes TAKE  1 CAPSULE BY MOUTH EVERYDAY AT BEDTIME Patrick Patrick [name], Patrick J, NP Taking Active   glucose blood (ACCU-CHEK AVIVA PLUS) test strip  [id number] Yes 1 EACH BY OTHER ROUTE 4 (FOUR) TIMES DAILY Patrick LittlerKumar, Ajay, MD Taking Active   insulin aspart (NOVOLOG FLEXPEN) 100 UNIT/ML FlexPen [id number] Yes 4 times Patrick [name] day (just before each meal): 6-6-4-2 units Patrick Patrick [name], Sean, MD Taking Active   insulin glargine (LANTUS SOLOSTAR) 100 UNIT/ML Solostar Pen [id number] Yes Inject 8 Units into the skin at bedtime. Patrick Patrick [name], Sean, MD Taking Active   latanoprost Patrick Patrick [name](XALATAN) 0.005 % ophthalmic solution [id number] Yes SMARTSIG:1 Drop(s) In Eye(s) Every Evening [provider] Taking Active   losartan-hydrochlorothiazide (HYZAAR) 100-25 MG tablet [id number] Yes Take 1 tablet by mouth daily. Patrick Patrick [name], Patrick J, NP Taking Active   metFORMIN (GLUCOPHAGE) 500 MG tablet [id number] No Take 1 tablet (500 mg total) by mouth 2 (two) times daily with Patrick [name] meal.  Patient not taking: Reported on 01/25/2023   Patrick LittlerKumar, Ajay, MD Not Taking Active   Multiple Vitamin (MULTIVITAMIN WITH MINERALS) TABS tablet [id number] Yes Take 1 tablet by mouth daily. [provider] Taking Active Self  Multiple Vitamin (MULTIVITAMIN) capsule [id number] No Take 1 capsule by mouth daily.  Patient not taking: Reported on 01/12/2023   [provider] Not Taking Active   sildenafil (VIAGRA) 25 MG tablet [id number] Yes Take 1 tablet 1/2 hour to 1 hour prior to intercourse as needed. Limit use to 1/2 tablet or 1 tablet per 24 hours. Patrick Patrick [name], Patrick J, NP Taking Active            Med Note (Patrick Patrick [name] Patrick [name]   Thu Jun 16, 2022 10:50 AM) Patient needs updated prescription  simvastatin (ZOCOR) 20 MG tablet [id number] Yes TAKE 1 TABLET BY MOUTH EVERYDAY AT BEDTIME Patrick LittlerKumar, Ajay, MD Taking Active             Patient Active Problem List   Diagnosis Date Noted   Family history of malignant neoplasm of prostate 08/09/2022   Elevated prostate specific antigen (PSA) 01/18/2022   Generalized anxiety disorder 05/05/2021   Essential hypertension 04/21/2021   Type 1 diabetes mellitus with polyneuropathy  03/22/2014   Cirrhosis of liver 03/14/2014   Colon cancer screening 03/14/2014   Hypotension, unspecified 03/01/2014   DKA (diabetic ketoacidoses) 02/28/2014   Hyponatremia 02/28/2014   Hepatic encephalopathy 08/24/2012   Anemia 08/23/2012   Malnutrition 08/23/2012   Acute renal failure 08/23/2012   Thrombocytopenia 08/23/2012   Coagulopathy 08/23/2012   Ascites 08/22/2012   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    Conditions to be addressed/monitored per PCP order:  DMII  [facility]  Updates made by Patrick Dachobb, Patrick Dunlap Patrick [name], Patrick Patrick [name] since 02/28/2023 12:00 AM     Problem: Chronic Disease Management & Care Coordination Needs for DMI & HTN      Long-Range Goal: Development of Plan of Care for Chronic Disease Management & Care Coordination Needs   Start Date: 07/14/2021  Expected End Date: 03/31/2023  Priority: High  Note:   Current Barriers:  Knowledge Deficits related to plan of care for management of  DM I and Prostate Cancer Chronic Disease Management support and education needs related to  DM I and Prostate Cancer  Patrick Patrick [name] is recovering after recent Prostatectomy. He attended post op visit on 01/31/23 and has Urology on 03/02/23. He continues to work on diabetic management, reporting fasting BS 120. Follow up with Endocrinology on 03/23/23/  RNCM Clinical Goal(s):  Patient will  verbalize understanding of plan for management of HTN and DM I and Prostate Issues as evidenced by good management of these chronic diseases verbalize basic understanding of HTN and DM I and Prostate Issues disease process and self health management plan as evidenced by good management of these chronic diseases take all medications exactly as prescribed and will call provider for medication related questions as evidenced by patient being medication compliant.    attend all scheduled medical appointments: 03/02/23 for Urology, 03/20/23 for labs, 03/23/23 with Dr. Lucianne Muss and 03/27/23 with PCP as  evidenced by Provider documentation in EMR        demonstrate ongoing adherence to prescribed treatment plan for HTN and DM I and Prostate Issues as evidenced by noted improvement of chronic disease managment of these diseases. demonstrate ongoing health management independence as evidenced by good maintenance of HTN, DM I  and Prostate Issues.        continue to work with Medical illustrator and/or Social Worker to address care management and care coordination needs related to HTN and DM I and Prostate Issues as evidenced by adherence to CM Team Scheduled appointments     work with [employer] care guide to address needs related to [employer] knowledge of community resource: Radiographer, therapeutic and Applying for Disability as evidenced by patient and/or community resource care guide support    through collaboration with Medical illustrator, provider, and care team.   Interventions: Inter-disciplinary care team collaboration (see longitudinal plan of care) Evaluation of current treatment plan related to  self management and patient's adherence to plan as established by provider   Diabetes:  (Status: Goal on Track (progressing): YES.) Lab Results  Component Value Date   HGBA1C 7.8 (Patrick [name]) 01/10/2023    Lab Results  Component Value Date   HGBA1C 7.8 (Patrick [name]) 01/10/2023    Assessed patient's understanding of A1c goal: <7% Provided education to patient about basic DM disease process; Reviewed medications with patient and discussed importance of medication adherence;        Counseled on importance of regular laboratory monitoring as prescribed;        Discussed plans with patient for ongoing care management follow up and provided patient with direct contact information for care management team;           Reviewed scheduled/upcoming provider appointments including: 03/02/23 for Urology, 03/20/23 for labs, 03/23/23 with Dr. Lucianne Muss and 03/27/23 with PCP Advised patient to exercise 30 minutes Patrick [name] day  Advised patient discuss  foot exam with provider    Prostate Cancer:  (Status:  Goal Met.)  Long Term Goal-Patient had post operative follow up on 01/31/23 Evaluation of current treatment plan related to  Prostate Issues with elevated PSA level of 12.18 one month ago. , Lacks knowledge of community resource: dental resources for broken dentures and information regarding applying for disability,  self-management and patient's adherence to plan as established by provider. Discussed plans with patient for ongoing care management follow up and provided patient with direct contact information for care management team Evaluation of current treatment plan related to elevated Prostate Cancer and patient's adherence to plan as established by provider Assessed social determinant of health barriers  Advised patient to contact Provider with concerns or questions Verified patient has transportation and help at home post surgery    Patient Goals/Self-Care Activities: Patient will self administer medications as prescribed Patient will attend all scheduled provider appointments Patient will call pharmacy for medication refills Patient will continue to perform ADL's independently  Patient will continue to perform IADL's independently Patient will call provider office for new concerns or questions       Follow Up:  Patient agrees to Care Plan and Follow-up.  Plan: The Managed Medicaid care management team will reach out to the patient again over the next 30 days.  Date/time of next scheduled Patrick Patrick [name] care management/care coordination outreach:  03/31/23 @ 10:30am  [name] Patrick Patrick [name], BSN [place]  Managed [facility] Patrick Patrick [name] Care Coordinator [phone]

## 2023-02-28 NOTE — Patient Instructions (Signed)
Visit Information  Patrick [name] was given information about Medicaid Managed Care team care coordination services as a part of their [facility] Medicaid benefit. [name] verbally consented to engagement with the [facility] Managed Care team.   If you are experiencing a medical emergency, please call 911 or report to your local emergency department or urgent care.   If you have a non-emergency medical problem during routine business hours, please contact your provider's office and ask to speak with a nurse.   For questions related to your [facility] health plan, please call: [phone] or go here:[email or web address]  If you would like to schedule transportation through your [facility] plan, please call the following number at least 2 days in advance of your appointment: [phone].  You can also use the MTM portal or MTM mobile app to manage your rides. For the portal, please go to [email or web address]  Call the [facility] Crisis Line at [phone], at any time, 24 hours a day, 7 days a week. If you are in danger or need immediate medical attention call 911.  If you would like help to quit smoking, call 1-800-QUIT-NOW ((212) 518-0404) OR Espaol: 1-855-Djelo-Ya (2-641-583-0940) o para ms informacin haga clic aqu or Text READY to 768-088 to register via text  Mr. Bagnato,   Please see education materials related to foot care provided by MyChart link.  Patient verbalizes understanding of instructions and care plan provided today and agrees to view in MyChart. Active MyChart status and patient understanding of how to access instructions and care plan via MyChart confirmed with patient.     Telephone follow up appointment with Managed Medicaid care management team member scheduled for:03/31/23 @ 10:30am  [name] RN, BSN [place]  Managed [facility] RN Care Coordinator [phone]   Following is a copy of your plan of care:  Care Plan : [facility] Plan  of Care  Updates made by Heidi Dach, RN since 02/28/2023 12:00 AM     Problem: Chronic Disease Management & Care Coordination Needs for DMI & HTN      Long-Range Goal: Development of Plan of Care for Chronic Disease Management & Care Coordination Needs   Start Date: 07/14/2021  Expected End Date: 03/31/2023  Priority: High  Note:   Current Barriers:  Knowledge Deficits related to plan of care for management of  DM I and Prostate Cancer Chronic Disease Management support and education needs related to  DM I and Prostate Cancer  Patrick [name] is recovering after recent Prostatectomy. He attended post op visit on 01/31/23 and has Urology on 03/02/23. He continues to work on diabetic management, reporting fasting BS 120. Follow up with Endocrinology on 03/23/23/  RNCM Clinical Goal(s):  Patient will verbalize understanding of plan for management of HTN and DM I and Prostate Issues as evidenced by good management of these chronic diseases verbalize basic understanding of HTN and DM I and Prostate Issues disease process and self health management plan as evidenced by good management of these chronic diseases take all medications exactly as prescribed and will call provider for medication related questions as evidenced by patient being medication compliant.    attend all scheduled medical appointments: 03/02/23 for Urology, 03/20/23 for labs, 03/23/23 with Dr. Lucianne Muss and 03/27/23 with PCP as evidenced by Provider documentation in EMR        demonstrate ongoing adherence to prescribed treatment plan for HTN and DM I and Prostate Issues as evidenced by noted improvement of chronic disease managment of these diseases. demonstrate  ongoing health management independence as evidenced by good maintenance of HTN, DM I  and Prostate Issues.        continue to work with Medical illustrator and/or Social Worker to address care management and care coordination needs related to HTN and DM I and Prostate Issues as evidenced by  adherence to CM Team Scheduled appointments     work with [employer] care guide to address needs related to [employer] knowledge of community resource: Radiographer, therapeutic and Applying for Disability as evidenced by patient and/or community resource care guide support    through collaboration with Medical illustrator, provider, and care team.   Interventions: Inter-disciplinary care team collaboration (see longitudinal plan of care) Evaluation of current treatment plan related to  self management and patient's adherence to plan as established by provider   Diabetes:  (Status: Goal on Track (progressing): YES.) Lab Results  Component Value Date   HGBA1C 7.8 (A) 01/10/2023    Lab Results  Component Value Date   HGBA1C 7.8 (A) 01/10/2023    Assessed patient's understanding of A1c goal: <7% Provided education to patient about basic DM disease process; Reviewed medications with patient and discussed importance of medication adherence;        Counseled on importance of regular laboratory monitoring as prescribed;        Discussed plans with patient for ongoing care management follow up and provided patient with direct contact information for care management team;           Reviewed scheduled/upcoming provider appointments including: 03/02/23 for Urology, 03/20/23 for labs, 03/23/23 with Dr. Lucianne Muss and 03/27/23 with PCP Advised patient to exercise 30 minutes a day  Advised patient discuss foot exam with provider    Prostate Cancer:  (Status:  Goal Met.)  Long Term Goal-Patient had post operative follow up on 01/31/23 Evaluation of current treatment plan related to  Prostate Issues with elevated PSA level of 12.18 one month ago. , Lacks knowledge of community resource: dental resources for broken dentures and information regarding applying for disability,  self-management and patient's adherence to plan as established by provider. Discussed plans with patient for ongoing care management follow up and  provided patient with direct contact information for care management team Evaluation of current treatment plan related to elevated Prostate Cancer and patient's adherence to plan as established by provider Assessed social determinant of health barriers  Advised patient to contact Provider with concerns or questions Verified patient has transportation and help at home post surgery    Patient Goals/Self-Care Activities: Patient will self administer medications as prescribed Patient will attend all scheduled provider appointments Patient will call pharmacy for medication refills Patient will continue to perform ADL's independently Patient will continue to perform IADL's independently Patient will call provider office for new concerns or questions

## 2023-03-08 ENCOUNTER — Telehealth: Payer: Self-pay

## 2023-03-08 NOTE — Telephone Encounter (Signed)
Pt called to request Dexcom order be sent to [facility]. Ordered via [employer].

## 2023-03-10 NOTE — Telephone Encounter (Signed)
Patient came into office to request Dexcom G7 sensor to bridge the gap until his order came in.

## 2023-03-20 ENCOUNTER — Other Ambulatory Visit (INDEPENDENT_AMBULATORY_CARE_PROVIDER_SITE_OTHER): Payer: Medicaid Other

## 2023-03-20 DIAGNOSIS — E1065 Type 1 diabetes mellitus with hyperglycemia: Secondary | ICD-10-CM | POA: Diagnosis not present

## 2023-03-20 LAB — BASIC METABOLIC PANEL
BUN: 26 mg/dL — ABNORMAL HIGH (ref 6–23)
CO2: 30 mEq/L (ref 19–32)
Calcium: 9.7 mg/dL (ref 8.4–10.5)
Chloride: 102 mEq/L (ref 96–112)
Creatinine, Ser: 1.44 mg/dL (ref 0.40–1.50)
GFR: 52.7 mL/min — ABNORMAL LOW (ref >60.00)
Glucose, Bld: 87 mg/dL (ref 70–99)
Potassium: 4.1 mEq/L (ref 3.5–5.1)
Sodium: 141 mEq/L (ref 135–145)

## 2023-03-20 LAB — HEMOGLOBIN A1C: Hgb A1c MFr Bld: 7.7 % — ABNORMAL HIGH (ref 4.6–6.5)

## 2023-03-22 NOTE — Progress Notes (Signed)
Patient ID: CAPRI SADLIER, male    DOB: 1961-07-28  MRN: [id number]  CC: Chronic Care Management   Subjective: Patrick [name] is a 62 y.o. male who presents for chronic care management.   His concerns today include:  Doing well on blood pressure medication, no issues/concerns. He denies red flag symptoms such as but not limited to chest pain, shortness of breath, worst headache of life, nausea/vomiting. No further issues/concerns for discussion today.   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) 03/22/2014   Cirrhosis of liver (HCC) 03/14/2014   Colon cancer screening 03/14/2014   Hypotension, unspecified 03/01/2014   DKA (diabetic ketoacidoses) 02/28/2014   Hyponatremia 02/28/2014   Hepatic encephalopathy (HCC) 08/24/2012   Anemia 08/23/2012   Malnutrition (HCC) 08/23/2012   Acute renal failure (HCC) 08/23/2012   Thrombocytopenia (HCC) 08/23/2012   Coagulopathy (HCC) 08/23/2012   Ascites 08/22/2012   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     Current Outpatient Medications on File Prior to Visit  Medication Sig Dispense Refill   acetaminophen (TYLENOL) 500 MG tablet Take 500 mg by mouth every 6 (six) hours as needed.     B-D UF III MINI PEN NEEDLES 31G X 5 MM MISC USE 3 TIMES DAILY 300 each 1   Blood Pressure Monitor DEVI Use as directed to check home blood pressure at least 4 times a week. Please notify primary provider of blood pressures greater than 140/90. Please send prescription to [facility].  Phone Number [phone] Fax Number [phone] each [phone]   Cholecalciferol (VITAMIN D-1000 MAX ST) 25 MCG (1000 UT) tablet Take 1,000 Units by mouth  daily.     Continuous Blood Gluc Receiver (DEXCOM G7 RECEIVER) DEVI Use to check blood sugar daily 1 each 0   Continuous Blood Gluc Sensor (DEXCOM G7 SENSOR) MISC Change every 10 days 9 each 2   cyanocobalamin 100 MCG tablet Take 1 tablet by mouth daily.     cycloSPORINE (RESTASIS) 0.05 % ophthalmic emulsion Place 1 drop into both eyes 2 (two) times daily.     Docusate Calcium (STOOL SOFTENER PO) Take by mouth.     gabapentin (NEURONTIN) 300 MG capsule TAKE 1 CAPSULE BY MOUTH EVERYDAY AT BEDTIME 90 capsule 3   glucose blood (ACCU-CHEK AVIVA PLUS) test strip 1 EACH BY OTHER ROUTE 4 (FOUR) TIMES DAILY 100 strip 13   insulin aspart (NOVOLOG FLEXPEN) 100 UNIT/ML FlexPen 4 times a day (just before each meal): 6-6-4-2 units 30 mL 3   insulin glargine (LANTUS SOLOSTAR) 100 UNIT/ML Solostar Pen Inject 8 Units into the skin at bedtime. 15 mL PRN   latanoprost (XALATAN) 0.005 % ophthalmic solution SMARTSIG:1 Drop(s) In Eye(s) Every Evening     metFORMIN (GLUCOPHAGE) 500 MG tablet Take 1 tablet (500 mg total) by mouth 2 (two) times daily with a meal. (Patient not taking: Reported on 03/23/2023) 180 tablet 3   Multiple Vitamin (MULTIVITAMIN WITH MINERALS) TABS tablet Take 1 tablet by mouth daily.     Multiple Vitamin (MULTIVITAMIN) capsule Take 1 capsule by mouth daily.     simvastatin (ZOCOR) 20 MG tablet TAKE 1 TABLET BY MOUTH EVERYDAY AT BEDTIME 90 tablet 3   tadalafil (CIALIS) 5 MG  tablet Take 5 mg by mouth daily.     No current facility-administered medications on file prior to visit.    No Known Allergies  Social History   Socioeconomic History   Marital status: Single    Spouse name: Not on file   Number of children: 1   Years of education: Not on file   Highest education level: Associate degree: occupational, Scientist, product/process development, or vocational program  Occupational History   Occupation: unemployed  Tobacco Use   Smoking status: Former    Packs/day: 0.00    Years: 15.00    Additional pack years:  0.00    Total pack years: 0.00    Types: Cigarettes    Quit date: 07/14/2010    Years since quitting: 12.7    Passive exposure: Past   Smokeless tobacco: Never  Substance and Sexual Activity   Alcohol use: No    Comment: Not using for last 11 years   Drug use: No   Sexual activity: Not on file  Other Topics Concern   Not on file  Social History Narrative   Not on file   Social Determinants of Health   Financial Resource Strain: Low Risk  (07/14/2021)   Overall Financial Resource Strain (CARDIA)    Difficulty of Paying Living Expenses: Not very hard  Food Insecurity: No Food Insecurity (03/23/2023)   Hunger Vital Sign    Worried About Running Out of Food in the Last Year: Never true    Ran Out of Food in the Last Year: Never true  Transportation Needs: No Transportation Needs (03/23/2023)   PRAPARE - Administrator, Civil Service (Medical): No    Lack of Transportation (Non-Medical): No  Physical Activity: Sufficiently Active (03/23/2023)   Exercise Vital Sign    Days of Exercise per Week: 5 days    Minutes of Exercise per Session: 30 min  Stress: No Stress Concern Present (03/23/2023)   [employer] of Occupational Health - Occupational Stress Questionnaire    Feeling of Stress : Not at all  Social Connections: Unknown (03/23/2023)   Social Connection and Isolation Panel [NHANES]    Frequency of Communication with Friends and Family: More than three times a week    Frequency of Social Gatherings with Friends and Family: More than three times a week    Attends Religious Services: More than 4 times per year    Active Member of [employer] or Organizations: Not on file    Attends Banker Meetings: Not on file    Marital Status: Living with partner  Intimate Partner Violence: Not on file    Family History  Problem Relation Age of Onset   Heart disease Mother        bi-pass   Hypertension Mother    Cancer Brother        unsure type   Hypertension Father     Colon cancer Neg Hx     Past Surgical History:  Procedure Laterality Date   EYE SURGERY N/A    Phreesia 02/21/2021   MOUTH SURGERY     on gums   TONSILLECTOMY      ROS: Review of Systems Negative except as stated above  PHYSICAL EXAM: BP 136/72 (BP Location: Right Arm, Patient Position: Sitting, Cuff Size: Normal)   Pulse 65   Temp 98 F (36.7 C)   Resp 14   Ht 5\' 10"  (1.778 m)   Wt 178 lb (80.7 kg)   SpO2 97%   BMI  25.54 kg/m   Physical Exam HENT:     Head: Normocephalic and atraumatic.  Eyes:     Extraocular Movements: Extraocular movements intact.     Conjunctiva/sclera: Conjunctivae normal.     Pupils: Pupils are equal, round, and reactive to light.  Cardiovascular:     Rate and Rhythm: Normal rate and regular rhythm.     Pulses: Normal pulses.     Heart sounds: Normal heart sounds.  Pulmonary:     Effort: Pulmonary effort is normal.     Breath sounds: Normal breath sounds.  Musculoskeletal:     Cervical back: Normal range of motion and neck supple.  Neurological:     General: No focal deficit present.     Mental Status: He is alert and oriented to person, place, and time.  Psychiatric:        Mood and Affect: Mood normal.        Behavior: Behavior normal.     ASSESSMENT AND PLAN: 1. Primary hypertension - Continue Losartan-Hydrochlorothiazide as prescribed. - Counseled on blood pressure goal of less than 130/80, low-sodium, DASH diet, medication compliance, and 150 minutes of moderate intensity exercise per week as tolerated. Counseled on medication adherence and adverse effects. - Follow-up with primary provider in 6 months or sooner if needed.  - losartan-hydrochlorothiazide (HYZAAR) 100-25 MG tablet; Take 1 tablet by mouth daily.  Dispense: 90 tablet; Refill: 0   Patient was given the opportunity to ask questions.  Patient verbalized understanding of the plan and was able to repeat key elements of the plan. Patient was given clear instructions  to go to Emergency Department or return to medical center if symptoms don't improve, worsen, or new problems develop.The patient verbalized understanding.    Requested Prescriptions   Signed Prescriptions Disp Refills   losartan-hydrochlorothiazide (HYZAAR) 100-25 MG tablet 90 tablet 0    Sig: Take 1 tablet by mouth daily.    Return in about 6 months (around 09/27/2023) for Follow-Up or next available chronic care mgmt .  [name], NP

## 2023-03-23 ENCOUNTER — Ambulatory Visit (INDEPENDENT_AMBULATORY_CARE_PROVIDER_SITE_OTHER): Payer: Medicare Other | Admitting: Endocrinology

## 2023-03-23 VITALS — BP 128/72 | HR 87 | Ht 70.0 in | Wt 176.0 lb

## 2023-03-23 DIAGNOSIS — E1065 Type 1 diabetes mellitus with hyperglycemia: Secondary | ICD-10-CM

## 2023-03-23 DIAGNOSIS — E782 Mixed hyperlipidemia: Secondary | ICD-10-CM

## 2023-03-23 NOTE — Patient Instructions (Addendum)
Take 5 Novolog in am   Take 1 metformin at supper, after 1 week if no diarrhea then 2 at supper

## 2023-03-23 NOTE — Progress Notes (Signed)
Patient ID: Patrick [name], male   DOB: 07-22-1961, 62 y.o.   MRN: [id number]           Reason for Appointment: Type II Diabetes follow-up   History of Present Illness   Diagnosis date: 2015   Previous history:  Non-insulin hypoglycemic drugs previously used: Unknown Insulin was started in 2015, however was off insulin between 2018 and 2020  A1c range in the last few years is: 6.3-8.1  Recent history:     Non-insulin hypoglycemic drugs: None     Insulin regimen: NovoLog 4 units with breakfast and lunch, 4 units before dinner  Lantus 8 units at bedtime    Side effects from medications: None  Current self management, blood sugar patterns and problems identified:  A1c is 7.7 and about the same  His blood sugars are about the same as on the last visit He has not changed his insulin and has not taken metformin He says that he took metformin for a week but because it was stopped at the hospital during his surgery he did not start back on it However he had a little diarrhea with this but no effects of low blood sugar at this time His blood sugars are showing more variability including overnight Generally appears to have higher readings after breakfast and periodically late evening from a snack He has gained weight possibly from being less active Otherwise has not changed his diet Currently no hypoglycemia  Exercise: Trying to walk fairly regularly  Diet management: Breakfast usually egg, potatoes or other starch, meat Dinner usually 6 pm  Dexcom CGM analysis for the last 2 weeks days is interpreted as below  GMI is 7% OVERNIGHT blood sugars are higher in the last week compared to the previous week and at times appear to be higher until about 6-7 AM, usually averaging in the 170s with some variability at the highest level No hypoglycemia overnight and has occasional artifacts  Postprandial readings are averaging about 180 but at least in the last week going over 180 more than  half the time Blood sugars after lunch are generally level but not variable at dinnertime Blood sugars sometimes tend to rise more significantly late in the evening after 10-11 PM  CGM use % of time   2-week average/GV 154  Time in range 77  % Time Above 180 22+1  % Time above 250   % Time Below 70      PRE-MEAL Fasting Lunch Dinner Bedtime Overall  Glucose range:       Averages: 130       POST-MEAL PC Breakfast PC Lunch PC Dinner  Glucose range:     Averages: 183     Previously:  CGM use % of time   2-week average/GV 159/26  Time in range   74     %  % Time Above 180 26  % Time above 250   % Time Below 70      PRE-MEAL Fasting Lunch Dinner Bedtime Overall  Glucose range:       Averages: 168       POST-MEAL PC Breakfast PC Lunch PC Dinner  Glucose range:     Averages:  177     Dietician visit: Most recent: Years ago     Weight control:  Wt Readings from Last 3 Encounters:  03/23/23 176 lb (79.8 kg)  01/10/23 176 lb (79.8 kg)  12/27/22 170 lb (77.1 kg)  Diabetes labs:  Lab Results  Component Value Date   HGBA1C 7.7 (H) 03/20/2023   HGBA1C 7.8 (A) 01/10/2023   HGBA1C 7.7 (H) 09/02/2022   Lab Results  Component Value Date   MICROALBUR <0.7 06/17/2022   LDLCALC 67 09/02/2022   CREATININE 1.44 03/20/2023    No results found for: "FRUCTOSAMINE"   Allergies as of 03/23/2023   No Known Allergies      Medication List        Accurate as of Mar 23, 2023  4:20 PM. If you have any questions, ask your nurse or doctor.          STOP taking these medications    sildenafil 25 MG tablet Commonly known as: VIAGRA Stopped by: Reather Littler, MD       TAKE these medications    Accu-Chek Aviva Plus test strip Generic drug: glucose blood 1 EACH BY OTHER ROUTE 4 (FOUR) TIMES DAILY   acetaminophen 500 MG tablet Commonly known as: TYLENOL Take 500 mg by mouth every 6 (six) hours as needed.   B-D UF III MINI PEN NEEDLES 31G X 5 MM  Misc Generic drug: Insulin Pen Needle USE 3 TIMES DAILY   Blood Pressure Monitor Devi Use as directed to check home blood pressure at least 4 times a week. Please notify primary provider of blood pressures greater than 140/90. Please send prescription to [facility].  Phone Number [phone] Fax Number [phone]   cyanocobalamin 100 MCG tablet Take 1 tablet by mouth daily.   cycloSPORINE 0.05 % ophthalmic emulsion Commonly known as: RESTASIS Place 1 drop into both eyes 2 (two) times daily.   Dexcom G7 Receiver Devi Use to check blood sugar daily   Dexcom G7 Sensor Misc Change every 10 days   gabapentin 300 MG capsule Commonly known as: NEURONTIN TAKE 1 CAPSULE BY MOUTH EVERYDAY AT BEDTIME   Lantus SoloStar 100 UNIT/ML Solostar Pen Generic drug: insulin glargine Inject 8 Units into the skin at bedtime.   latanoprost 0.005 % ophthalmic solution Commonly known as: XALATAN SMARTSIG:1 Drop(s) In Eye(s) Every Evening   losartan-hydrochlorothiazide 100-25 MG tablet Commonly known as: HYZAAR Take 1 tablet by mouth daily.   metFORMIN 500 MG tablet Commonly known as: GLUCOPHAGE Take 1 tablet (500 mg total) by mouth 2 (two) times daily with a meal.   multivitamin capsule Take 1 capsule by mouth daily.   multivitamin with minerals Tabs tablet Take 1 tablet by mouth daily.   NovoLOG FlexPen 100 UNIT/ML FlexPen Generic drug: insulin aspart 4 times a day (just before each meal): 6-6-4-2 units   simvastatin 20 MG tablet Commonly known as: ZOCOR TAKE 1 TABLET BY MOUTH EVERYDAY AT BEDTIME   STOOL SOFTENER PO Take by mouth.   tadalafil 5 MG tablet Commonly known as: CIALIS Take 5 mg by mouth daily.   Vitamin D-1000 Max St 25 MCG (1000 UT) tablet Generic drug: Cholecalciferol Take 1,000 Units by mouth daily.        Allergies: No Known Allergies  Past Medical History:  Diagnosis Date   Cirrhosis (HCC)    Diabetes (HCC)    type 1   Diabetes mellitus  without complication (HCC)    Phreesia 02/21/2021   ETOH abuse     Past Surgical History:  Procedure Laterality Date   EYE SURGERY N/A    Phreesia 02/21/2021   MOUTH SURGERY     on gums   TONSILLECTOMY      Family History  Problem Relation Age of  Onset   Heart disease Mother        bi-pass   Hypertension Mother    Cancer Brother        unsure type   Hypertension Father    Colon cancer Neg Hx     Social History:  reports that he quit smoking about 12 years ago. His smoking use included cigarettes. He has been exposed to tobacco smoke. He has never used smokeless tobacco. He reports that he does not drink alcohol and does not use drugs.  Review of Systems:  Last diabetic eye exam date 9/22  Last foot exam date: 5/24  Symptoms of neuropathy: None  Hypertension: Controlled, Treatment includes losartan  BP Readings from Last 3 Encounters:  03/23/23 128/72  01/10/23 134/80  12/27/22 136/72    Lipid management: On Zocor 20 mg daily since 8/23 He also has followed his diet LDL and triglycerides are controlled HDL low    Lab Results  Component Value Date   CHOL 132 09/02/2022   CHOL 196 06/17/2022   CHOL 155 03/01/2014   Lab Results  Component Value Date   HDL 34.20 (L) 09/02/2022   HDL 30.20 (L) 06/17/2022   HDL 30 (L) 03/01/2014   Lab Results  Component Value Date   LDLCALC 67 09/02/2022   LDLCALC 105 (H) 03/01/2014   Lab Results  Component Value Date   TRIG 155.0 (H) 09/02/2022   TRIG 303.0 (H) 06/17/2022   TRIG 101 03/01/2014   Lab Results  Component Value Date   CHOLHDL 4 09/02/2022   CHOLHDL 6 06/17/2022   CHOLHDL 5.2 03/01/2014   Lab Results  Component Value Date   LDLDIRECT 113.0 06/17/2022   Has a PSA of 13 and is going to get more treatment for possibly metastatic disease   Examination:   BP 128/72   Pulse 87   Ht 5\' 10"  (1.778 m)   Wt 176 lb (79.8 kg)   SpO2 97%   BMI 25.25 kg/m   Body mass index is 25.25 kg/m.    Diabetic Foot Exam - Simple   Simple Foot Form Diabetic Foot exam was performed with the following findings: Yes   Visual Inspection No deformities, no ulcerations, no other skin breakdown bilaterally: Yes Sensation Testing Intact to touch and monofilament testing bilaterally: Yes Pulse Check Posterior Tibialis and Dorsalis pulse intact bilaterally: Yes Comments     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  Current regimen: Low-dose basal bolus insulin  See history of present illness for detailed discussion of current diabetes management, blood sugar patterns and problems identified  A1c is 7.7  Blood glucose control is fair with A1c about the same although GMI on his recent Dexcom is down to 7% As before he is not requiring much insulin especially basal but not able to increase his Lantus insulin since he wakes up with lower readings with more than 8 units Again discussed that he can benefit from metformin to reduce overnight hyperglycemia   Recommendations:  Again try METFORMIN 500 mg regular tablets at dinnertime for at least 1 week and then go up to 1000 mg at dinnertime if tolerated  No change in Lantus at this time but may reduce his by 1 to 2 units if getting low on the morning Take at least 5 units to cover breakfast Balanced meals with some protein at each meal and avoid sweet snacks at night   HYPERTENSION: Mild and well-controlled  Lipids: Will recheck on the next visit,  currently on simvastatin  Patient Instructions  Take 5 Novolog in am   Take 1 metformin at supper, after 1 week if no diarrhea then 2 at supper     Reather Littler 03/23/2023, 4:20 PM

## 2023-03-27 ENCOUNTER — Encounter: Payer: Self-pay | Admitting: Family

## 2023-03-27 ENCOUNTER — Encounter: Payer: Self-pay | Admitting: Endocrinology

## 2023-03-27 ENCOUNTER — Ambulatory Visit (INDEPENDENT_AMBULATORY_CARE_PROVIDER_SITE_OTHER): Payer: Medicare Other | Admitting: Family

## 2023-03-27 VITALS — BP 136/72 | HR 65 | Temp 98.0°F | Resp 14 | Ht 70.0 in | Wt 178.0 lb

## 2023-03-27 DIAGNOSIS — I10 Essential (primary) hypertension: Secondary | ICD-10-CM

## 2023-03-27 MED ORDER — LOSARTAN POTASSIUM-HCTZ 100-25 MG PO TABS: 1.0000 | ORAL_TABLET | Freq: Every day | ORAL | 0 refills | Status: DC

## 2023-03-27 NOTE — Progress Notes (Signed)
Pt is here for chronic care mgt    

## 2023-03-29 ENCOUNTER — Telehealth: Payer: Self-pay

## 2023-03-29 DIAGNOSIS — E1042 Type 1 diabetes mellitus with diabetic polyneuropathy: Secondary | ICD-10-CM

## 2023-03-29 MED ORDER — LANTUS SOLOSTAR 100 UNIT/ML ~~LOC~~ SOPN
8.0000 [IU] | PEN_INJECTOR | Freq: Every day | SUBCUTANEOUS | 99 refills | Status: DC
Start: 2023-03-29 — End: 2023-10-17

## 2023-03-29 MED ORDER — NOVOLOG FLEXPEN 100 UNIT/ML ~~LOC~~ SOPN
PEN_INJECTOR | SUBCUTANEOUS | 3 refills | Status: DC
Start: 2023-03-29 — End: 2023-10-17

## 2023-03-29 NOTE — Telephone Encounter (Signed)
Orders Signed This Visit (2)   insulin aspart (NOVOLOG FLEXPEN) 100 UNIT/ML FlexPen        4 times a day (just before each meal): 6-6-4-2 units, Normal     insulin glargine (LANTUS SOLOSTAR) 100 UNIT/ML Solostar Pen        Inject 8 Units into the skin at bedtime., Starting Wed 03/29/2023, Normal

## 2023-03-31 ENCOUNTER — Ambulatory Visit: Payer: Medicaid Other | Admitting: *Deleted

## 2023-04-25 ENCOUNTER — Other Ambulatory Visit: Payer: Medicare Other | Admitting: *Deleted

## 2023-04-25 NOTE — Patient Outreach (Signed)
   Embedded Care Coordination  Case Closure Note  04/25/2023 Name: Patrick [name] MRN: [id number] DOB: Jan 04, 1961  Patrick [name] is a 62 y.o. year old male who is a primary care patient of [name], NP. The Embedded Care Coordination team was consulted for assistance with chronic disease management and care coordination needs related to DM  The patient has met all care management goals, agreed to case closure, and has been provided with contact information for the care management team. Appropriate care team members and provider have been notified via electronic communication. The care management team is available to provide care management/care coordination support at any time in the future should needs arise. Further engagement requires referral order ([id number]).   If patient returns call to provider office and is in need of assistance from the embedded care coordination team, please advise that the patient call the [facility] Care Guide at [phone] for assistance.   [name] RN, BSN [place]  [facility] RN Care Coordinator [phone]

## 2023-05-05 ENCOUNTER — Other Ambulatory Visit: Payer: Self-pay | Admitting: Endocrinology

## 2023-05-20 ENCOUNTER — Other Ambulatory Visit: Payer: Self-pay | Admitting: Family

## 2023-05-20 DIAGNOSIS — I10 Essential (primary) hypertension: Secondary | ICD-10-CM

## 2023-05-25 ENCOUNTER — Other Ambulatory Visit: Payer: Self-pay | Admitting: Endocrinology

## 2023-06-28 ENCOUNTER — Other Ambulatory Visit: Payer: Self-pay | Admitting: Family

## 2023-06-28 DIAGNOSIS — I10 Essential (primary) hypertension: Secondary | ICD-10-CM

## 2023-06-29 NOTE — Telephone Encounter (Signed)
Request is too soon for refill. Last refill 05/22/23 for 90 days.  Requested Prescriptions  Pending Prescriptions Disp Refills   losartan-hydrochlorothiazide (HYZAAR) 100-25 MG tablet [Pharmacy Med Name: LOSARTAN-HCTZ 100-25 MG TAB] 90 tablet 0    Sig: TAKE 1 TABLET BY MOUTH EVERY DAY     Cardiovascular: ARB + Diuretic Combos Passed - 06/28/2023  3:49 PM      Passed - K in normal range and within 180 days    Potassium  Date Value Ref Range Status  03/20/2023 4.1 3.5 - 5.1 mEq/L Final         Passed - Na in normal range and within 180 days    Sodium  Date Value Ref Range Status  03/20/2023 141 135 - 145 mEq/L Final  05/30/2022 140 134 - 144 mmol/L Final         Passed - Cr in normal range and within 180 days    Creatinine, Ser  Date Value Ref Range Status  03/20/2023 1.44 0.40 - 1.50 mg/dL Final   Creatinine,U  Date Value Ref Range Status  06/17/2022 96.8 mg/dL Final   Creatinine, Urine  Date Value Ref Range Status  08/23/2012 198.50 mg/dL Final         Passed - eGFR is 10 or above and within 180 days    GFR calc Af Amer  Date Value Ref Range Status  03/02/2014 >90 >90 mL/min Final    Comment:    (NOTE) The eGFR has been calculated using the CKD EPI equation. This calculation has not been validated in all clinical situations. eGFR's persistently <90 mL/min signify possible Chronic Kidney Disease.   GFR, Estimated  Date Value Ref Range Status  01/22/2021 >60 >60 mL/min Final    Comment:    (NOTE) Calculated using the CKD-EPI Creatinine Equation (2021)    GFR  Date Value Ref Range Status  03/20/2023 52.70 (L) >60.00 mL/min Final    Comment:    Calculated using the CKD-EPI Creatinine Equation (2021)   eGFR  Date Value Ref Range Status  05/30/2022 65 >59 mL/min/1.73 Final         Passed - Patient is not pregnant      Passed - Last BP in normal range    BP Readings from Last 1 Encounters:  03/27/23 136/72         Passed - Valid encounter within last 6  months    Recent Outpatient Visits           3 months ago Primary hypertension   [place] [facility], [place], [place]   6 months ago Primary hypertension   [place] [facility], [place], [place]   9 months ago Primary hypertension   [place] [facility], [place], [place]   1 year ago Essential (primary) hypertension   [place] [facility], [place], [place]   1 year ago Essential (primary) hypertension   [place] [facility], [place], [place]       Future Appointments             In 3 months [name], NP [facility]

## 2023-07-08 ENCOUNTER — Other Ambulatory Visit: Payer: Self-pay | Admitting: Family

## 2023-07-08 DIAGNOSIS — I10 Essential (primary) hypertension: Secondary | ICD-10-CM

## 2023-07-08 DIAGNOSIS — E1042 Type 1 diabetes mellitus with diabetic polyneuropathy: Secondary | ICD-10-CM

## 2023-07-11 NOTE — Telephone Encounter (Signed)
Complete

## 2023-07-13 ENCOUNTER — Other Ambulatory Visit (INDEPENDENT_AMBULATORY_CARE_PROVIDER_SITE_OTHER): Payer: 59

## 2023-07-13 DIAGNOSIS — E1065 Type 1 diabetes mellitus with hyperglycemia: Secondary | ICD-10-CM | POA: Diagnosis not present

## 2023-07-13 DIAGNOSIS — E782 Mixed hyperlipidemia: Secondary | ICD-10-CM | POA: Diagnosis not present

## 2023-07-13 LAB — COMPREHENSIVE METABOLIC PANEL
ALT: 23 U/L (ref 0–53)
AST: 28 U/L (ref 0–37)
Albumin: 4.7 g/dL (ref 3.5–5.2)
Alkaline Phosphatase: 62 U/L (ref 39–117)
BUN: 20 mg/dL (ref 6–23)
CO2: 31 mEq/L (ref 19–32)
Calcium: 9.9 mg/dL (ref 8.4–10.5)
Chloride: 100 mEq/L (ref 96–112)
Creatinine, Ser: 1.35 mg/dL (ref 0.40–1.50)
GFR: 56.82 mL/min — ABNORMAL LOW (ref >60.00)
Glucose, Bld: 71 mg/dL (ref 70–99)
Potassium: 4 mEq/L (ref 3.5–5.1)
Sodium: 140 mEq/L (ref 135–145)
Total Bilirubin: 0.5 mg/dL (ref 0.2–1.2)
Total Protein: 7.8 g/dL (ref 6.0–8.3)

## 2023-07-13 LAB — LIPID PANEL
Cholesterol: 141 mg/dL (ref 0–200)
HDL: 36.2 mg/dL — ABNORMAL LOW (ref >39.00)
LDL Cholesterol: 83 mg/dL (ref 0–99)
NonHDL: 105.17
Total CHOL/HDL Ratio: 4
Triglycerides: 111 mg/dL (ref 0.0–149.0)
VLDL: 22.2 mg/dL (ref 0.0–40.0)

## 2023-07-13 LAB — HEMOGLOBIN A1C: Hgb A1c MFr Bld: 7.2 % — ABNORMAL HIGH (ref 4.6–6.5)

## 2023-07-17 ENCOUNTER — Ambulatory Visit (INDEPENDENT_AMBULATORY_CARE_PROVIDER_SITE_OTHER): Payer: 59 | Admitting: Endocrinology

## 2023-07-17 ENCOUNTER — Encounter: Payer: Self-pay | Admitting: Endocrinology

## 2023-07-17 VITALS — BP 122/80 | HR 98 | Ht 70.0 in | Wt 167.0 lb

## 2023-07-17 DIAGNOSIS — Z794 Long term (current) use of insulin: Secondary | ICD-10-CM | POA: Diagnosis not present

## 2023-07-17 DIAGNOSIS — E1042 Type 1 diabetes mellitus with diabetic polyneuropathy: Secondary | ICD-10-CM

## 2023-07-17 LAB — MICROALBUMIN / CREATININE URINE RATIO
Creatinine,U: 115.7 mg/dL
Microalb Creat Ratio: 2.6 mg/g (ref 0.0–30.0)
Microalb, Ur: 3 mg/dL — ABNORMAL HIGH (ref 0.0–1.9)

## 2023-07-17 LAB — GLUCOSE, RANDOM: Glucose, Bld: 156 mg/dL — ABNORMAL HIGH (ref 70–99)

## 2023-07-17 MED ORDER — GVOKE HYPOPEN 1-PACK 1 MG/0.2ML ~~LOC~~ SOAJ: 1.0000 mg | SUBCUTANEOUS | 2 refills | Status: AC | PRN

## 2023-07-17 NOTE — Patient Instructions (Signed)
Stop Metformin.   Insulin same , if you still have low glucose , let me know , call our clinic.

## 2023-07-17 NOTE — Progress Notes (Signed)
Outpatient Endocrinology Note [place] Freemon Binford, MD  07/17/23  Patient's Name: Patrick [name]    DOB: 1961/10/17    MRN: [id number]                                                    REASON OF VISIT: Follow up for type 1 diabetes mellitus  PCP: [name], NP  HISTORY OF PRESENT ILLNESS:   QUILLEN PON is a 62 y.o. old male with past medical history listed below, is here for follow up of type 1 diabetes mellitus.  Patient was last seen by Dr. Lucianne Muss in May 2024.  Pertinent Diabetes History: He was diagnosed with type 1 diabetes mellitus in 2015.  Patient has diabetes mellitus type 1 versus due to chronic pancreatitis / pancreatic diabetes.  He is being managed as type 1 diabetes mellitus.  He has history of diabetes ketoacidosis at the time of diagnosis.  Pancreatic imaging 2007 pancreatic calcification present.  In 2017 he was in the partial remission was off of insulin from 2018-2020.  He stopped drinking EtOH in 2010.  Insulin pump therapy was discussed and not considered in the past. Hemoglobin A1c mostly in the range of 6.3 to 8.1%.  Chronic Diabetes Complications : Retinopathy: no. Last ophthalmology exam was done on annually reportedly. Nephropathy: CKD stage III, on losartan. Peripheral neuropathy: yes, on gabapentin. Coronary artery disease: no Stroke: no  Relevant comorbidities and cardiovascular risk factors: Obesity: no Body mass index is 23.96 kg/m.  Hypertension: yes Hyperlipidemia. Yes, on simvastatin.  Current / Home Diabetic regimen includes: Lantus 8 units at bedtime. NovoLog 4 units with meals 3 times a day. Metformin 500 mg 1 tablet in the evening.  Prior diabetic medications:  Glycemic data:    CONTINUOUS GLUCOSE MONITORING SYSTEM (CGMS) INTERPRETATION: At today's visit, we reviewed CGM downloads. The full report is scanned in the media. Reviewing the CGM trends, blood glucose are as follows:  Dexcom G7 CGM-  Sensor Download (Sensor download was  reviewed and summarized below.) Dates: August 13-July 17, 2023 for 14 days.  Glucose Management Indicator: 6.5% Sensor Average: 134 SD 35  Glycemic Trends:  <54: <1% 54-70: 1% 71-180: 88% 181-250: 11% 251-400: 0%  Interpretation: -Most of the time he had acceptable blood sugar.  He had occasional postprandial hyperglycemia related with lunch and supper with blood sugar up to 200-250 range.  Sometimes trending down blood sugar to the lower normal range overnight and had 1 episode of hypoglycemia today on August 26 in the early morning starting around 2 to 6 AM, with blood sugar in 50s.  Hypoglycemia: Patient has one hypoglycemic episodes. Patient has hypoglycemia awareness.  Factors modifying glucose control: 1.  Diabetic diet assessment: 3 meals a day breakfast, lunch and supper.  2.  Staying active or exercising: Walks regularly.  3.  Medication compliance: compliant all of the time.  Interval history 07/17/23 Patient has noticed low blood sugar / some time with hypoglycemia after being on metformin.  Metformin was started from last visit in May 2024.  He thinks he did not have hypoglycemia with not being on metformin.  Diabetes regimen as reviewed above.  CGM Dexcom data as reviewed above.  He usually takes NovoLog 4 units with meals however sometime he takes 2 units or  he does not take at all during  at the time of increased physical activity or exercise or when his blood sugar is already low.  Recent labs reviewed with acceptable cholesterol level LDL 83.  Renal function is stable with EGFR 56.  Hemoglobin A1c 7.2%.  REVIEW OF SYSTEMS As per history of present illness.   PAST MEDICAL HISTORY: Past Medical History:  Diagnosis Date   Cirrhosis (HCC)    Diabetes (HCC)    type 1   Diabetes mellitus without complication (HCC)    Phreesia 02/21/2021   ETOH abuse     PAST SURGICAL HISTORY: Past Surgical History:  Procedure Laterality Date   EYE SURGERY N/A    Phreesia  02/21/2021   MOUTH SURGERY     on gums   TONSILLECTOMY      ALLERGIES: No Known Allergies  FAMILY HISTORY:  Family History  Problem Relation Age of Onset   Heart disease Mother        bi-pass   Hypertension Mother    Cancer Brother        unsure type   Hypertension Father    Colon cancer Neg Hx     SOCIAL HISTORY: Social History   Socioeconomic History   Marital status: Single    Spouse name: Not on file   Number of children: 1   Years of education: Not on file   Highest education level: Associate degree: occupational, Scientist, product/process development, or vocational program  Occupational History   Occupation: unemployed  Tobacco Use   Smoking status: Former    Current packs/day: 0.00    Types: Cigarettes    Quit date: 07/15/1995    Years since quitting: 28.0    Passive exposure: Past   Smokeless tobacco: Never  Substance and Sexual Activity   Alcohol use: No    Comment: Not using for last 11 years   Drug use: No   Sexual activity: Not on file  Other Topics Concern   Not on file  Social History Narrative   Not on file   Social Determinants of Health   Financial Resource Strain: Low Risk  (07/14/2021)   Overall Financial Resource Strain (CARDIA)    Difficulty of Paying Living Expenses: Not very hard  Food Insecurity: No Food Insecurity (03/23/2023)   Hunger Vital Sign    Worried About Running Out of Food in the Last Year: Never true    Ran Out of Food in the Last Year: Never true  Transportation Needs: No Transportation Needs (04/25/2023)   PRAPARE - Administrator, Civil Service (Medical): No    Lack of Transportation (Non-Medical): No  Physical Activity: Sufficiently Active (03/23/2023)   Exercise Vital Sign    Days of Exercise per Week: 5 days    Minutes of Exercise per Session: 30 min  Stress: No Stress Concern Present (03/23/2023)   [employer] of Occupational Health - Occupational Stress Questionnaire    Feeling of Stress : Not at all  Social Connections:  Unknown (03/23/2023)   Social Connection and Isolation Panel [NHANES]    Frequency of Communication with Friends and Family: More than three times a week    Frequency of Social Gatherings with Friends and Family: More than three times a week    Attends Religious Services: More than 4 times per year    Active Member of [employer] or Organizations: Not on file    Attends Banker Meetings: Not on file    Marital Status: Living with partner    MEDICATIONS:  Current Outpatient Medications  Medication Sig Dispense Refill   acetaminophen (TYLENOL) 500 MG tablet Take 500 mg by mouth every 6 (six) hours as needed.     Blood Pressure Monitor DEVI Use as directed to check home blood pressure at least 4 times a week. Please notify primary provider of blood pressures greater than 140/90. Please send prescription to [facility].  Phone Number [phone] Fax Number [phone] 1 each 0   Cholecalciferol (VITAMIN D-1000 MAX ST) 25 MCG (1000 UT) tablet Take 1,000 Units by mouth daily.     Continuous Blood Gluc Receiver (DEXCOM G7 RECEIVER) DEVI Use to check blood sugar daily 1 each 0   Continuous Blood Gluc Sensor (DEXCOM G7 SENSOR) MISC Change every 10 days 9 each 2   cyanocobalamin 100 MCG tablet Take 1 tablet by mouth daily.     cycloSPORINE (RESTASIS) 0.05 % ophthalmic emulsion Place 1 drop into both eyes 2 (two) times daily.     Docusate Calcium (STOOL SOFTENER PO) Take by mouth.     gabapentin (NEURONTIN) 300 MG capsule TAKE 1 CAPSULE BY MOUTH EVERYDAY AT BEDTIME 90 capsule 0   Glucagon (GVOKE HYPOPEN 1-PACK) 1 MG/0.2ML SOAJ Inject 1 mg into the skin as needed (low blood sugar with impaired consciousness). 0.4 mL 2   glucose blood (ACCU-CHEK AVIVA PLUS) test strip 1 EACH BY OTHER ROUTE 4 (FOUR) TIMES DAILY 100 strip 13   insulin aspart (NOVOLOG FLEXPEN) 100 UNIT/ML FlexPen 4 times a day (just before each meal): 6-6-4-2 units 30 mL 3   insulin glargine (LANTUS SOLOSTAR) 100 UNIT/ML  Solostar Pen Inject 8 Units into the skin at bedtime. 15 mL PRN   Insulin Pen Needle (B-D UF III MINI PEN NEEDLES) 31G X 5 MM MISC USE 3 TIMES DAILY 300 each 2   latanoprost (XALATAN) 0.005 % ophthalmic solution SMARTSIG:1 Drop(s) In Eye(s) Every Evening     losartan-hydrochlorothiazide (HYZAAR) 100-25 MG tablet TAKE 1 TABLET BY MOUTH EVERY DAY 90 tablet 0   metFORMIN (GLUCOPHAGE) 500 MG tablet Take 1 tablet (500 mg total) by mouth 2 (two) times daily with a meal. (Patient taking differently: Take 500 mg by mouth daily at 6 (six) AM.) 180 tablet 3   Multiple Vitamin (MULTIVITAMIN WITH MINERALS) TABS tablet Take 1 tablet by mouth daily.     Multiple Vitamin (MULTIVITAMIN) capsule Take 1 capsule by mouth daily.     simvastatin (ZOCOR) 20 MG tablet TAKE 1 TABLET BY MOUTH EVERYDAY AT BEDTIME 90 tablet 3   tadalafil (CIALIS) 5 MG tablet Take 5 mg by mouth daily.     No current facility-administered medications for this visit.    PHYSICAL EXAM: Vitals:   07/17/23 0806  BP: 122/80  Pulse: 98  SpO2: 99%  Weight: 167 lb (75.8 kg)  Height: 5\' 10"  (1.778 m)   Body mass index is 23.96 kg/m.  Wt Readings from Last 3 Encounters:  07/17/23 167 lb (75.8 kg)  03/27/23 178 lb (80.7 kg)  03/23/23 176 lb (79.8 kg)    General: Well developed, well nourished male in no apparent distress.  HEENT: AT/[place], no external lesions.  Eyes: Conjunctiva clear and no icterus. Neck: Neck supple  Lungs: Respirations not labored Neurologic: Alert, oriented, normal speech Extremities / Skin: Dry. No sores or rashes noted. No acanthosis nigricans Psychiatric: Does not appear depressed or anxious  Diabetic Foot Exam - Simple   No data filed     LABS Reviewed Lab Results  Component Value Date   HGBA1C 7.2 (H) 07/13/2023  HGBA1C 7.7 (H) 03/20/2023   HGBA1C 7.8 (A) 01/10/2023   No results found for: "FRUCTOSAMINE" Lab Results  Component Value Date   CHOL 141 07/13/2023   HDL 36.20 (L) 07/13/2023    LDLCALC 83 07/13/2023   LDLDIRECT 113.0 06/17/2022   TRIG 111.0 07/13/2023   CHOLHDL 4 07/13/2023   Lab Results  Component Value Date   MICRALBCREAT 0.7 06/17/2022   MICRALBCREAT 17 04/07/2021   Lab Results  Component Value Date   CREATININE 1.35 07/13/2023   Lab Results  Component Value Date   GFR 56.82 (L) 07/13/2023    ASSESSMENT / PLAN  1. Type 1 diabetes mellitus with diabetic polyneuropathy (HCC)   2. Long term current use of insulin (HCC)     Diabetes Mellitus type 1, complicated by peripheral neuropathy and CKD - Diabetic status / severity: Fair control.  Lab Results  Component Value Date   HGBA1C 7.2 (H) 07/13/2023    - Hemoglobin A1c goal : <7%  Patient reports hypoglycemia when being on metformin.  Patient was being managed as type 1 diabetes mellitus.  There was concern of diabetes mellitus related to chronic pancreatitis.  I would like to check type 1 diabetes autoimmune panel as well.   - Medications:   I) stop metformin. II) continue Lantus 8 units daily. III) continue NovoLog 4 units with meals 3 times a day.  Okay to adjust NovoLog, take less during increased physical activity and eating less.  - Home glucose testing: DEXCOM G7 / check as needed.  - Discussed/ Gave Hypoglycemia treatment plan. Glucagon emergency kit / Gvoke ordered. Use glucose tablet as needed.   # Consult : not required at this time.   # Annual urine for microalbuminuria/ creatinine ratio, no microalbuminuria currently, continue ACE/ARB / losartan, will check today. Last  Lab Results  Component Value Date   MICRALBCREAT 0.7 06/17/2022    # Foot check nightly / neuropathy, continue gabapentin.   # Annual dilated diabetic eye exams.   - Diet: Eat reasonable portion sizes to promote a healthy weight - Life style / activity / exercise: discussed.   2. Blood pressure  -  BP Readings from Last 1 Encounters:  07/17/23 122/80    - Control is in target.  - No change in  current plans.  3. Lipid status / Hyperlipidemia - Last  Lab Results  Component Value Date   LDLCALC 83 07/13/2023   - Continue simvastatin 20 mg daily.    Barrie "Tini" was seen today for follow-up.  Diagnoses and all orders for this visit:  Type 1 diabetes mellitus with diabetic polyneuropathy (HCC) -     IA-2 Antibody; Future -     ZNT8 Antibodies; Future -     C-peptide; Future -     Glucose, random; Future -     Microalbumin / creatinine urine ratio; Future -     Glutamic acid decarboxylase auto abs; Future -     Glutamic acid decarboxylase auto abs -     Microalbumin / creatinine urine ratio -     Glucose, random -     C-peptide -     ZNT8 Antibodies -     IA-2 Antibody  Long term current use of insulin (HCC)  Other orders -     Glucagon (GVOKE HYPOPEN 1-PACK) 1 MG/0.2ML SOAJ; Inject 1 mg into the skin as needed (low blood sugar with impaired consciousness).    DISPOSITION Follow up in clinic in 3 months suggested.  All questions answered and patient verbalized understanding of the plan.  Iraq Bowman Higbie, MD [facility] [address] [place], [address] [place], [place] [address] Phone # [phone]  At least part of this note was generated using voice recognition software. Inadvertent word errors may have occurred, which were not recognized during the proofreading process.

## 2023-07-18 ENCOUNTER — Encounter: Payer: Self-pay | Admitting: Endocrinology

## 2023-07-25 LAB — C-PEPTIDE: C-Peptide: 0.84 ng/mL (ref 0.80–3.85)

## 2023-07-25 LAB — GLUTAMIC ACID DECARBOXYLASE AUTO ABS: Glutamic Acid Decarb Ab: <5 [IU]/mL (ref <5)

## 2023-07-25 LAB — ZNT8 ANTIBODIES: ZNT8 Antibodies: <10 U/mL (ref <15)

## 2023-07-25 LAB — IA-2 ANTIBODY: IA-2 Antibody: <5.4 U/mL (ref <5.4)

## 2023-09-08 ENCOUNTER — Telehealth: Payer: Self-pay | Admitting: Family

## 2023-09-08 NOTE — Telephone Encounter (Signed)
Patient dropped off document Handicap Placard, to be filled out by provider. Patient requested to send it back via Call Patient to pick up within 5-days. Document is located in providers tray at front office.Please advise at Mobile [phone] (mobile)

## 2023-09-08 NOTE — Telephone Encounter (Signed)
Copied from CRM [id number]. Topic: General - Inquiry >> Sep 07, 2023  2:56 PM Runell Gess P wrote: Reason for CRM: pt called asking for the form for the handicap plackard.  [phone]

## 2023-09-11 NOTE — Telephone Encounter (Signed)
Handicap placard filled out and left up front.

## 2023-09-27 ENCOUNTER — Ambulatory Visit: Payer: Medicaid Other | Admitting: Family

## 2023-10-11 ENCOUNTER — Encounter: Payer: Self-pay | Admitting: Family

## 2023-10-11 ENCOUNTER — Ambulatory Visit (INDEPENDENT_AMBULATORY_CARE_PROVIDER_SITE_OTHER): Payer: 59 | Admitting: Family

## 2023-10-11 VITALS — BP 133/73 | HR 97 | Temp 98.4°F | Ht 71.0 in | Wt 160.8 lb

## 2023-10-11 DIAGNOSIS — I10 Essential (primary) hypertension: Secondary | ICD-10-CM | POA: Diagnosis not present

## 2023-10-11 MED ORDER — LOSARTAN POTASSIUM-HCTZ 100-25 MG PO TABS
1.0000 | ORAL_TABLET | Freq: Every day | ORAL | 0 refills | Status: DC
Start: 2023-10-11 — End: 2024-02-06

## 2023-10-11 NOTE — Progress Notes (Signed)
Patient states no concerns to discuss

## 2023-10-11 NOTE — Progress Notes (Signed)
Patient ID: Patrick [name], male    DOB: 1961-01-08  MRN: [id number]  CC: Chronic Conditions Follow-Up  Subjective: Patrick [name] is a 62 y.o. male who presents for chronic conditions follow-up.  His concerns today include:  - Doing well on Losartan-Hydrochlorothiazide, no issues/concerns. He does not complain of red flag symptoms such as but not limited to chest pain, shortness of breath, worst headache of life, nausea/vomiting.    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) 03/22/2014   Cirrhosis of liver (HCC) 03/14/2014   Colon cancer screening 03/14/2014   Hypotension, unspecified 03/01/2014   DKA (diabetic ketoacidosis) (HCC) 02/28/2014   Hyponatremia 02/28/2014   Hepatic encephalopathy (HCC) 08/24/2012   Anemia 08/23/2012   Malnutrition (HCC) 08/23/2012   Acute renal failure (HCC) 08/23/2012   Thrombocytopenia (HCC) 08/23/2012   Coagulopathy (HCC) 08/23/2012   Ascites 08/22/2012   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 Please notify primary provider of blood pressures greater than 140/90. Please send prescription to [facility].  Phone Number [phone] Fax Number [phone] 1 each 0   Cholecalciferol (VITAMIN D-1000 MAX ST) 25 MCG (1000 UT) tablet Take 1,000 Units by mouth daily.     Continuous Blood Gluc Receiver (DEXCOM G7 RECEIVER) DEVI Use to check blood sugar daily 1 each 0   Continuous Blood Gluc Sensor (DEXCOM G7 SENSOR) MISC Change every 10 days 9  each 2   cycloSPORINE (RESTASIS) 0.05 % ophthalmic emulsion Place 1 drop into both eyes 2 (two) times daily.     Docusate Calcium (STOOL SOFTENER PO) Take by mouth.     gabapentin (NEURONTIN) 300 MG capsule TAKE 1 CAPSULE BY MOUTH EVERYDAY AT BEDTIME 90 capsule 0   Glucagon (GVOKE HYPOPEN 1-PACK) 1 MG/0.2ML SOAJ Inject 1 mg into the skin as needed (low blood sugar with impaired consciousness). 0.4 mL 2   glucose blood (ACCU-CHEK AVIVA PLUS) test strip 1 EACH BY OTHER ROUTE 4 (FOUR) TIMES DAILY 100 strip 13   insulin aspart (NOVOLOG FLEXPEN) 100 UNIT/ML FlexPen 4 times a day (just before each meal): 6-6-4-2 units 30 mL 3   insulin glargine (LANTUS SOLOSTAR) 100 UNIT/ML Solostar Pen Inject 8 Units into the skin at bedtime. 15 mL PRN   Insulin Pen Needle (B-D UF III MINI PEN NEEDLES) 31G X 5 MM MISC USE 3 TIMES DAILY 300 each 2   latanoprost (XALATAN) 0.005 % ophthalmic solution SMARTSIG:1 Drop(s) In Eye(s) Every Evening     Multiple Vitamin (MULTIVITAMIN WITH MINERALS) TABS tablet Take 1 tablet by mouth daily.     Multiple Vitamin (MULTIVITAMIN) capsule Take 1 capsule by mouth daily.     simvastatin (ZOCOR) 20 MG tablet TAKE 1 TABLET BY MOUTH EVERYDAY AT BEDTIME 90 tablet 3   tadalafil (CIALIS) 5 MG tablet Take 5 mg by mouth daily.     acetaminophen (TYLENOL) 500 MG tablet Take 500 mg by mouth every 6 (six) hours as needed. (Patient not taking: Reported on 10/11/2023)     cyanocobalamin 100 MCG  tablet Take 1 tablet by mouth daily. (Patient not taking: Reported on 10/11/2023)     metFORMIN (GLUCOPHAGE) 500 MG tablet Take 1 tablet (500 mg total) by mouth 2 (two) times daily with a meal. (Patient not taking: Reported on 10/11/2023) 180 tablet 3   No current facility-administered medications on file prior to visit.    No Known Allergies  Social History   Socioeconomic History   Marital status: Single    Spouse name: Not on file   Number of children: 1   Years of education: Not on file    Highest education level: Associate degree: occupational, Scientist, product/process development, or vocational program  Occupational History   Occupation: unemployed  Tobacco Use   Smoking status: Former    Current packs/day: 0.00    Types: Cigarettes    Quit date: 07/15/1995    Years since quitting: 28.2    Passive exposure: Past   Smokeless tobacco: Never  Substance and Sexual Activity   Alcohol use: No    Comment: Not using for last 11 years   Drug use: No   Sexual activity: Not on file  Other Topics Concern   Not on file  Social History Narrative   Not on file   Social Determinants of Health   Financial Resource Strain: Low Risk  (07/14/2021)   Overall Financial Resource Strain (CARDIA)    Difficulty of Paying Living Expenses: Not very hard  Food Insecurity: No Food Insecurity (03/23/2023)   Hunger Vital Sign    Worried About Running Out of Food in the Last Year: Never true    Ran Out of Food in the Last Year: Never true  Transportation Needs: No Transportation Needs (04/25/2023)   PRAPARE - Administrator, Civil Service (Medical): No    Lack of Transportation (Non-Medical): No  Physical Activity: Sufficiently Active (03/23/2023)   Exercise Vital Sign    Days of Exercise per Week: 5 days    Minutes of Exercise per Session: 30 min  Stress: No Stress Concern Present (03/23/2023)   [employer] of Occupational Health - Occupational Stress Questionnaire    Feeling of Stress : Not at all  Social Connections: Unknown (03/23/2023)   Social Connection and Isolation Panel [NHANES]    Frequency of Communication with Friends and Family: More than three times a week    Frequency of Social Gatherings with Friends and Family: More than three times a week    Attends Religious Services: More than 4 times per year    Active Member of Clubs or Organizations: Not on file    Attends Banker Meetings: Not on file    Marital Status: Living with partner  Intimate Partner Violence: Low Risk   (01/20/2023)   Received from [facility] visits prior to 01/21/2023., [facility] visits prior to 01/21/2023.   Safety    How often does anyone, including family and friends, physically hurt you?: Never    How often does anyone, including family and friends, insult or talk down to you?: Never    How often does anyone, including family and friends, threaten you with harm?: Never    How often does anyone, including family and friends, scream or curse at you?: Never    Family History  Problem Relation Age of Onset   Heart disease Mother        bi-pass   Hypertension Mother    Cancer Brother        unsure  type   Hypertension Father    Colon cancer Neg Hx     Past Surgical History:  Procedure Laterality Date   EYE SURGERY N/A    Phreesia 02/21/2021   MOUTH SURGERY     on gums   TONSILLECTOMY      ROS: Review of Systems Negative except as stated above  PHYSICAL EXAM: BP 133/73   Pulse 97   Temp 98.4 F (36.9 C) (Oral)   Ht 5\' 11"  (1.803 m)   Wt 160 lb 12.8 oz (72.9 kg)   SpO2 97%   BMI 22.43 kg/m   Physical Exam HENT:     Head: Normocephalic and atraumatic.     Nose: Nose normal.     Mouth/Throat:     Mouth: Mucous membranes are moist.     Pharynx: Oropharynx is clear.  Eyes:     Extraocular Movements: Extraocular movements intact.     Conjunctiva/sclera: Conjunctivae normal.     Pupils: Pupils are equal, round, and reactive to light.  Cardiovascular:     Rate and Rhythm: Normal rate and regular rhythm.     Pulses: Normal pulses.     Heart sounds: Normal heart sounds.  Pulmonary:     Effort: Pulmonary effort is normal.     Breath sounds: Normal breath sounds.  Musculoskeletal:        General: Normal range of motion.     Cervical back: Normal range of motion and neck supple.  Neurological:     General: No focal deficit present.     Mental Status: He is alert and oriented to person, place, and time.  Psychiatric:         Mood and Affect: Mood normal.        Behavior: Behavior normal.     ASSESSMENT AND PLAN: 1. Primary hypertension - Continue Losartan-Hydrochlorothiazide as prescribed.  - Counseled on blood pressure goal of less than 130/80, low-sodium, DASH diet, medication compliance, and 150 minutes of moderate intensity exercise per week as tolerated. Counseled on medication adherence and adverse effects. - Follow-up with primary provider in 3 months or sooner if needed.  - losartan-hydrochlorothiazide (HYZAAR) 100-25 MG tablet; Take 1 tablet by mouth daily.  Dispense: 90 tablet; Refill: 0   Patient was given the opportunity to ask questions.  Patient verbalized understanding of the plan and was able to repeat key elements of the plan. Patient was given clear instructions to go to Emergency Department or return to medical center if symptoms don't improve, worsen, or new problems develop.The patient verbalized understanding.   Requested Prescriptions   Signed Prescriptions Disp Refills   losartan-hydrochlorothiazide (HYZAAR) 100-25 MG tablet 90 tablet 0    Sig: Take 1 tablet by mouth daily.    Return in about 3 months (around 01/11/2024) for Follow-Up or next available chronic conditions.  [name], NP

## 2023-10-16 ENCOUNTER — Telehealth: Payer: Self-pay

## 2023-10-16 ENCOUNTER — Telehealth: Payer: Self-pay | Admitting: Family

## 2023-10-16 NOTE — Telephone Encounter (Signed)
Samples of this Dexcom G7 were given to the patient, quantity 2, patient called and VM left that they were at front desk

## 2023-10-17 ENCOUNTER — Other Ambulatory Visit: Payer: Self-pay | Admitting: Endocrinology

## 2023-10-17 ENCOUNTER — Encounter: Payer: Self-pay | Admitting: Endocrinology

## 2023-10-17 ENCOUNTER — Ambulatory Visit: Payer: 59 | Admitting: Endocrinology

## 2023-10-17 VITALS — BP 132/76 | HR 74 | Resp 20 | Ht 71.0 in | Wt 162.2 lb

## 2023-10-17 DIAGNOSIS — E1042 Type 1 diabetes mellitus with diabetic polyneuropathy: Secondary | ICD-10-CM

## 2023-10-17 DIAGNOSIS — K869 Disease of pancreas, unspecified: Secondary | ICD-10-CM | POA: Diagnosis not present

## 2023-10-17 DIAGNOSIS — E1069 Type 1 diabetes mellitus with other specified complication: Secondary | ICD-10-CM

## 2023-10-17 DIAGNOSIS — E1169 Type 2 diabetes mellitus with other specified complication: Secondary | ICD-10-CM

## 2023-10-17 LAB — POCT GLYCOSYLATED HEMOGLOBIN (HGB A1C): Hemoglobin A1C: 6.9 % — AB (ref 4.0–5.6)

## 2023-10-17 MED ORDER — LANTUS SOLOSTAR 100 UNIT/ML ~~LOC~~ SOPN: 7.0000 [IU] | PEN_INJECTOR | Freq: Every day | SUBCUTANEOUS | 4 refills | Status: DC

## 2023-10-17 MED ORDER — NOVOLOG FLEXPEN 100 UNIT/ML ~~LOC~~ SOPN
PEN_INJECTOR | SUBCUTANEOUS | 3 refills | Status: DC
Start: 2023-10-17 — End: 2024-04-01

## 2023-10-17 NOTE — Progress Notes (Signed)
Outpatient Endocrinology Note Patrick Jesus Poplin, MD  10/17/23  Patient's Name: Patrick [name]    DOB: February 03, 1961    MRN: [id number]                                                    REASON OF VISIT: Follow up for type 1 diabetes mellitus  PCP: [name], NP  HISTORY OF PRESENT ILLNESS:   Patrick [name] is a 62 y.o. old male with past medical history listed below, is here for follow up of type 1 diabetes mellitus.   Pertinent Diabetes History: He was diagnosed with type 1 diabetes mellitus in 2015.  Patient has diabetes mellitus type 1 versus due to chronic pancreatitis / pancreatic diabetes.  He is being managed as type 1 diabetes mellitus.  He has history of diabetes ketoacidosis at the time of diagnosis.  Pancreatic imaging 2007 pancreatic calcification present.  In 2017 he was in the partial remission was off of insulin from 2018-2020.  He stopped drinking EtOH in 2010.  Insulin pump therapy was discussed and not considered in the past. Hemoglobin A1c mostly in the range of 6.3 to 8.1%.  He has negative type I autoimmune panel for IA 2, zinc transporter 8 and GAD 65 antibody in August 2024, C-peptide 0.84.  Chronic Diabetes Complications : Retinopathy: no. Last ophthalmology exam was done on annually reportedly in 10/16/2023. Following with retina specialist,?  Diabetic retinopathy. Nephropathy: CKD stage III, on losartan. Peripheral neuropathy: yes, on gabapentin. Coronary artery disease: no Stroke: no  Relevant comorbidities and cardiovascular risk factors: Obesity: no Body mass index is 22.62 kg/m.  Hypertension: yes Hyperlipidemia. Yes, on simvastatin.  Current / Home Diabetic regimen includes: Lantus 8 units at bedtime. NovoLog 2 -  4 units with meals 3 times a day.  Prior diabetic medications:  Glycemic data:    CONTINUOUS GLUCOSE MONITORING SYSTEM (CGMS) INTERPRETATION: At today's visit, we reviewed CGM downloads. The full report is scanned in the media. Reviewing  the CGM trends, blood glucose are as follows:  Dexcom G7 CGM-  Sensor Download (Sensor download was reviewed and summarized below.) Dates: November 13 to October 17, 2023 for 14 days.  Glucose Management Indicator:6.6% Sensor Average: 139 SD 41  Glycemic Trends:  <54: <1% 54-70: 2% 71-180: 83% 181-250: 14% 251-400: 1%  Interpretation: Mostly acceptable blood sugar.  He has occasional hypoglycemia mainly in the afternoon with blood sugar in 50s related with the use of NovoLog and increase physical activity/exercise.  He has mild hyperglycemia with blood sugar up to 200 range related with meals occasionally.  Blood sugar overnight mostly acceptable.  He had noted hypoglycemia especially on November 16 and  November 25 in the early morning seems to be related with sensor issue.    Hypoglycemia: Patient has one hypoglycemic episodes. Patient has hypoglycemia awareness.  Factors modifying glucose control: 1.  Diabetic diet assessment: 3 meals a day breakfast, lunch and supper.  2.  Staying active or exercising: Walks regularly.  3.  Medication compliance: compliant all of the time.  Interval history Dexcom CGM data as reviewed above.  He reports he has been exercising 1-2 times a day and especially in the afternoon he required less NovoLog and has been taking 2 units for lunch and supper.  Diabetes regimen as noted above.  He has no  other complaints today.  Hemoglobin A1c today 6.9%.  REVIEW OF SYSTEMS As per history of present illness.   PAST MEDICAL HISTORY: Past Medical History:  Diagnosis Date   Cirrhosis (HCC)    Diabetes (HCC)    type 1   Diabetes mellitus without complication (HCC)    [employer] 02/21/2021   ETOH abuse     PAST SURGICAL HISTORY: Past Surgical History:  Procedure Laterality Date   EYE SURGERY N/A    [employer]reesia 02/21/2021   MOUTH SURGERY     on gums   TONSILLECTOMY      ALLERGIES: No Known Allergies  FAMILY HISTORY:  Family History  Problem  Relation Age of Onset   Heart disease Mother        bi-pass   Hypertension Mother    Cancer Brother        unsure type   Hypertension Father    Colon cancer Neg Hx     SOCIAL HISTORY: Social History   Socioeconomic History   Marital status: Single    Spouse name: Not on file   Number of children: 1   Years of education: Not on file   Highest education level: Associate degree: occupational, Scientist, product/process development, or vocational program  Occupational History   Occupation: unemployed  Tobacco Use   Smoking status: Former    Current packs/day: 0.00    Types: Cigarettes    Quit date: 07/15/1995    Years since quitting: 28.2    Passive exposure: Past   Smokeless tobacco: Never  Substance and Sexual Activity   Alcohol use: No    Comment: Not using for last 11 years   Drug use: No   Sexual activity: Not on file  Other Topics Concern   Not on file  Social History Narrative   Not on file   Social Determinants of Health   Financial Resource Strain: Low Risk  (07/14/2021)   Overall Financial Resource Strain (CARDIA)    Difficulty of Paying Living Expenses: Not very hard  Food Insecurity: No Food Insecurity (03/23/2023)   Hunger Vital Sign    Worried About Running Out of Food in the Last Year: Never true    Ran Out of Food in the Last Year: Never true  Transportation Needs: No Transportation Needs (04/25/2023)   PRAPARE - Administrator, Civil Service (Medical): No    Lack of Transportation (Non-Medical): No  Physical Activity: Sufficiently Active (03/23/2023)   Exercise Vital Sign    Days of Exercise per Week: 5 days    Minutes of Exercise per Session: 30 min  Stress: No Stress Concern Present (03/23/2023)   [employer] of Occupational Health - Occupational Stress Questionnaire    Feeling of Stress : Not at all  Social Connections: Unknown (03/23/2023)   Social Connection and Isolation Panel [NHANES]    Frequency of Communication with Friends and Family: More than three times  a week    Frequency of Social Gatherings with Friends and Family: More than three times a week    Attends Religious Services: More than 4 times per year    Active Member of [employer] or Organizations: Not on file    Attends Banker Meetings: Not on file    Marital Status: Living with partner    MEDICATIONS:  Current Outpatient Medications  Medication Sig Dispense Refill   acetaminophen (TYLENOL) 500 MG tablet Take 500 mg by mouth every 6 (six) hours as needed.     Blood Pressure Monitor DEVI  Use as directed to check home blood pressure at least 4 times a week. Please notify primary provider of blood pressures greater than 140/90. Please send prescription to [facility].  Phone Number [phone] Fax Number [phone] 1 each 0   Cholecalciferol (VITAMIN D-1000 MAX ST) 25 MCG (1000 UT) tablet Take 1,000 Units by mouth daily.     Continuous Blood Gluc Receiver (DEXCOM G7 RECEIVER) DEVI Use to check blood sugar daily 1 each 0   Continuous Blood Gluc Sensor (DEXCOM G7 SENSOR) MISC Change every 10 days 9 each 2   cyanocobalamin 100 MCG tablet Take 1 tablet by mouth daily.     cycloSPORINE (RESTASIS) 0.05 % ophthalmic emulsion Place 1 drop into both eyes 2 (two) times daily.     Docusate Calcium (STOOL SOFTENER PO) Take by mouth.     gabapentin (NEURONTIN) 300 MG capsule TAKE 1 CAPSULE BY MOUTH EVERYDAY AT BEDTIME 90 capsule 0   Glucagon (GVOKE HYPOPEN 1-PACK) 1 MG/0.2ML SOAJ Inject 1 mg into the skin as needed (low blood sugar with impaired consciousness). 0.4 mL 2   glucose blood (ACCU-CHEK AVIVA PLUS) test strip 1 EACH BY OTHER ROUTE 4 (FOUR) TIMES DAILY 100 strip 13   Insulin Pen Needle (B-D UF III MINI PEN NEEDLES) 31G X 5 MM MISC USE 3 TIMES DAILY 300 each 2   latanoprost (XALATAN) 0.005 % ophthalmic solution SMARTSIG:1 Drop(s) In Eye(s) Every Evening     losartan-hydrochlorothiazide (HYZAAR) 100-25 MG tablet Take 1 tablet by mouth daily. 90 tablet 0   Multiple Vitamin  (MULTIVITAMIN WITH MINERALS) TABS tablet Take 1 tablet by mouth daily.     Multiple Vitamin (MULTIVITAMIN) capsule Take 1 capsule by mouth daily.     simvastatin (ZOCOR) 20 MG tablet TAKE 1 TABLET BY MOUTH EVERYDAY AT BEDTIME 90 tablet 3   tadalafil (CIALIS) 5 MG tablet Take 5 mg by mouth daily.     insulin aspart (NOVOLOG FLEXPEN) 100 UNIT/ML FlexPen 2-4 units with meals three times a day, maximum 12 untis/day. 15 mL 3   insulin glargine (LANTUS SOLOSTAR) 100 UNIT/ML Solostar Pen Inject 7 Units into the skin at bedtime. 15 mL 4   No current facility-administered medications for this visit.    PHYSICAL EXAM: Vitals:   10/17/23 0822  BP: 132/76  Pulse: 74  Resp: 20  SpO2: 96%  Weight: 162 lb 3.2 oz (73.6 kg)  Height: 5\' 11"  (1.803 m)    Body mass index is 22.62 kg/m.  Wt Readings from Last 3 Encounters:  10/17/23 162 lb 3.2 oz (73.6 kg)  10/11/23 160 lb 12.8 oz (72.9 kg)  07/17/23 167 lb (75.8 kg)    General: Well developed, well nourished male in no apparent distress.  HEENT: AT/[place], no external lesions.  Eyes: Conjunctiva clear and no icterus. Neck: Neck supple  Lungs: Respirations not labored Neurologic: Alert, oriented, normal speech Extremities / Skin: Dry. No sores or rashes noted.  Psychiatric: Does not appear depressed or anxious  Diabetic Foot Exam - Simple   No data filed     LABS Reviewed Lab Results  Component Value Date   HGBA1C 6.9 (A) 10/17/2023   HGBA1C 7.2 (H) 07/13/2023   HGBA1C 7.7 (H) 03/20/2023   No results found for: "FRUCTOSAMINE" Lab Results  Component Value Date   CHOL 141 07/13/2023   HDL 36.20 (L) 07/13/2023   LDLCALC 83 07/13/2023   LDLDIRECT 113.0 06/17/2022   TRIG 111.0 07/13/2023   CHOLHDL 4 07/13/2023   Lab Results  Component Value Date   MICRALBCREAT 2.6 07/17/2023   MICRALBCREAT 0.7 06/17/2022   Lab Results  Component Value Date   CREATININE 1.35 07/13/2023   Lab Results  Component Value Date   GFR 56.82 (L)  07/13/2023    Latest Reference Range & Units 07/17/23 09:39  IA-2 Antibody <5.4 U/mL <5.4  ZNT8 Antibodies <15 U/mL <10  Glutamic Acid Decarb Ab <5 IU/mL <5  C-Peptide 0.80 - 3.85 ng/mL 0.84    ASSESSMENT / PLAN  1. Type 1 diabetes mellitus with diabetic polyneuropathy (HCC)   2. 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    - Medications:  I) Decrease Lantus to 7 units daily. II) Adjust NovoLog 2-4 units with meals 3 times a day.  Okay to adjust NovoLog, take less during increased physical activity and eating less.  - Home glucose testing: DEXCOM G7 / check as needed.  - Discussed/ Gave Hypoglycemia treatment plan. Glucagon emergency kit / Gvoke ordered. Use glucose tablet as needed.   # Consult : not required at this time.   # Annual urine for microalbuminuria/ creatinine ratio, no microalbuminuria currently, continue ACE/ARB / losartan. Last  Lab Results  Component Value Date   MICRALBCREAT 2.6 07/17/2023    # Foot check nightly / neuropathy, continue gabapentin.   # Annual dilated diabetic eye exams. ?  Diabetic retinopathy.  - Diet: Eat reasonable portion sizes to promote a healthy weight - Life style / activity / exercise: discussed.   2. Blood pressure  -  BP Readings from Last 1 Encounters:  10/17/23 132/76    - Control is in target.  - No change in current plans.  3. Lipid status / Hyperlipidemia - Last  Lab Results  Component Value Date   LDLCALC 83 07/13/2023   - Continue simvastatin 20 mg daily.    Diagnoses and all orders for this visit:  Type 1 diabetes mellitus with diabetic polyneuropathy (HCC) -      POCT glycosylated hemoglobin (Hb A1C) -     insulin glargine (LANTUS SOLOSTAR) 100 UNIT/ML Solostar Pen; Inject 7 Units into the skin at bedtime. -     insulin aspart (NOVOLOG FLEXPEN) 100 UNIT/ML FlexPen; 2-4 units with meals three times a day, maximum 12 untis/day.  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   All questions answered and patient verbalized understanding of the plan.  Patrick Majour Frei, MD [facility] Endocrinology [facility] [address] [place], [address] [place], [place] [address] Phone # [phone]  At least part of this note was generated using voice recognition software. Inadvertent word errors may have occurred, which were not recognized during the proofreading process.

## 2023-10-17 NOTE — Patient Instructions (Addendum)
.   II) Decrease Lantus to 7 units daily. III) Adjust NovoLog 2-4 units with meals 3 times a day.  Okay to adjust NovoLog, take less during increased physical activity and eating less.

## 2023-10-18 ENCOUNTER — Other Ambulatory Visit: Payer: Self-pay

## 2023-10-18 ENCOUNTER — Other Ambulatory Visit: Payer: Self-pay | Admitting: Endocrinology

## 2023-10-18 ENCOUNTER — Encounter: Payer: Self-pay | Admitting: Endocrinology

## 2023-10-18 MED ORDER — INSULIN LISPRO (1 UNIT DIAL) 100 UNIT/ML (KWIKPEN): PEN_INJECTOR | SUBCUTANEOUS | 11 refills | Status: DC

## 2023-11-22 ENCOUNTER — Other Ambulatory Visit: Payer: Self-pay | Admitting: Family

## 2023-11-22 DIAGNOSIS — E1042 Type 1 diabetes mellitus with diabetic polyneuropathy: Secondary | ICD-10-CM

## 2023-11-24 NOTE — Telephone Encounter (Signed)
 Complete

## 2023-12-21 ENCOUNTER — Telehealth: Payer: Self-pay | Admitting: Radiation Oncology

## 2023-12-21 ENCOUNTER — Inpatient Hospital Stay
Admission: RE | Admit: 2023-12-21 | Discharge: 2023-12-21 | Disposition: A | Discharge status: Home / Self Care | Payer: Self-pay | Source: Ambulatory Visit | Attending: Radiation Oncology | Admitting: Radiation Oncology

## 2023-12-21 ENCOUNTER — Other Ambulatory Visit: Payer: Self-pay | Admitting: Radiation Oncology

## 2023-12-21 DIAGNOSIS — C61 Malignant neoplasm of prostate: Secondary | ICD-10-CM

## 2023-12-21 NOTE — Telephone Encounter (Signed)
1/30 @ 1:13 pm Left message with patient's friend for patient to call our office to be schedule for consult.

## 2023-12-21 NOTE — Telephone Encounter (Signed)
1/30 sent via stat fax request for PET/MRI images to be push to powershare from [facility] - Radiology Dept. Follow up call to [facility], spoke to [place] will push to powershare.  Follow up call to [facility], left voicemail.  Waiting on images.

## 2023-12-22 ENCOUNTER — Telehealth: Payer: Self-pay | Admitting: Radiation Oncology

## 2023-12-22 NOTE — Telephone Encounter (Signed)
1/31 @ 10:22 am Left voicemail on both patient's contact numbers for patient to call our office to be schedule for consult.

## 2023-12-25 ENCOUNTER — Encounter: Payer: Self-pay | Admitting: Radiation Oncology

## 2023-12-25 NOTE — Progress Notes (Signed)
GU Location of Tumor / Histology: Prostate Ca  Radical Prostatectomy 01/20/2023  PSA 0.59 on 12/19/2023 PSA 0.26 on 05/10/2023 PSA 0.24 on 04/26/2023 PSA 0.29 on 03/13/2023  [name] presented as referral from Dr. Hetty Ely.  Biopsies     Past/Anticipated interventions by urology, if any: NA  Past/Anticipated interventions by medical oncology, if any:   Dr. Hetty Ely   Weight changes, if any:  Yes, about 5-6 lbs.  IPSS:  5 SHIM:  5  Bowel/Bladder complaints, if any:  No  Nausea/Vomiting, if any: No  Pain issues, if any:  0/10  SAFETY ISSUES: Prior radiation? No Pacemaker/ICD? No Possible current pregnancy? Male Is the patient on methotrexate? No  Current Complaints / other details:

## 2024-01-01 NOTE — Progress Notes (Signed)
Radiation Oncology         [phone] ________________________________  Initial Outpatient Consultation  Name: Patrick [name] MRN: [id number]  Date: 01/02/2024  DOB: 1961/07/16  NW:GNFAOZHY, Salomon Fick, NP  Willaim Sheng*   REFERRING PHYSICIAN: Willaim Sheng*  DIAGNOSIS: 63 y.o. gentleman with a rising PSA of 0.59 s/p RALP 01/2023 for Stage pT3aN1, Gleason 3+4 prostate cancer.    ICD-10-CM   1. Malignant neoplasm of prostate (HCC)  C61       HISTORY OF PRESENT ILLNESS: Patrick [name] is a 63 y.o. male with a diagnosis of recurrent prostate cancer. He was initially referred to Dr. Earlene Plater for an elevated PSA of 12.18 and was subsequently diagnosed with Gleason 4+3 prostate cancer on biopsy performed 11/23/22. He opted to proceed with RALP on 01/20/23 under the care of Dr. Luane School. Surgical pathology confirmed pT3aN1, Gleason 3+4 prostatic adenocarcinoma with focal extraprostatic extension and 1 of 2 biopsied lymph nodes positive for metastatic carcinoma. All margins, as well as both seminal vesicles, were negative. His postoperative PSA was detectable at 0.29 so he was referred to Dr. Thana Ates to discuss treatment options. The recommendation was to proceed with ADT concurrent with salvage radiation but the patient was hesitant so a PSMA PET was ordered for restaging. The PSMA PET was performed on 04/26/23 and did not show any evidence of local recurrence or metastatic disease. After further discussion regarding treatment recommendations, the patient elected to continue to monitor the PSA and forego treatment.  His PSA continued to rise, at 0.45 in 07/2023 and most recently up to 0.59 on 12/19/23.  He met back with Dr. Kathlene November on 12/19/2023 and again discussed the recommendation for ADT and salvage radiotherapy.  Due to concerns regarding the effects of ADT on his type 1 diabetes, he is reluctant to proceed with ADT but interested in moving forward with salvage radiotherapy, closer to his  home in [place].  The patient reviewed the lab results with his urologist and medical oncologist and he has kindly been referred today for discussion of potential radiation treatment options.   PREVIOUS RADIATION THERAPY: No  PAST MEDICAL HISTORY:  Past Medical History:  Diagnosis Date   Cirrhosis (HCC)    Diabetes (HCC)    type 1   Diabetes mellitus without complication (HCC)    [employer] 02/21/2021   Elevated PSA    ETOH abuse       PAST SURGICAL HISTORY: Past Surgical History:  Procedure Laterality Date   EYE SURGERY N/A    [employer] 02/21/2021   MOUTH SURGERY     on gums   prostate biopsy     PROSTATECTOMY  01/20/2023   TONSILLECTOMY      FAMILY HISTORY:  Family History  Problem Relation Age of Onset   Heart disease Mother        bi-pass   Hypertension Mother    Cancer Brother        unsure type   Hypertension Father    Colon cancer Neg Hx     SOCIAL HISTORY:  Social History   Socioeconomic History   Marital status: Single    Spouse name: Not on file   Number of children: 1   Years of education: Not on file   Highest education level: Associate degree: occupational, Scientist, product/process development, or vocational program  Occupational History   Occupation: unemployed  Tobacco Use   Smoking status: Former    Current packs/day: 0.00    Types: Cigarettes    Quit  date: 07/15/1995    Years since quitting: 28.4    Passive exposure: Past   Smokeless tobacco: Never  Vaping Use   Vaping status: Never Used  Substance and Sexual Activity   Alcohol use: No    Comment: Not using for last 11 years   Drug use: No   Sexual activity: Not Currently  Other Topics Concern   Not on file  Social History Narrative   Not on file   Social Drivers of Health   Financial Resource Strain: Low Risk  (07/14/2021)   Overall Financial Resource Strain (CARDIA)    Difficulty of Paying Living Expenses: Not very hard  Food Insecurity: No Food Insecurity (01/02/2024)   Hunger Vital Sign     Worried About Running Out of Food in the Last Year: Never true    Ran Out of Food in the Last Year: Never true  Transportation Needs: No Transportation Needs (01/02/2024)   PRAPARE - Administrator, Civil Service (Medical): No    Lack of Transportation (Non-Medical): No  Physical Activity: Sufficiently Active (03/23/2023)   Exercise Vital Sign    Days of Exercise per Week: 5 days    Minutes of Exercise per Session: 30 min  Stress: No Stress Concern Present (03/23/2023)   [employer] of Occupational Health - Occupational Stress Questionnaire    Feeling of Stress : Not at all  Social Connections: Unknown (03/23/2023)   Social Connection and Isolation Panel [NHANES]    Frequency of Communication with Friends and Family: More than three times a week    Frequency of Social Gatherings with Friends and Family: More than three times a week    Attends Religious Services: More than 4 times per year    Active Member of [employer] or Organizations: Not on file    Attends Banker Meetings: Not on file    Marital Status: Living with partner  Intimate Partner Violence: Not At Risk (01/02/2024)   Humiliation, Afraid, Rape, and Kick questionnaire    Fear of Current or Ex-Partner: No    Emotionally Abused: No    Physically Abused: No    Sexually Abused: No    ALLERGIES: Patient has no known allergies.  MEDICATIONS:  Current Outpatient Medications  Medication Sig Dispense Refill   Cholecalciferol (VITAMIN D-1000 MAX ST) 25 MCG (1000 UT) tablet Take 1,000 Units by mouth daily.     cyanocobalamin 100 MCG tablet Take 1 tablet by mouth daily.     cycloSPORINE (RESTASIS) 0.05 % ophthalmic emulsion Place 1 drop into both eyes 2 (two) times daily.     Docusate Calcium (STOOL SOFTENER PO) Take by mouth.     gabapentin (NEURONTIN) 300 MG capsule TAKE 1 CAPSULE BY MOUTH EVERYDAY AT BEDTIME 90 capsule 0   insulin aspart (NOVOLOG FLEXPEN) 100 UNIT/ML FlexPen 2-4 units with meals three times  a day, maximum 12 untis/day. 15 mL 3   insulin glargine (LANTUS SOLOSTAR) 100 UNIT/ML Solostar Pen Inject 7 Units into the skin at bedtime. 15 mL 4   Insulin Pen Needle (B-D UF III MINI PEN NEEDLES) 31G X 5 MM MISC USE 3 TIMES DAILY 300 each 2   latanoprost (XALATAN) 0.005 % ophthalmic solution SMARTSIG:1 Drop(s) In Eye(s) Every Evening     losartan-hydrochlorothiazide (HYZAAR) 100-25 MG tablet Take 1 tablet by mouth daily. 90 tablet 0   Multiple Vitamin (MULTIVITAMIN WITH MINERALS) TABS tablet Take 1 tablet by mouth daily.     simvastatin (ZOCOR) 20 MG tablet TAKE  1 TABLET BY MOUTH EVERYDAY AT BEDTIME 90 tablet 3   tadalafil (CIALIS) 5 MG tablet Take 5 mg by mouth daily.     acetaminophen (TYLENOL) 500 MG tablet Take 500 mg by mouth every 6 (six) hours as needed.     Blood Pressure Monitor DEVI Use as directed to check home blood pressure at least 4 times a week. Please notify primary provider of blood pressures greater than 140/90. Please send prescription to [facility].  Phone Number [phone] Fax Number [phone] 0   Continuous Blood Gluc Receiver (DEXCOM G7 RECEIVER) DEVI Use to check blood sugar daily 1 each 0   Continuous Blood Gluc Sensor (DEXCOM G7 SENSOR) MISC Change every 10 days 9 each 2   Glucagon (GVOKE HYPOPEN 1-PACK) 1 MG/0.2ML SOAJ Inject 1 mg into the skin as needed (low blood sugar with impaired consciousness). 0.4 mL 2   No current facility-administered medications for this encounter.    REVIEW OF SYSTEMS:  On review of systems, the patient reports that he is doing well overall. He denies any chest pain, shortness of breath, cough, fevers, chills, night sweats, unintended weight changes. He denies any bowel disturbances, and denies abdominal pain, nausea or vomiting. He denies any new musculoskeletal or joint aches or pains. His IPSS was 5, indicating minimal urinary symptoms.  He reports excellent urinary continence.  His SHIM was 5, indicating he has severe  postoperative erectile dysfunction. A complete review of systems is obtained and is otherwise negative.    PHYSICAL EXAM:  Wt Readings from Last 3 Encounters:  01/02/24 158 lb 3.2 oz (71.8 kg)  10/17/23 162 lb 3.2 oz (73.6 kg)  10/11/23 160 lb 12.8 oz (72.9 kg)   Temp Readings from Last 3 Encounters:  01/02/24 (!) 97.1 F (36.2 C)  10/11/23 98.4 F (36.9 C) (Oral)  03/27/23 98 F (36.7 C)   BP Readings from Last 3 Encounters:  01/02/24 (!) 142/87  10/17/23 132/76  10/11/23 133/73   Pulse Readings from Last 3 Encounters:  01/02/24 (!) 108  10/17/23 74  10/11/23 97    /10  In general this is a well appearing African-American man in no acute distress. He's alert and oriented x4 and appropriate throughout the examination. Cardiopulmonary assessment is negative for acute distress, and he exhibits normal effort.     KPS = 100  100 - Normal; no complaints; no evidence of disease. 90   - Able to carry on normal activity; minor signs or symptoms of disease. 80   - Normal activity with effort; some signs or symptoms of disease. 26   - Cares for self; unable to carry on normal activity or to do active work. 60   - Requires occasional assistance, but is able to care for most of his personal needs. 50   - Requires considerable assistance and frequent medical care. 40   - Disabled; requires special care and assistance. 30   - Severely disabled; hospital admission is indicated although death not imminent. 20   - Very sick; hospital admission necessary; active supportive treatment necessary. 10   - Moribund; fatal processes progressing rapidly. 0     - Dead  Karnofsky DA, Abelmann WH, Craver LS and Burchenal [facility] [phone]) The use of the nitrogen mustards in the palliative treatment of carcinoma: with particular reference to bronchogenic carcinoma Cancer 1 634-56  LABORATORY DATA:  Lab Results  Component Value Date   WBC 4.2 04/07/2021   HGB 14.4 04/07/2021   HCT  43.2 04/07/2021    MCV 89 04/07/2021   PLT 181 04/07/2021   Lab Results  Component Value Date   NA 140 07/13/2023   K 4.0 07/13/2023   CL 100 07/13/2023   CO2 31 07/13/2023   Lab Results  Component Value Date   ALT 23 07/13/2023   AST 28 07/13/2023   ALKPHOS 62 07/13/2023   BILITOT 0.5 07/13/2023     RADIOGRAPHY: No results found.    IMPRESSION/PLAN: 1. 63 y.o. gentleman with a rising PSA of 0.59 s/p RALP 01/2023 for Stage pT3aN1, Gleason 3+4 prostate cancer Today we reviewed the findings and workup thus far.  We discussed the natural history of prostate cancer.  We reviewed the the implications of positive margins, extracapsular extension, and seminal vesicle involvement on the risk of prostate cancer recurrence. In his case, focal extraprostatic extension and a positive lymph node were present at the time of surgery and he has a detectable, rising PSA postoperatively. We reviewed some of the evidence suggesting an advantage for patients who undergo salvage radiotherapy in this setting in terms of disease control and overall survival.  We do recommend obtaining a current PSMA PET scan for disease restaging prior to finalizing our treatment recommendations but pending the PSMA PET scan does not show any unexpected findings to suggest diffuse metastatic disease, he would be eligible for a 7-1/2-week course of daily external beam salvage radiotherapy.  We discussed radiation treatment directed to the prostatic fossa and pelvic lymph nodes with regard to the logistics and delivery of external beam radiation treatment. We also detailed the role of ADT in the treatment of recurrent prostate cancer and outlined the associated side effects that could be expected with this therapy.  He was encouraged to ask questions that were answered to his stated satisfaction.  At the conclusion of our conversation, the patient is interested in moving forward with a PSMA PET scan for disease restaging and pending there is no evidence  of diffuse metastasis, he is in agreement to proceed with the recommended 7.5 week course of salvage external beam therapy to the prostate fossa and pelvic lymph nodes.  He is not interested in concurrent ADT due to concerns regarding the adverse effects on his blood sugar control.  He has freely signed written consent to proceed today in the office and a copy of this document will be placed in his medical record.  We will share our discussion with Dr. Earlene Plater and Dr. Thana Ates.  I will plan to call the patient with the results of the PSMA PET scan as soon as they are available, to finalize our treatment recommendations and proceed with treatment planning accordingly at that time. The patient appears to have a good understanding of his disease and our recommendations and is in agreement with the stated plan.  We enjoyed meeting him today and look forward to continuing to participate in his care.   We personally spent 70 minutes in this encounter including chart review, reviewing radiological studies, meeting face-to-face with the patient, entering orders and completing documentation.     Marguarite Arbour, PA-C    Margaretmary Dys, MD  [facility]  Radiation Oncology Direct Dial: [phone]  Fax: [phone] [email or web address]  Skype  [email or web address]   This document serves as a record of services personally performed by Margaretmary Dys, MD and Marcello Fennel, PA-C. It was created on their behalf by Mickie Bail, a trained medical scribe. The creation of this record is based on the scribe's personal  observations and the provider's statements to them. This document has been checked and approved by the attending provider.

## 2024-01-02 ENCOUNTER — Ambulatory Visit
Admission: RE | Admit: 2024-01-02 | Discharge: 2024-01-02 | Disposition: A | Discharge status: Home / Self Care | Payer: 59 | Source: Ambulatory Visit | Attending: Radiation Oncology | Admitting: Radiation Oncology

## 2024-01-02 ENCOUNTER — Encounter: Payer: Self-pay | Admitting: Radiation Oncology

## 2024-01-02 VITALS — BP 142/87 | HR 108 | Temp 97.1°F | Resp 18 | Ht 71.0 in | Wt 158.2 lb

## 2024-01-02 DIAGNOSIS — Z809 Family history of malignant neoplasm, unspecified: Secondary | ICD-10-CM | POA: Diagnosis not present

## 2024-01-02 DIAGNOSIS — K7460 Unspecified cirrhosis of liver: Secondary | ICD-10-CM | POA: Insufficient documentation

## 2024-01-02 DIAGNOSIS — C61 Malignant neoplasm of prostate: Secondary | ICD-10-CM

## 2024-01-02 DIAGNOSIS — Z87891 Personal history of nicotine dependence: Secondary | ICD-10-CM | POA: Insufficient documentation

## 2024-01-02 DIAGNOSIS — Z79899 Other long term (current) drug therapy: Secondary | ICD-10-CM | POA: Diagnosis not present

## 2024-01-02 DIAGNOSIS — E119 Type 2 diabetes mellitus without complications: Secondary | ICD-10-CM | POA: Insufficient documentation

## 2024-01-02 DIAGNOSIS — Z794 Long term (current) use of insulin: Secondary | ICD-10-CM | POA: Insufficient documentation

## 2024-01-02 HISTORY — DX: Elevated prostate specific antigen (PSA): R97.20

## 2024-01-02 NOTE — Progress Notes (Signed)
Introduced myself to the patient as the prostate nurse navigator.  No barriers to care identified at this time.  He is here to discuss his radiation treatment options.  He will proceed with PSMA PET and RN will work to coordinate appointment time and date followed by CT Simulation for salvage radiation pending results.  I gave him my business card and asked him to call me with questions or concerns.  Verbalized understanding.

## 2024-01-03 ENCOUNTER — Telehealth: Payer: Self-pay | Admitting: *Deleted

## 2024-01-03 NOTE — Telephone Encounter (Signed)
CALLED PATIENT TO INFORM OF PSMA PET SCAN FOR 01-11-24- ARRIVAL TIME- 1:45 PM @ [facility], PATIENT TO ARRIVE WELL- HYDRATED, SPOKE WITH PATIENT AND HE IS AWARE OF THIS APPT. AND THE INSTRUCTIONS

## 2024-01-11 ENCOUNTER — Ambulatory Visit: Payer: 59 | Admitting: Family

## 2024-01-11 ENCOUNTER — Encounter (HOSPITAL_COMMUNITY)
Admission: RE | Admit: 2024-01-11 | Discharge: 2024-01-11 | Disposition: A | Discharge status: Home / Self Care | Payer: 59 | Source: Ambulatory Visit | Attending: Urology | Admitting: Urology

## 2024-01-11 DIAGNOSIS — C61 Malignant neoplasm of prostate: Secondary | ICD-10-CM | POA: Diagnosis present

## 2024-01-11 MED ORDER — FLOTUFOLASTAT F 18 GALLIUM 296-5846 MBQ/ML IV SOLN
8.0000 | Freq: Once | INTRAVENOUS | Status: AC
  Administered 2024-01-11: 8.21 via INTRAVENOUS
  Filled 2024-01-11: qty 8

## 2024-01-12 NOTE — Progress Notes (Signed)
STAT read request placed for PSMA PET from 2/20 to ensure final treatment recommendations.

## 2024-01-15 ENCOUNTER — Telehealth: Payer: Self-pay | Admitting: *Deleted

## 2024-01-15 NOTE — Progress Notes (Signed)
 RN placed call to notify of PSMA PET results and date for upcoming CT Simulation.    No answer, voicemail box not set up.  Will attempt at a later time.

## 2024-01-15 NOTE — Telephone Encounter (Signed)
 CALLED PATIENT TO INFORM OF SIM APPT. FOR 01/18/24- ARRIVAL TIME- 1:45 PM @ [facility], LVM FOR A RETURN CALL

## 2024-01-15 NOTE — Progress Notes (Signed)
 RN spoke with patient and he is aware of PSMA PET results and upcoming CT Simulation.  Plan of care in progress.

## 2024-01-16 ENCOUNTER — Telehealth: Payer: Self-pay | Admitting: *Deleted

## 2024-01-16 NOTE — Telephone Encounter (Signed)
 Called patient to remind of sim appt. for 01-18-24- arrival time- 1:45 pm @ [facility], spoke with patient and he is aware of this appt.

## 2024-01-17 ENCOUNTER — Encounter: Payer: Self-pay | Admitting: Endocrinology

## 2024-01-17 ENCOUNTER — Ambulatory Visit (INDEPENDENT_AMBULATORY_CARE_PROVIDER_SITE_OTHER): Payer: 59 | Admitting: Endocrinology

## 2024-01-17 VITALS — BP 132/62 | HR 114 | Resp 20 | Ht 71.0 in | Wt 162.2 lb

## 2024-01-17 DIAGNOSIS — N189 Chronic kidney disease, unspecified: Secondary | ICD-10-CM

## 2024-01-17 DIAGNOSIS — E1022 Type 1 diabetes mellitus with diabetic chronic kidney disease: Secondary | ICD-10-CM | POA: Diagnosis not present

## 2024-01-17 DIAGNOSIS — E1042 Type 1 diabetes mellitus with diabetic polyneuropathy: Secondary | ICD-10-CM | POA: Diagnosis not present

## 2024-01-17 DIAGNOSIS — E1169 Type 2 diabetes mellitus with other specified complication: Secondary | ICD-10-CM

## 2024-01-17 LAB — POCT GLYCOSYLATED HEMOGLOBIN (HGB A1C): Hemoglobin A1C: 7.3 % — AB (ref 4.0–5.6)

## 2024-01-17 NOTE — Progress Notes (Signed)
 Outpatient Endocrinology Note [place] Patrick Corrigan, MD  01/17/24  Patient's Name: Patrick [name]    DOB: 07-25-1961    MRN: [id number]                                                    REASON OF VISIT: Follow up for type 1 diabetes mellitus  PCP: Patrick Fendt, NP  HISTORY OF PRESENT ILLNESS:   Patrick [name] is a 63 y.o. old male with past medical history listed below, is here for follow up of type 1 diabetes mellitus.   Pertinent Diabetes History: He was diagnosed with type 1 diabetes mellitus in 2015.  Patient has diabetes mellitus type 1 versus due to chronic pancreatitis / pancreatic diabetes.  He is being managed as type 1 diabetes mellitus.  He has history of diabetes ketoacidosis at the time of diagnosis.  Pancreatic imaging 2007 pancreatic calcification present.  In 2017 he was in the partial remission was off of insulin from 2018-2020.  He stopped drinking EtOH in 2010.  Insulin pump therapy was discussed and not considered in the past. Hemoglobin A1c mostly in the range of 6.3 to 8.1%.  He has negative type I autoimmune panel for IA 2, Zinc transporter 8 and GAD 65 antibody in August 2024, C-peptide 0.84.  Chronic Diabetes Complications : Retinopathy: no. Last ophthalmology exam was done on annually reportedly in 10/16/2023. Following with retina specialist,?  Diabetic retinopathy. Nephropathy: CKD stage III, on losartan. Peripheral neuropathy: yes, on gabapentin. Coronary artery disease: no Stroke: no  Relevant comorbidities and cardiovascular risk factors: Obesity: no Body mass index is 22.62 kg/m.  Hypertension: yes Hyperlipidemia. Yes, on simvastatin.  Current / Home Diabetic regimen includes: Lantus 7 units at bedtime. NovoLog 4-2-2 units with meals 3 times a day.  Prior diabetic medications:  Glycemic data:    CONTINUOUS GLUCOSE MONITORING SYSTEM (CGMS) INTERPRETATION: At today's visit, we reviewed CGM downloads. The full report is scanned in the media. Reviewing  the CGM trends, blood glucose are as follows:  Dexcom G7 CGM-  Sensor Download (Sensor download was reviewed and summarized below.) Dates: February 13 to January 17, 2024 for 14 days.  Glucose Management Indicator: 7.0%     Interpretation: There has been random hyperglycemia with blood sugar up to 200 range related to meals and a snack at different times of the day sometime with breakfast, sometime with supper and bedtime snack.  Rarely with afternoon meal or snack.  Blood sugar overnight and in between the meals are acceptable.  No concerning hypoglycemia.  Hypoglycemia: Patient has one hypoglycemic episodes. Patient has hypoglycemia awareness.  Factors modifying glucose control: 1.  Diabetic diet assessment: 3 meals a day breakfast, lunch and supper.  2.  Staying active or exercising: Walks regularly.  3.  Medication compliance: compliant all of the time.  Interval history Diabetes regimen as reviewed and noted above. CGM data as reviewed above.  He reports he had a couple of parties with voiding and holiday time with eating extra carbohydrate meals.  He has no other complaints today.  Hemoglobin A1c increased to 7.3% today.  REVIEW OF SYSTEMS As per history of present illness.   PAST MEDICAL HISTORY: Past Medical History:  Diagnosis Date   Cirrhosis (HCC)    Diabetes (HCC)    type 1   Diabetes mellitus without  complication (HCC)    Phreesia 02/21/2021   Elevated PSA    ETOH abuse     PAST SURGICAL HISTORY: Past Surgical History:  Procedure Laterality Date   EYE SURGERY N/A    Phreesia 02/21/2021   MOUTH SURGERY     on gums   prostate biopsy     PROSTATECTOMY  01/20/2023   TONSILLECTOMY      ALLERGIES: No Known Allergies  FAMILY HISTORY:  Family History  Problem Relation Age of Onset   Heart disease Mother        bi-pass   Hypertension Mother    Cancer Brother        unsure type   Hypertension Father    Colon cancer Neg Hx     SOCIAL  HISTORY: Social History   Socioeconomic History   Marital status: Single    Spouse name: Not on file   Number of children: 1   Years of education: Not on file   Highest education level: Associate degree: occupational, Scientist, product/process development, or vocational program  Occupational History   Occupation: unemployed  Tobacco Use   Smoking status: Former    Current packs/day: 0.00    Types: Cigarettes    Quit date: 07/15/1995    Years since quitting: 28.5    Passive exposure: Past   Smokeless tobacco: Never  Vaping Use   Vaping status: Never Used  Substance and Sexual Activity   Alcohol use: No    Comment: Not using for last 11 years   Drug use: No   Sexual activity: Not Currently  Other Topics Concern   Not on file  Social History Narrative   Not on file   Social Drivers of Health   Financial Resource Strain: Low Risk  (07/14/2021)   Overall Financial Resource Strain (CARDIA)    Difficulty of Paying Living Expenses: Not very hard  Food Insecurity: No Food Insecurity (01/02/2024)   Hunger Vital Sign    Worried About Running Out of Food in the Last Year: Never true    Ran Out of Food in the Last Year: Never true  Transportation Needs: No Transportation Needs (01/02/2024)   PRAPARE - Administrator, Civil Service (Medical): No    Lack of Transportation (Non-Medical): No  Physical Activity: Sufficiently Active (03/23/2023)   Exercise Vital Sign    Days of Exercise per Week: 5 days    Minutes of Exercise per Session: 30 min  Stress: No Stress Concern Present (03/23/2023)   [employer] of Occupational Health - Occupational Stress Questionnaire    Feeling of Stress : Not at all  Social Connections: Unknown (03/23/2023)   Social Connection and Isolation Panel [NHANES]    Frequency of Communication with Friends and Family: More than three times a week    Frequency of Social Gatherings with Friends and Family: More than three times a week    Attends Religious Services: More than 4  times per year    Active Member of [employer] or Organizations: Not on file    Attends Banker Meetings: Not on file    Marital Status: Living with partner    MEDICATIONS:  Current Outpatient Medications  Medication Sig Dispense Refill   acetaminophen (TYLENOL) 500 MG tablet Take 500 mg by mouth every 6 (six) hours as needed.     Blood Pressure Monitor DEVI Use as directed to check home blood pressure at least 4 times a week. Please notify primary provider of blood pressures greater than 140/90.  Please send prescription to [facility].  Phone Number [phone] Fax Number [phone] each 0   Cholecalciferol (VITAMIN D-1000 MAX ST) 25 MCG (1000 UT) tablet Take 1,000 Units by mouth daily.     Continuous Blood Gluc Receiver (DEXCOM G7 RECEIVER) DEVI Use to check blood sugar daily 1 each 0   Continuous Blood Gluc Sensor (DEXCOM G7 SENSOR) MISC Change every 10 days 9 each 2   cyanocobalamin 100 MCG tablet Take 1 tablet by mouth daily.     cycloSPORINE (RESTASIS) 0.05 % ophthalmic emulsion Place 1 drop into both eyes 2 (two) times daily.     Docusate Calcium (STOOL SOFTENER PO) Take by mouth.     gabapentin (NEURONTIN) 300 MG capsule TAKE 1 CAPSULE BY MOUTH EVERYDAY AT BEDTIME 90 capsule 0   Glucagon (GVOKE HYPOPEN 1-PACK) 1 MG/0.2ML SOAJ Inject 1 mg into the skin as needed (low blood sugar with impaired consciousness). 0.4 mL 2   insulin aspart (NOVOLOG FLEXPEN) 100 UNIT/ML FlexPen 2-4 units with meals three times a day, maximum 12 untis/day. 15 mL 3   insulin glargine (LANTUS SOLOSTAR) 100 UNIT/ML Solostar Pen Inject 7 Units into the skin at bedtime. 15 mL 4   Insulin Pen Needle (B-D UF III MINI PEN NEEDLES) 31G X 5 MM MISC USE 3 TIMES DAILY 300 each 2   latanoprost (XALATAN) 0.005 % ophthalmic solution SMARTSIG:1 Drop(s) In Eye(s) Every Evening     losartan-hydrochlorothiazide (HYZAAR) 100-25 MG tablet Take 1 tablet by mouth daily. 90 tablet 0   Multiple Vitamin (MULTIVITAMIN  WITH MINERALS) TABS tablet Take 1 tablet by mouth daily.     simvastatin (ZOCOR) 20 MG tablet TAKE 1 TABLET BY MOUTH EVERYDAY AT BEDTIME 90 tablet 3   tadalafil (CIALIS) 5 MG tablet Take 5 mg by mouth daily.     No current facility-administered medications for this visit.    PHYSICAL EXAM: Vitals:   01/17/24 0841  BP: 132/62  Pulse: (!) 114  Resp: 20  SpO2: 94%  Weight: 162 lb 3.2 oz (73.6 kg)  Height: 5\' 11"  (1.803 m)     Body mass index is 22.62 kg/m.  Wt Readings from Last 3 Encounters:  01/17/24 162 lb 3.2 oz (73.6 kg)  01/02/24 158 lb 3.2 oz (71.8 kg)  10/17/23 162 lb 3.2 oz (73.6 kg)    General: Well developed, well nourished male in no apparent distress.  HEENT: AT/[place], no external lesions.  Eyes: Conjunctiva clear and no icterus. Neck: Neck supple  Lungs: Respirations not labored Neurologic: Alert, oriented, normal speech Extremities / Skin: Dry.  Psychiatric: Does not appear depressed or anxious  Diabetic Foot Exam - Simple   No data filed     LABS Reviewed Lab Results  Component Value Date   HGBA1C 7.3 (A) 01/17/2024   HGBA1C 6.9 (A) 10/17/2023   HGBA1C 7.2 (H) 07/13/2023   No results found for: "FRUCTOSAMINE" Lab Results  Component Value Date   CHOL 141 07/13/2023   HDL 36.20 (L) 07/13/2023   LDLCALC 83 07/13/2023   LDLDIRECT 113.0 06/17/2022   TRIG 111.0 07/13/2023   CHOLHDL 4 07/13/2023   Lab Results  Component Value Date   MICRALBCREAT 2.6 07/17/2023   MICRALBCREAT 0.7 06/17/2022   Lab Results  Component Value Date   CREATININE 1.35 07/13/2023   Lab Results  Component Value Date   GFR 56.82 (L) 07/13/2023    Latest Reference Range & Units 07/17/23 09:39  IA-2 Antibody <5.4 U/mL <5.4  ZNT8 Antibodies <15  U/mL <10  Glutamic Acid Decarb Ab <5 IU/mL <5  C-Peptide 0.80 - 3.85 ng/mL 0.84    ASSESSMENT / PLAN  1. Type 1 diabetes mellitus with diabetic polyneuropathy (HCC)   2. 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    - Medications:  I) continue Lantus 7 units daily. II) Adjust NovoLog 2-4 units with meals 3 times a day.  Okay to adjust NovoLog, take less during increased physical activity and eating less.  He is generally taking 2 units for lunch and supper asked to take up to 4 unit and adjust in between 2 to 4 units to based on meal size and carbohydrate content.  Discussed about limiting portion and limiting carbohydrate content in the meal.  - Home glucose testing: DEXCOM G7 / check as needed.  - Discussed/ Gave Hypoglycemia treatment plan. Glucagon emergency kit / Gvoke ordered. Use glucose tablet as needed.   # Consult : not required at this time.   # Annual urine for microalbuminuria/ creatinine ratio, no microalbuminuria currently, continue ACE/ARB / losartan. Last  Lab Results  Component Value Date   MICRALBCREAT 2.6 07/17/2023    # Foot check nightly / neuropathy, continue gabapentin.   # Annual dilated diabetic eye exams. ?  Diabetic retinopathy.  - Diet: Eat reasonable portion sizes to promote a healthy weight - Life style / activity / exercise: discussed.   2. Blood pressure  -  BP Readings from Last 1 Encounters:  01/17/24 132/62    - Control is in target.  - No change in current plans.  3. Lipid status / Hyperlipidemia - Last  Lab Results  Component Value Date   LDLCALC 83 07/13/2023   - Continue simvastatin 20 mg daily.    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   All questions answered and patient verbalized understanding of the plan.  [place] Twylla Arceneaux, MD [facility] [address] [place], [address] [place], [place] [address] Phone # [phone]  At least part of this note was generated using voice recognition software. Inadvertent word errors may have occurred, which were not recognized during the proofreading process.

## 2024-01-18 ENCOUNTER — Ambulatory Visit
Admission: RE | Admit: 2024-01-18 | Discharge: 2024-01-18 | Disposition: A | Discharge status: Home / Self Care | Payer: 59 | Source: Ambulatory Visit | Attending: Radiation Oncology | Admitting: Radiation Oncology

## 2024-01-18 DIAGNOSIS — C61 Malignant neoplasm of prostate: Secondary | ICD-10-CM | POA: Insufficient documentation

## 2024-01-18 DIAGNOSIS — I700 Atherosclerosis of aorta: Secondary | ICD-10-CM | POA: Insufficient documentation

## 2024-01-18 DIAGNOSIS — I2510 Atherosclerotic heart disease of native coronary artery without angina pectoris: Secondary | ICD-10-CM | POA: Insufficient documentation

## 2024-01-18 NOTE — Progress Notes (Signed)
  Radiation Oncology         [phone] ________________________________  Name: Patrick [name] MRN: [id number]  Date: 01/18/2024  DOB: 10/22/1961  SIMULATION AND TREATMENT PLANNING NOTE    ICD-10-CM   1. Malignant neoplasm of prostate (HCC)  C61     2. Coronary atherosclerosis due to calcified coronary lesion  I25.10    I25.84     3. Aortic calcification (HCC)  I70.0       DIAGNOSIS:  63 y.o. gentleman with a rising PSA of 0.59 s/p RALP 01/2023 for Stage pT3aN1, Gleason 3+4 prostate cancer. Staging PSMA PET on 2/20 showed no metastatic disease but did show calcifications of the coronary arteries and aorta.  NARRATIVE:  The patient was brought to the CT Simulation planning suite.  Identity was confirmed.  All relevant records and images related to the planned course of therapy were reviewed.  The patient freely provided informed written consent to proceed with treatment after reviewing the details related to the planned course of therapy. The consent form was witnessed and verified by the simulation staff.  Then, the patient was set-up in a stable reproducible supine position for radiation therapy.  A vacuum lock pillow device was custom fabricated to position his legs in a reproducible immobilized position.  Then, I performed a urethrogram under sterile conditions to identify the prostatic bed.  CT images were obtained.  Surface markings were placed.  The CT images were loaded into the planning software.  Then the prostate bed target, pelvic lymph node target and avoidance structures including the rectum, bladder, bowel and hips were contoured.  Treatment planning then occurred.  The radiation prescription was entered and confirmed.  A total of one complex treatment devices were fabricated. I have requested : Intensity Modulated Radiotherapy (IMRT) is medically necessary for this case for the following reason:  Rectal sparing.Marland Kitchen  PLAN:  The patient will receive 45 Gy in 25 fractions of 1.8 Gy,  followed by a boost to the prostate bed to a total dose of 68.4 Gy with 13 additional fractions of 1.8 Gy.   ________________________________  Artist Pais Kathrynn Running, M.D.

## 2024-01-23 ENCOUNTER — Other Ambulatory Visit: Payer: Self-pay | Admitting: Family

## 2024-01-23 DIAGNOSIS — E1042 Type 1 diabetes mellitus with diabetic polyneuropathy: Secondary | ICD-10-CM

## 2024-01-23 NOTE — Telephone Encounter (Signed)
 Complete

## 2024-01-29 ENCOUNTER — Ambulatory Visit
Admission: RE | Admit: 2024-01-29 | Discharge: 2024-01-29 | Disposition: A | Discharge status: Home / Self Care | Payer: 59 | Source: Ambulatory Visit | Attending: Radiation Oncology | Admitting: Radiation Oncology

## 2024-01-29 DIAGNOSIS — C61 Malignant neoplasm of prostate: Secondary | ICD-10-CM | POA: Diagnosis present

## 2024-01-31 ENCOUNTER — Other Ambulatory Visit: Payer: Self-pay

## 2024-01-31 DIAGNOSIS — C61 Malignant neoplasm of prostate: Secondary | ICD-10-CM | POA: Diagnosis not present

## 2024-01-31 LAB — RAD ONC ARIA SESSION SUMMARY
Course Elapsed Days: 0
Plan Fractions Treated to Date: 1
Plan Prescribed Dose Per Fraction: 1.8 Gy
Plan Total Fractions Prescribed: 25
Plan Total Prescribed Dose: 45 Gy
Reference Point Dosage Given to Date: 1.8 Gy
Reference Point Session Dosage Given: 1.8 Gy
Session Number: 1

## 2024-02-01 ENCOUNTER — Other Ambulatory Visit: Payer: Self-pay

## 2024-02-01 ENCOUNTER — Ambulatory Visit
Admission: RE | Admit: 2024-02-01 | Discharge: 2024-02-01 | Disposition: A | Discharge status: Home / Self Care | Payer: 59 | Source: Ambulatory Visit | Attending: Radiation Oncology | Admitting: Radiation Oncology

## 2024-02-01 ENCOUNTER — Ambulatory Visit
Admission: RE | Admit: 2024-02-01 | Discharge: 2024-02-01 | Disposition: A | Discharge status: Home / Self Care | Source: Ambulatory Visit | Attending: Radiation Oncology | Admitting: Radiation Oncology

## 2024-02-01 DIAGNOSIS — C61 Malignant neoplasm of prostate: Secondary | ICD-10-CM | POA: Diagnosis not present

## 2024-02-01 LAB — RAD ONC ARIA SESSION SUMMARY
Course Elapsed Days: 1
Plan Fractions Treated to Date: 2
Plan Prescribed Dose Per Fraction: 1.8 Gy
Plan Total Fractions Prescribed: 25
Plan Total Prescribed Dose: 45 Gy
Reference Point Dosage Given to Date: 3.6 Gy
Reference Point Session Dosage Given: 1.8 Gy
Session Number: 2

## 2024-02-02 ENCOUNTER — Telehealth: Payer: Self-pay

## 2024-02-02 ENCOUNTER — Ambulatory Visit

## 2024-02-02 ENCOUNTER — Telehealth: Payer: Self-pay | Admitting: Endocrinology

## 2024-02-02 ENCOUNTER — Other Ambulatory Visit: Payer: Self-pay

## 2024-02-02 ENCOUNTER — Ambulatory Visit
Admission: RE | Admit: 2024-02-02 | Discharge: 2024-02-02 | Disposition: A | Discharge status: Home / Self Care | Payer: 59 | Source: Ambulatory Visit | Attending: Radiation Oncology | Admitting: Radiation Oncology

## 2024-02-02 DIAGNOSIS — C61 Malignant neoplasm of prostate: Secondary | ICD-10-CM | POA: Diagnosis not present

## 2024-02-02 LAB — RAD ONC ARIA SESSION SUMMARY
Course Elapsed Days: 2
Plan Fractions Treated to Date: 3
Plan Prescribed Dose Per Fraction: 1.8 Gy
Plan Total Fractions Prescribed: 25
Plan Total Prescribed Dose: 45 Gy
Reference Point Dosage Given to Date: 5.4 Gy
Reference Point Session Dosage Given: 1.8 Gy
Session Number: 3

## 2024-02-02 NOTE — Telephone Encounter (Signed)
 Sample  Device/Supplies: Dexcom G7 Quantity:1 [id number]   Dicie Beam

## 2024-02-02 NOTE — Telephone Encounter (Signed)
 Sample Provided. See other encounter for documentation.

## 2024-02-02 NOTE — Telephone Encounter (Signed)
 Patient came in to office today stating taht the 3 Dexcom G7 sensors that he has left are bad.  Patient states that he will not receive another shipment until the week of 02/12/2024.  Patient wants to know if he can get sample(s) to last until he gets his shipment.

## 2024-02-05 ENCOUNTER — Other Ambulatory Visit: Payer: Self-pay

## 2024-02-05 ENCOUNTER — Ambulatory Visit
Admission: RE | Admit: 2024-02-05 | Discharge: 2024-02-05 | Disposition: A | Discharge status: Home / Self Care | Payer: 59 | Source: Ambulatory Visit | Attending: Radiation Oncology | Admitting: Radiation Oncology

## 2024-02-05 DIAGNOSIS — C61 Malignant neoplasm of prostate: Secondary | ICD-10-CM | POA: Diagnosis not present

## 2024-02-05 LAB — RAD ONC ARIA SESSION SUMMARY
Course Elapsed Days: 5
Plan Fractions Treated to Date: 4
Plan Prescribed Dose Per Fraction: 1.8 Gy
Plan Total Fractions Prescribed: 25
Plan Total Prescribed Dose: 45 Gy
Reference Point Dosage Given to Date: 7.2 Gy
Reference Point Session Dosage Given: 1.8 Gy
Session Number: 4

## 2024-02-06 ENCOUNTER — Other Ambulatory Visit: Payer: Self-pay | Admitting: Family

## 2024-02-06 ENCOUNTER — Ambulatory Visit
Admission: RE | Admit: 2024-02-06 | Discharge: 2024-02-06 | Disposition: A | Discharge status: Home / Self Care | Payer: 59 | Source: Ambulatory Visit | Attending: Radiation Oncology | Admitting: Radiation Oncology

## 2024-02-06 ENCOUNTER — Other Ambulatory Visit: Payer: Self-pay

## 2024-02-06 DIAGNOSIS — I10 Essential (primary) hypertension: Secondary | ICD-10-CM

## 2024-02-06 DIAGNOSIS — C61 Malignant neoplasm of prostate: Secondary | ICD-10-CM | POA: Diagnosis not present

## 2024-02-06 LAB — RAD ONC ARIA SESSION SUMMARY
Course Elapsed Days: 6
Plan Fractions Treated to Date: 5
Plan Prescribed Dose Per Fraction: 1.8 Gy
Plan Total Fractions Prescribed: 25
Plan Total Prescribed Dose: 45 Gy
Reference Point Dosage Given to Date: 9 Gy
Reference Point Session Dosage Given: 1.8 Gy
Session Number: 5

## 2024-02-06 MED ORDER — LOSARTAN POTASSIUM-HCTZ 100-25 MG PO TABS: 1.0000 | ORAL_TABLET | Freq: Every day | ORAL | 0 refills | Status: DC

## 2024-02-07 ENCOUNTER — Ambulatory Visit
Admission: RE | Admit: 2024-02-07 | Discharge: 2024-02-07 | Disposition: A | Discharge status: Home / Self Care | Payer: 59 | Source: Ambulatory Visit | Attending: Radiation Oncology

## 2024-02-07 ENCOUNTER — Other Ambulatory Visit: Payer: Self-pay

## 2024-02-07 DIAGNOSIS — C61 Malignant neoplasm of prostate: Secondary | ICD-10-CM | POA: Diagnosis not present

## 2024-02-07 LAB — RAD ONC ARIA SESSION SUMMARY
Course Elapsed Days: 7
Plan Fractions Treated to Date: 6
Plan Prescribed Dose Per Fraction: 1.8 Gy
Plan Total Fractions Prescribed: 25
Plan Total Prescribed Dose: 45 Gy
Reference Point Dosage Given to Date: 10.8 Gy
Reference Point Session Dosage Given: 1.8 Gy
Session Number: 6

## 2024-02-08 ENCOUNTER — Ambulatory Visit
Admission: RE | Admit: 2024-02-08 | Discharge: 2024-02-08 | Disposition: A | Discharge status: Home / Self Care | Payer: 59 | Source: Ambulatory Visit | Attending: Radiation Oncology

## 2024-02-08 ENCOUNTER — Other Ambulatory Visit: Payer: Self-pay

## 2024-02-08 DIAGNOSIS — C61 Malignant neoplasm of prostate: Secondary | ICD-10-CM | POA: Diagnosis not present

## 2024-02-08 LAB — RAD ONC ARIA SESSION SUMMARY
Course Elapsed Days: 8
Plan Fractions Treated to Date: 7
Plan Prescribed Dose Per Fraction: 1.8 Gy
Plan Total Fractions Prescribed: 25
Plan Total Prescribed Dose: 45 Gy
Reference Point Dosage Given to Date: 12.6 Gy
Reference Point Session Dosage Given: 1.8 Gy
Session Number: 7

## 2024-02-09 ENCOUNTER — Ambulatory Visit
Admission: RE | Admit: 2024-02-09 | Discharge: 2024-02-09 | Disposition: A | Discharge status: Home / Self Care | Payer: 59 | Source: Ambulatory Visit | Attending: Radiation Oncology | Admitting: Radiation Oncology

## 2024-02-09 ENCOUNTER — Other Ambulatory Visit: Payer: Self-pay

## 2024-02-09 ENCOUNTER — Ambulatory Visit
Admission: RE | Admit: 2024-02-09 | Discharge: 2024-02-09 | Disposition: A | Discharge status: Home / Self Care | Source: Ambulatory Visit | Attending: Radiation Oncology | Admitting: Radiation Oncology

## 2024-02-09 DIAGNOSIS — C61 Malignant neoplasm of prostate: Secondary | ICD-10-CM | POA: Diagnosis not present

## 2024-02-09 LAB — RAD ONC ARIA SESSION SUMMARY
Course Elapsed Days: 9
Plan Fractions Treated to Date: 8
Plan Prescribed Dose Per Fraction: 1.8 Gy
Plan Total Fractions Prescribed: 25
Plan Total Prescribed Dose: 45 Gy
Reference Point Dosage Given to Date: 14.4 Gy
Reference Point Session Dosage Given: 1.8 Gy
Session Number: 8

## 2024-02-12 ENCOUNTER — Other Ambulatory Visit: Payer: Self-pay

## 2024-02-12 ENCOUNTER — Telehealth: Payer: Self-pay | Admitting: Endocrinology

## 2024-02-12 ENCOUNTER — Ambulatory Visit
Admission: RE | Admit: 2024-02-12 | Discharge: 2024-02-12 | Disposition: A | Discharge status: Home / Self Care | Payer: 59 | Source: Ambulatory Visit | Attending: Radiation Oncology

## 2024-02-12 DIAGNOSIS — C61 Malignant neoplasm of prostate: Secondary | ICD-10-CM | POA: Diagnosis not present

## 2024-02-12 LAB — RAD ONC ARIA SESSION SUMMARY
Course Elapsed Days: 12
Plan Fractions Treated to Date: 9
Plan Prescribed Dose Per Fraction: 1.8 Gy
Plan Total Fractions Prescribed: 25
Plan Total Prescribed Dose: 45 Gy
Reference Point Dosage Given to Date: 16.2 Gy
Reference Point Session Dosage Given: 1.8 Gy
Session Number: 9

## 2024-02-12 NOTE — Telephone Encounter (Signed)
 Samples of Dexcom G7 were given to the patient, quantity 1, Lot Number [id number]

## 2024-02-12 NOTE — Telephone Encounter (Signed)
 MEDICATION: Dexcom G7 Sensor Continuous Blood Gluc Sensor (DEXCOM G7 SENSOR) MISC  PHARMACY:   [facility] - [place], [place] - [address] [address] [place] (Ph: [phone])   HAS THE PATIENT CONTACTED THEIR PHARMACY?  Yes  IS THIS A 90 DAY SUPPLY : Yes  IS PATIENT OUT OF MEDICATION: Yes  IF NOT; HOW MUCH IS LEFT:   LAST APPOINTMENT DATE: @2 /26/2025  NEXT APPOINTMENT DATE:@5 /29/2025  DO WE HAVE YOUR PERMISSION TO LEAVE A DETAILED MESSAGE?: Yes OTHER COMMENTS:  Patient did pick up sample of sensor  **Let patient know to contact pharmacy at the end of the day to make sure medication is ready. **  ** Please notify patient to allow 48-72 hours to process**  **Encourage patient to contact the pharmacy for refills or they can request refills through [facility]**

## 2024-02-13 ENCOUNTER — Ambulatory Visit
Admission: RE | Admit: 2024-02-13 | Discharge: 2024-02-13 | Disposition: A | Discharge status: Home / Self Care | Payer: 59 | Source: Ambulatory Visit | Attending: Radiation Oncology

## 2024-02-13 ENCOUNTER — Other Ambulatory Visit: Payer: Self-pay

## 2024-02-13 DIAGNOSIS — C61 Malignant neoplasm of prostate: Secondary | ICD-10-CM | POA: Diagnosis not present

## 2024-02-13 LAB — RAD ONC ARIA SESSION SUMMARY
Course Elapsed Days: 13
Plan Fractions Treated to Date: 10
Plan Prescribed Dose Per Fraction: 1.8 Gy
Plan Total Fractions Prescribed: 25
Plan Total Prescribed Dose: 45 Gy
Reference Point Dosage Given to Date: 18 Gy
Reference Point Session Dosage Given: 1.8 Gy
Session Number: 10

## 2024-02-14 ENCOUNTER — Other Ambulatory Visit: Payer: Self-pay

## 2024-02-14 ENCOUNTER — Ambulatory Visit
Admission: RE | Admit: 2024-02-14 | Discharge: 2024-02-14 | Disposition: A | Discharge status: Home / Self Care | Payer: 59 | Source: Ambulatory Visit | Attending: Radiation Oncology | Admitting: Radiation Oncology

## 2024-02-14 DIAGNOSIS — C61 Malignant neoplasm of prostate: Secondary | ICD-10-CM | POA: Diagnosis not present

## 2024-02-14 LAB — RAD ONC ARIA SESSION SUMMARY
Course Elapsed Days: 14
Plan Fractions Treated to Date: 11
Plan Prescribed Dose Per Fraction: 1.8 Gy
Plan Total Fractions Prescribed: 25
Plan Total Prescribed Dose: 45 Gy
Reference Point Dosage Given to Date: 19.8 Gy
Reference Point Session Dosage Given: 1.8 Gy
Session Number: 11

## 2024-02-14 NOTE — Telephone Encounter (Signed)
 Order Submitted Via parachute: [facility] Client <Supplier Custom>  [facility] just now accepted order successfully. #[id number]

## 2024-02-14 NOTE — Telephone Encounter (Signed)
 Patient is calling to say that the pharmacy [facility] - [place], [place] - [address] [address], [place] (Ph: [phone]) Has not received the prescription for Dexcom G7 Sensor Continuous Blood Gluc Sensor (DEXCOM G7 SENSOR) MISC They told patient that they had received patient records, but not the actual prescription.

## 2024-02-15 ENCOUNTER — Other Ambulatory Visit: Payer: Self-pay

## 2024-02-15 ENCOUNTER — Ambulatory Visit
Admission: RE | Admit: 2024-02-15 | Discharge: 2024-02-15 | Disposition: A | Discharge status: Home / Self Care | Payer: 59 | Source: Ambulatory Visit | Attending: Radiation Oncology | Admitting: Radiation Oncology

## 2024-02-15 DIAGNOSIS — C61 Malignant neoplasm of prostate: Secondary | ICD-10-CM | POA: Diagnosis not present

## 2024-02-15 LAB — RAD ONC ARIA SESSION SUMMARY
Course Elapsed Days: 15
Plan Fractions Treated to Date: 12
Plan Prescribed Dose Per Fraction: 1.8 Gy
Plan Total Fractions Prescribed: 25
Plan Total Prescribed Dose: 45 Gy
Reference Point Dosage Given to Date: 21.6 Gy
Reference Point Session Dosage Given: 1.8 Gy
Session Number: 12

## 2024-02-16 ENCOUNTER — Ambulatory Visit
Admission: RE | Admit: 2024-02-16 | Discharge: 2024-02-16 | Disposition: A | Discharge status: Home / Self Care | Source: Ambulatory Visit | Attending: Radiation Oncology | Admitting: Radiation Oncology

## 2024-02-16 ENCOUNTER — Other Ambulatory Visit: Payer: Self-pay

## 2024-02-16 ENCOUNTER — Ambulatory Visit
Admission: RE | Admit: 2024-02-16 | Discharge: 2024-02-16 | Disposition: A | Discharge status: Home / Self Care | Payer: 59 | Source: Ambulatory Visit | Attending: Radiation Oncology | Admitting: Radiation Oncology

## 2024-02-16 DIAGNOSIS — C61 Malignant neoplasm of prostate: Secondary | ICD-10-CM | POA: Diagnosis not present

## 2024-02-16 LAB — RAD ONC ARIA SESSION SUMMARY
Course Elapsed Days: 16
Plan Fractions Treated to Date: 13
Plan Prescribed Dose Per Fraction: 1.8 Gy
Plan Total Fractions Prescribed: 25
Plan Total Prescribed Dose: 45 Gy
Reference Point Dosage Given to Date: 23.4 Gy
Reference Point Session Dosage Given: 1.8 Gy
Session Number: 13

## 2024-02-19 ENCOUNTER — Ambulatory Visit
Admission: RE | Admit: 2024-02-19 | Discharge: 2024-02-19 | Disposition: A | Discharge status: Home / Self Care | Payer: 59 | Source: Ambulatory Visit | Attending: Radiation Oncology | Admitting: Radiation Oncology

## 2024-02-19 ENCOUNTER — Other Ambulatory Visit: Payer: Self-pay

## 2024-02-19 DIAGNOSIS — C61 Malignant neoplasm of prostate: Secondary | ICD-10-CM | POA: Diagnosis not present

## 2024-02-19 LAB — RAD ONC ARIA SESSION SUMMARY
Course Elapsed Days: 19
Plan Fractions Treated to Date: 14
Plan Prescribed Dose Per Fraction: 1.8 Gy
Plan Total Fractions Prescribed: 25
Plan Total Prescribed Dose: 45 Gy
Reference Point Dosage Given to Date: 25.2 Gy
Reference Point Session Dosage Given: 1.8 Gy
Session Number: 14

## 2024-02-20 ENCOUNTER — Other Ambulatory Visit: Payer: Self-pay

## 2024-02-20 ENCOUNTER — Ambulatory Visit
Admission: RE | Admit: 2024-02-20 | Discharge: 2024-02-20 | Disposition: A | Discharge status: Home / Self Care | Payer: 59 | Source: Ambulatory Visit | Attending: Radiation Oncology | Admitting: Radiation Oncology

## 2024-02-20 DIAGNOSIS — C61 Malignant neoplasm of prostate: Secondary | ICD-10-CM | POA: Insufficient documentation

## 2024-02-20 LAB — RAD ONC ARIA SESSION SUMMARY
Course Elapsed Days: 20
Plan Fractions Treated to Date: 15
Plan Prescribed Dose Per Fraction: 1.8 Gy
Plan Total Fractions Prescribed: 25
Plan Total Prescribed Dose: 45 Gy
Reference Point Dosage Given to Date: 27 Gy
Reference Point Session Dosage Given: 1.8 Gy
Session Number: 15

## 2024-02-21 ENCOUNTER — Other Ambulatory Visit: Payer: Self-pay

## 2024-02-21 ENCOUNTER — Ambulatory Visit
Admission: RE | Admit: 2024-02-21 | Discharge: 2024-02-21 | Disposition: A | Discharge status: Home / Self Care | Payer: 59 | Source: Ambulatory Visit | Attending: Radiation Oncology | Admitting: Radiation Oncology

## 2024-02-21 DIAGNOSIS — C61 Malignant neoplasm of prostate: Secondary | ICD-10-CM | POA: Diagnosis not present

## 2024-02-21 LAB — RAD ONC ARIA SESSION SUMMARY
Course Elapsed Days: 21
Plan Fractions Treated to Date: 16
Plan Prescribed Dose Per Fraction: 1.8 Gy
Plan Total Fractions Prescribed: 25
Plan Total Prescribed Dose: 45 Gy
Reference Point Dosage Given to Date: 28.8 Gy
Reference Point Session Dosage Given: 1.8 Gy
Session Number: 16

## 2024-02-22 ENCOUNTER — Other Ambulatory Visit: Payer: Self-pay

## 2024-02-22 ENCOUNTER — Ambulatory Visit
Admission: RE | Admit: 2024-02-22 | Discharge: 2024-02-22 | Disposition: A | Discharge status: Home / Self Care | Payer: 59 | Source: Ambulatory Visit | Attending: Radiation Oncology

## 2024-02-22 DIAGNOSIS — C61 Malignant neoplasm of prostate: Secondary | ICD-10-CM | POA: Diagnosis not present

## 2024-02-22 LAB — RAD ONC ARIA SESSION SUMMARY
Course Elapsed Days: 22
Plan Fractions Treated to Date: 17
Plan Prescribed Dose Per Fraction: 1.8 Gy
Plan Total Fractions Prescribed: 25
Plan Total Prescribed Dose: 45 Gy
Reference Point Dosage Given to Date: 30.6 Gy
Reference Point Session Dosage Given: 1.8 Gy
Session Number: 17

## 2024-02-23 ENCOUNTER — Ambulatory Visit
Admission: RE | Admit: 2024-02-23 | Discharge: 2024-02-23 | Disposition: A | Discharge status: Home / Self Care | Source: Ambulatory Visit | Attending: Radiation Oncology | Admitting: Radiation Oncology

## 2024-02-23 ENCOUNTER — Ambulatory Visit
Admission: RE | Admit: 2024-02-23 | Discharge: 2024-02-23 | Disposition: A | Discharge status: Home / Self Care | Payer: 59 | Source: Ambulatory Visit | Attending: Radiation Oncology | Admitting: Radiation Oncology

## 2024-02-23 ENCOUNTER — Other Ambulatory Visit: Payer: Self-pay

## 2024-02-23 DIAGNOSIS — C61 Malignant neoplasm of prostate: Secondary | ICD-10-CM | POA: Diagnosis not present

## 2024-02-23 LAB — RAD ONC ARIA SESSION SUMMARY
Course Elapsed Days: 23
Plan Fractions Treated to Date: 18
Plan Prescribed Dose Per Fraction: 1.8 Gy
Plan Total Fractions Prescribed: 25
Plan Total Prescribed Dose: 45 Gy
Reference Point Dosage Given to Date: 32.4 Gy
Reference Point Session Dosage Given: 1.8 Gy
Session Number: 18

## 2024-02-26 ENCOUNTER — Other Ambulatory Visit: Payer: Self-pay

## 2024-02-26 ENCOUNTER — Ambulatory Visit
Admission: RE | Admit: 2024-02-26 | Discharge: 2024-02-26 | Disposition: A | Discharge status: Home / Self Care | Payer: 59 | Source: Ambulatory Visit | Attending: Radiation Oncology

## 2024-02-26 DIAGNOSIS — C61 Malignant neoplasm of prostate: Secondary | ICD-10-CM | POA: Diagnosis not present

## 2024-02-26 LAB — RAD ONC ARIA SESSION SUMMARY
Course Elapsed Days: 26
Plan Fractions Treated to Date: 19
Plan Prescribed Dose Per Fraction: 1.8 Gy
Plan Total Fractions Prescribed: 25
Plan Total Prescribed Dose: 45 Gy
Reference Point Dosage Given to Date: 34.2 Gy
Reference Point Session Dosage Given: 1.8 Gy
Session Number: 19

## 2024-02-27 ENCOUNTER — Other Ambulatory Visit: Payer: Self-pay

## 2024-02-27 ENCOUNTER — Telehealth: Payer: Self-pay | Admitting: Endocrinology

## 2024-02-27 ENCOUNTER — Ambulatory Visit
Admission: RE | Admit: 2024-02-27 | Discharge: 2024-02-27 | Disposition: A | Discharge status: Home / Self Care | Payer: 59 | Source: Ambulatory Visit | Attending: Radiation Oncology

## 2024-02-27 DIAGNOSIS — C61 Malignant neoplasm of prostate: Secondary | ICD-10-CM | POA: Diagnosis not present

## 2024-02-27 DIAGNOSIS — E1042 Type 1 diabetes mellitus with diabetic polyneuropathy: Secondary | ICD-10-CM

## 2024-02-27 LAB — RAD ONC ARIA SESSION SUMMARY
Course Elapsed Days: 27
Plan Fractions Treated to Date: 20
Plan Prescribed Dose Per Fraction: 1.8 Gy
Plan Total Fractions Prescribed: 25
Plan Total Prescribed Dose: 45 Gy
Reference Point Dosage Given to Date: 36 Gy
Reference Point Session Dosage Given: 1.8 Gy
Session Number: 20

## 2024-02-27 MED ORDER — DEXCOM G7 RECEIVER DEVI: 0 refills | Status: DC

## 2024-02-27 MED ORDER — DEXCOM G7 SENSOR MISC: 2 refills | Status: AC

## 2024-02-27 NOTE — Telephone Encounter (Signed)
 MEDICATION: Dexcom Receiver & Manual Blood Sugar Meter and supplies  PHARMACY:  [facility] [address]  HAS THE PATIENT CONTACTED THEIR PHARMACY?  yes  IS THIS A 90 DAY SUPPLY : unknown  IS PATIENT OUT OF MEDICATION: YES  IF NOT; HOW MUCH IS LEFT:   LAST APPOINTMENT DATE: @3 /24/2025  NEXT APPOINTMENT DATE:@5 /29/2025  DO WE HAVE YOUR PERMISSION TO LEAVE A DETAILED MESSAGE?:YES  - [phone]  OTHER COMMENTS: Patient DOES NOT CURRENTLY HAVE ANY WAY TO CHECK BLOOD SUGARSS   **Let patient know to contact pharmacy at the end of the day to make sure medication is ready. **  ** Please notify patient to allow 48-72 hours to process**  **Encourage patient to contact the pharmacy for refills or they can request refills through [facility]**

## 2024-02-27 NOTE — Telephone Encounter (Signed)
Sent to the pharmacy for patient

## 2024-02-28 ENCOUNTER — Ambulatory Visit
Admission: RE | Admit: 2024-02-28 | Discharge: 2024-02-28 | Disposition: A | Discharge status: Home / Self Care | Payer: 59 | Source: Ambulatory Visit | Attending: Radiation Oncology

## 2024-02-28 ENCOUNTER — Other Ambulatory Visit: Payer: Self-pay

## 2024-02-28 DIAGNOSIS — C61 Malignant neoplasm of prostate: Secondary | ICD-10-CM | POA: Diagnosis not present

## 2024-02-28 LAB — RAD ONC ARIA SESSION SUMMARY
Course Elapsed Days: 28
Plan Fractions Treated to Date: 21
Plan Prescribed Dose Per Fraction: 1.8 Gy
Plan Total Fractions Prescribed: 25
Plan Total Prescribed Dose: 45 Gy
Reference Point Dosage Given to Date: 37.8 Gy
Reference Point Session Dosage Given: 1.8 Gy
Session Number: 21

## 2024-02-29 ENCOUNTER — Other Ambulatory Visit: Payer: Self-pay

## 2024-02-29 ENCOUNTER — Ambulatory Visit
Admission: RE | Admit: 2024-02-29 | Discharge: 2024-02-29 | Discharge status: Home / Self Care | Payer: 59 | Source: Ambulatory Visit | Attending: Radiation Oncology

## 2024-02-29 DIAGNOSIS — C61 Malignant neoplasm of prostate: Secondary | ICD-10-CM | POA: Diagnosis not present

## 2024-02-29 LAB — RAD ONC ARIA SESSION SUMMARY
Course Elapsed Days: 29
Plan Fractions Treated to Date: 22
Plan Prescribed Dose Per Fraction: 1.8 Gy
Plan Total Fractions Prescribed: 25
Plan Total Prescribed Dose: 45 Gy
Reference Point Dosage Given to Date: 39.6 Gy
Reference Point Session Dosage Given: 1.8 Gy
Session Number: 22

## 2024-03-01 ENCOUNTER — Ambulatory Visit
Admission: RE | Admit: 2024-03-01 | Discharge: 2024-03-01 | Disposition: A | Discharge status: Home / Self Care | Payer: 59 | Source: Ambulatory Visit | Attending: Radiation Oncology | Admitting: Radiation Oncology

## 2024-03-01 ENCOUNTER — Other Ambulatory Visit: Payer: Self-pay

## 2024-03-01 ENCOUNTER — Ambulatory Visit
Admission: RE | Admit: 2024-03-01 | Discharge: 2024-03-01 | Disposition: A | Discharge status: Home / Self Care | Source: Ambulatory Visit | Attending: Radiation Oncology | Admitting: Radiation Oncology

## 2024-03-01 DIAGNOSIS — C61 Malignant neoplasm of prostate: Secondary | ICD-10-CM | POA: Diagnosis not present

## 2024-03-01 LAB — RAD ONC ARIA SESSION SUMMARY
Course Elapsed Days: 30
Plan Fractions Treated to Date: 23
Plan Prescribed Dose Per Fraction: 1.8 Gy
Plan Total Fractions Prescribed: 25
Plan Total Prescribed Dose: 45 Gy
Reference Point Dosage Given to Date: 41.4 Gy
Reference Point Session Dosage Given: 1.8 Gy
Session Number: 23

## 2024-03-04 ENCOUNTER — Other Ambulatory Visit: Payer: Self-pay

## 2024-03-04 ENCOUNTER — Ambulatory Visit
Admission: RE | Admit: 2024-03-04 | Discharge: 2024-03-04 | Disposition: A | Discharge status: Home / Self Care | Payer: 59 | Source: Ambulatory Visit | Attending: Radiation Oncology | Admitting: Radiation Oncology

## 2024-03-04 DIAGNOSIS — C61 Malignant neoplasm of prostate: Secondary | ICD-10-CM | POA: Diagnosis not present

## 2024-03-04 LAB — RAD ONC ARIA SESSION SUMMARY
Course Elapsed Days: 33
Plan Fractions Treated to Date: 24
Plan Prescribed Dose Per Fraction: 1.8 Gy
Plan Total Fractions Prescribed: 25
Plan Total Prescribed Dose: 45 Gy
Reference Point Dosage Given to Date: 43.2 Gy
Reference Point Session Dosage Given: 1.8 Gy
Session Number: 24

## 2024-03-05 ENCOUNTER — Other Ambulatory Visit: Payer: Self-pay

## 2024-03-05 ENCOUNTER — Ambulatory Visit
Admission: RE | Admit: 2024-03-05 | Discharge: 2024-03-05 | Disposition: A | Discharge status: Home / Self Care | Payer: 59 | Source: Ambulatory Visit | Attending: Radiation Oncology | Admitting: Radiation Oncology

## 2024-03-05 DIAGNOSIS — E1169 Type 2 diabetes mellitus with other specified complication: Secondary | ICD-10-CM

## 2024-03-05 DIAGNOSIS — E1042 Type 1 diabetes mellitus with diabetic polyneuropathy: Secondary | ICD-10-CM

## 2024-03-05 DIAGNOSIS — C61 Malignant neoplasm of prostate: Secondary | ICD-10-CM | POA: Diagnosis not present

## 2024-03-05 LAB — RAD ONC ARIA SESSION SUMMARY
Course Elapsed Days: 34
Plan Fractions Treated to Date: 25
Plan Prescribed Dose Per Fraction: 1.8 Gy
Plan Total Fractions Prescribed: 25
Plan Total Prescribed Dose: 45 Gy
Reference Point Dosage Given to Date: 45 Gy
Reference Point Session Dosage Given: 1.8 Gy
Session Number: 25

## 2024-03-05 MED ORDER — BD PEN NEEDLE MINI U/F 31G X 5 MM MISC: 2 refills | Status: DC

## 2024-03-06 ENCOUNTER — Ambulatory Visit
Admission: RE | Admit: 2024-03-06 | Discharge: 2024-03-06 | Disposition: A | Discharge status: Home / Self Care | Payer: 59 | Source: Ambulatory Visit | Attending: Radiation Oncology | Admitting: Radiation Oncology

## 2024-03-06 ENCOUNTER — Other Ambulatory Visit: Payer: Self-pay

## 2024-03-06 DIAGNOSIS — C61 Malignant neoplasm of prostate: Secondary | ICD-10-CM | POA: Diagnosis not present

## 2024-03-06 LAB — RAD ONC ARIA SESSION SUMMARY
Course Elapsed Days: 35
Plan Fractions Treated to Date: 1
Plan Prescribed Dose Per Fraction: 1.8 Gy
Plan Total Fractions Prescribed: 13
Plan Total Prescribed Dose: 23.4 Gy
Reference Point Dosage Given to Date: 1.8 Gy
Reference Point Session Dosage Given: 1.8 Gy
Session Number: 26

## 2024-03-07 ENCOUNTER — Ambulatory Visit
Admission: RE | Admit: 2024-03-07 | Discharge: 2024-03-07 | Disposition: A | Discharge status: Home / Self Care | Payer: 59 | Source: Ambulatory Visit | Attending: Radiation Oncology | Admitting: Radiation Oncology

## 2024-03-07 ENCOUNTER — Other Ambulatory Visit: Payer: Self-pay

## 2024-03-07 DIAGNOSIS — C61 Malignant neoplasm of prostate: Secondary | ICD-10-CM | POA: Diagnosis not present

## 2024-03-07 LAB — RAD ONC ARIA SESSION SUMMARY
Course Elapsed Days: 36
Plan Fractions Treated to Date: 2
Plan Prescribed Dose Per Fraction: 1.8 Gy
Plan Total Fractions Prescribed: 13
Plan Total Prescribed Dose: 23.4 Gy
Reference Point Dosage Given to Date: 3.6 Gy
Reference Point Session Dosage Given: 1.8 Gy
Session Number: 27

## 2024-03-08 ENCOUNTER — Other Ambulatory Visit: Payer: Self-pay

## 2024-03-08 ENCOUNTER — Ambulatory Visit
Admission: RE | Admit: 2024-03-08 | Discharge: 2024-03-08 | Disposition: A | Discharge status: Home / Self Care | Payer: 59 | Source: Ambulatory Visit | Attending: Radiation Oncology | Admitting: Radiation Oncology

## 2024-03-08 ENCOUNTER — Ambulatory Visit
Admission: RE | Admit: 2024-03-08 | Discharge: 2024-03-08 | Disposition: A | Discharge status: Home / Self Care | Source: Ambulatory Visit | Attending: Radiation Oncology | Admitting: Radiation Oncology

## 2024-03-08 DIAGNOSIS — C61 Malignant neoplasm of prostate: Secondary | ICD-10-CM | POA: Diagnosis not present

## 2024-03-08 LAB — RAD ONC ARIA SESSION SUMMARY
Course Elapsed Days: 37
Plan Fractions Treated to Date: 3
Plan Prescribed Dose Per Fraction: 1.8 Gy
Plan Total Fractions Prescribed: 13
Plan Total Prescribed Dose: 23.4 Gy
Reference Point Dosage Given to Date: 5.4 Gy
Reference Point Session Dosage Given: 1.8 Gy
Session Number: 28

## 2024-03-11 ENCOUNTER — Other Ambulatory Visit: Payer: Self-pay

## 2024-03-11 ENCOUNTER — Ambulatory Visit
Admission: RE | Admit: 2024-03-11 | Discharge: 2024-03-11 | Disposition: A | Discharge status: Home / Self Care | Payer: 59 | Source: Ambulatory Visit | Attending: Radiation Oncology | Admitting: Radiation Oncology

## 2024-03-11 DIAGNOSIS — C61 Malignant neoplasm of prostate: Secondary | ICD-10-CM | POA: Diagnosis not present

## 2024-03-11 LAB — RAD ONC ARIA SESSION SUMMARY
Course Elapsed Days: 40
Plan Fractions Treated to Date: 4
Plan Prescribed Dose Per Fraction: 1.8 Gy
Plan Total Fractions Prescribed: 13
Plan Total Prescribed Dose: 23.4 Gy
Reference Point Dosage Given to Date: 7.2 Gy
Reference Point Session Dosage Given: 1.8 Gy
Session Number: 29

## 2024-03-12 ENCOUNTER — Ambulatory Visit
Admission: RE | Admit: 2024-03-12 | Discharge: 2024-03-12 | Disposition: A | Discharge status: Home / Self Care | Payer: 59 | Source: Ambulatory Visit | Attending: Radiation Oncology | Admitting: Radiation Oncology

## 2024-03-12 ENCOUNTER — Other Ambulatory Visit: Payer: Self-pay

## 2024-03-12 DIAGNOSIS — C61 Malignant neoplasm of prostate: Secondary | ICD-10-CM | POA: Diagnosis not present

## 2024-03-12 LAB — RAD ONC ARIA SESSION SUMMARY
Course Elapsed Days: 41
Plan Fractions Treated to Date: 5
Plan Prescribed Dose Per Fraction: 1.8 Gy
Plan Total Fractions Prescribed: 13
Plan Total Prescribed Dose: 23.4 Gy
Reference Point Dosage Given to Date: 9 Gy
Reference Point Session Dosage Given: 1.8 Gy
Session Number: 30

## 2024-03-13 ENCOUNTER — Other Ambulatory Visit: Payer: Self-pay

## 2024-03-13 ENCOUNTER — Ambulatory Visit
Admission: RE | Admit: 2024-03-13 | Discharge: 2024-03-13 | Disposition: A | Discharge status: Home / Self Care | Payer: 59 | Source: Ambulatory Visit | Attending: Radiation Oncology | Admitting: Radiation Oncology

## 2024-03-13 DIAGNOSIS — C61 Malignant neoplasm of prostate: Secondary | ICD-10-CM | POA: Diagnosis not present

## 2024-03-13 LAB — RAD ONC ARIA SESSION SUMMARY
Course Elapsed Days: 42
Plan Fractions Treated to Date: 6
Plan Prescribed Dose Per Fraction: 1.8 Gy
Plan Total Fractions Prescribed: 13
Plan Total Prescribed Dose: 23.4 Gy
Reference Point Dosage Given to Date: 10.8 Gy
Reference Point Session Dosage Given: 1.8 Gy
Session Number: 31

## 2024-03-14 ENCOUNTER — Other Ambulatory Visit: Payer: Self-pay

## 2024-03-14 ENCOUNTER — Ambulatory Visit
Admission: RE | Admit: 2024-03-14 | Discharge: 2024-03-14 | Disposition: A | Discharge status: Home / Self Care | Payer: 59 | Source: Ambulatory Visit | Attending: Radiation Oncology

## 2024-03-14 DIAGNOSIS — C61 Malignant neoplasm of prostate: Secondary | ICD-10-CM | POA: Diagnosis not present

## 2024-03-14 LAB — RAD ONC ARIA SESSION SUMMARY
Course Elapsed Days: 43
Plan Fractions Treated to Date: 7
Plan Prescribed Dose Per Fraction: 1.8 Gy
Plan Total Fractions Prescribed: 13
Plan Total Prescribed Dose: 23.4 Gy
Reference Point Dosage Given to Date: 12.6 Gy
Reference Point Session Dosage Given: 1.8 Gy
Session Number: 32

## 2024-03-15 ENCOUNTER — Other Ambulatory Visit: Payer: Self-pay

## 2024-03-15 ENCOUNTER — Ambulatory Visit
Admission: RE | Admit: 2024-03-15 | Discharge: 2024-03-15 | Disposition: A | Discharge status: Home / Self Care | Source: Ambulatory Visit | Attending: Radiation Oncology | Admitting: Radiation Oncology

## 2024-03-15 ENCOUNTER — Ambulatory Visit
Admission: RE | Admit: 2024-03-15 | Discharge: 2024-03-15 | Disposition: A | Discharge status: Home / Self Care | Payer: 59 | Source: Ambulatory Visit | Attending: Radiation Oncology | Admitting: Radiation Oncology

## 2024-03-15 DIAGNOSIS — C61 Malignant neoplasm of prostate: Secondary | ICD-10-CM | POA: Diagnosis not present

## 2024-03-15 LAB — RAD ONC ARIA SESSION SUMMARY
Course Elapsed Days: 44
Plan Fractions Treated to Date: 8
Plan Prescribed Dose Per Fraction: 1.8 Gy
Plan Total Fractions Prescribed: 13
Plan Total Prescribed Dose: 23.4 Gy
Reference Point Dosage Given to Date: 14.4 Gy
Reference Point Session Dosage Given: 1.8 Gy
Session Number: 33

## 2024-03-18 ENCOUNTER — Ambulatory Visit
Admission: RE | Admit: 2024-03-18 | Discharge: 2024-03-18 | Disposition: A | Discharge status: Home / Self Care | Payer: 59 | Source: Ambulatory Visit | Attending: Radiation Oncology

## 2024-03-18 ENCOUNTER — Other Ambulatory Visit: Payer: Self-pay

## 2024-03-18 DIAGNOSIS — C61 Malignant neoplasm of prostate: Secondary | ICD-10-CM | POA: Diagnosis not present

## 2024-03-18 LAB — RAD ONC ARIA SESSION SUMMARY
Course Elapsed Days: 47
Plan Fractions Treated to Date: 9
Plan Prescribed Dose Per Fraction: 1.8 Gy
Plan Total Fractions Prescribed: 13
Plan Total Prescribed Dose: 23.4 Gy
Reference Point Dosage Given to Date: 16.2 Gy
Reference Point Session Dosage Given: 1.8 Gy
Session Number: 34

## 2024-03-19 ENCOUNTER — Other Ambulatory Visit: Payer: Self-pay

## 2024-03-19 ENCOUNTER — Ambulatory Visit
Admission: RE | Admit: 2024-03-19 | Discharge: 2024-03-19 | Disposition: A | Discharge status: Home / Self Care | Payer: 59 | Source: Ambulatory Visit | Attending: Radiation Oncology

## 2024-03-19 DIAGNOSIS — C61 Malignant neoplasm of prostate: Secondary | ICD-10-CM | POA: Diagnosis not present

## 2024-03-19 LAB — RAD ONC ARIA SESSION SUMMARY
Course Elapsed Days: 48
Plan Fractions Treated to Date: 10
Plan Prescribed Dose Per Fraction: 1.8 Gy
Plan Total Fractions Prescribed: 13
Plan Total Prescribed Dose: 23.4 Gy
Reference Point Dosage Given to Date: 18 Gy
Reference Point Session Dosage Given: 1.8 Gy
Session Number: 35

## 2024-03-20 ENCOUNTER — Ambulatory Visit
Admission: RE | Admit: 2024-03-20 | Discharge: 2024-03-20 | Disposition: A | Discharge status: Home / Self Care | Payer: 59 | Source: Ambulatory Visit | Attending: Radiation Oncology

## 2024-03-20 ENCOUNTER — Other Ambulatory Visit: Payer: Self-pay

## 2024-03-20 DIAGNOSIS — C61 Malignant neoplasm of prostate: Secondary | ICD-10-CM | POA: Diagnosis not present

## 2024-03-20 LAB — RAD ONC ARIA SESSION SUMMARY
Course Elapsed Days: 49
Plan Fractions Treated to Date: 11
Plan Prescribed Dose Per Fraction: 1.8 Gy
Plan Total Fractions Prescribed: 13
Plan Total Prescribed Dose: 23.4 Gy
Reference Point Dosage Given to Date: 19.8 Gy
Reference Point Session Dosage Given: 1.8 Gy
Session Number: 36

## 2024-03-21 ENCOUNTER — Ambulatory Visit
Admission: RE | Admit: 2024-03-21 | Discharge: 2024-03-21 | Disposition: A | Discharge status: Home / Self Care | Payer: 59 | Source: Ambulatory Visit | Attending: Radiation Oncology | Admitting: Radiation Oncology

## 2024-03-21 ENCOUNTER — Other Ambulatory Visit: Payer: Self-pay

## 2024-03-21 DIAGNOSIS — C61 Malignant neoplasm of prostate: Secondary | ICD-10-CM | POA: Diagnosis present

## 2024-03-21 LAB — RAD ONC ARIA SESSION SUMMARY
Course Elapsed Days: 50
Plan Fractions Treated to Date: 12
Plan Prescribed Dose Per Fraction: 1.8 Gy
Plan Total Fractions Prescribed: 13
Plan Total Prescribed Dose: 23.4 Gy
Reference Point Dosage Given to Date: 21.6 Gy
Reference Point Session Dosage Given: 1.8 Gy
Session Number: 37

## 2024-03-22 ENCOUNTER — Other Ambulatory Visit: Payer: Self-pay

## 2024-03-22 ENCOUNTER — Ambulatory Visit
Admission: RE | Admit: 2024-03-22 | Discharge: 2024-03-22 | Disposition: A | Discharge status: Home / Self Care | Payer: 59 | Source: Ambulatory Visit | Attending: Radiation Oncology

## 2024-03-22 ENCOUNTER — Ambulatory Visit
Admission: RE | Admit: 2024-03-22 | Discharge: 2024-03-22 | Disposition: A | Discharge status: Home / Self Care | Source: Ambulatory Visit | Attending: Radiation Oncology

## 2024-03-22 DIAGNOSIS — C61 Malignant neoplasm of prostate: Secondary | ICD-10-CM | POA: Diagnosis not present

## 2024-03-22 LAB — RAD ONC ARIA SESSION SUMMARY
Course Elapsed Days: 51
Plan Fractions Treated to Date: 13
Plan Prescribed Dose Per Fraction: 1.8 Gy
Plan Total Fractions Prescribed: 13
Plan Total Prescribed Dose: 23.4 Gy
Reference Point Dosage Given to Date: 23.4 Gy
Reference Point Session Dosage Given: 1.8 Gy
Session Number: 38

## 2024-03-25 ENCOUNTER — Telehealth: Payer: Self-pay | Admitting: Endocrinology

## 2024-03-25 NOTE — Radiation Completion Notes (Addendum)
  Radiation Oncology         [phone] ________________________________  Referring Physician: Mardeen Shackleton, M.D. Date of Service: 2024-03-25 Radiation Oncologist: Bartholome Ligas, M.D. [facility] [place]     RADIATION ONCOLOGY END OF TREATMENT NOTE     Diagnosis:  63 y.o. gentleman with biochemical recurrence of prostate cancer with a rising PSA of 0.59 s/p RALP 01/2023 for Stage pT3aN1, Gleason 3+4 prostate cancer.   Intent: Curative     ==========DELIVERED PLANS==========  First Treatment Date: 2024-01-31 Last Treatment Date: 2024-03-22   Plan Name: ProstBed_Pelv Site: Prostate Bed Technique: IMRT Mode: Photon Dose Per Fraction: 1.8 Gy Prescribed Dose (Delivered / Prescribed): 45 Gy / 45 Gy Prescribed Fxs (Delivered / Prescribed): 25 / 25   Plan Name: ProstBed_Bst Site: Prostate Bed Technique: IMRT Mode: Photon Dose Per Fraction: 1.8 Gy Prescribed Dose (Delivered / Prescribed): 23.4 Gy / 23.4 Gy Prescribed Fxs (Delivered / Prescribed): 13 / 13     ==========ON TREATMENT VISIT DATES========== 2024-02-01, 2024-02-09, 2024-02-16, 2024-02-23, 2024-03-01, 2024-03-08, 2024-03-15, 2024-03-22   See weekly On Treatment Notes in Epic for details in the Media tab (listed as Progress notes on the On Treatment Visit Dates listed above).  He tolerated the radiation treatments well with only modest fatigue and mild LUTS.  The patient will receive a call in about one month from the radiation oncology department. He will continue follow up with his urologist, Dr. Nolon Baxter, as well.  ------------------------------------------------   Kenith Payer, MD [facility]  Radiation Oncology Direct Dial : [phone]  Fax: [phone] [email or web address]  Skype  LinkedIn

## 2024-03-25 NOTE — Telephone Encounter (Signed)
 Patient's Dexcom failed and he has no sensors - he feels like radiation may have caused them to fail.  He is requesting an RX for a blood sugar meter, with lancets lancer and test strips to [facility] [address].  Call back # [phone]

## 2024-03-26 ENCOUNTER — Other Ambulatory Visit: Payer: Self-pay

## 2024-03-26 DIAGNOSIS — E1042 Type 1 diabetes mellitus with diabetic polyneuropathy: Secondary | ICD-10-CM

## 2024-03-26 MED ORDER — ACCU-CHEK SOFTCLIX LANCETS MISC: 12 refills | Status: AC

## 2024-03-26 MED ORDER — ACCU-CHEK GUIDE W/DEVICE KIT: PACK | 0 refills | Status: DC

## 2024-03-26 MED ORDER — ACCU-CHEK GUIDE TEST VI STRP: ORAL_STRIP | 12 refills | Status: AC

## 2024-03-28 NOTE — Progress Notes (Signed)
 Patient was a RadOnc Consult on 01/02/24 for his rising PSA of 0.59 s/p RALP 01/2023.  Patient proceed with treatment recommendations of 7.5 week course of salvage external beam therapy and had his final radiation treatment on 03/22/24.   Patient is scheduled for a post treatment nurse call on 04/23/24 and has a follow up with his urologist, Dr. Nolon Baxter on 5/22.   RN spoke with patient and provided education on post treatment PSA monitoring. All questions answered, no additional needs at this time.

## 2024-03-31 ENCOUNTER — Other Ambulatory Visit: Payer: Self-pay | Admitting: Endocrinology

## 2024-03-31 DIAGNOSIS — E1042 Type 1 diabetes mellitus with diabetic polyneuropathy: Secondary | ICD-10-CM

## 2024-04-18 ENCOUNTER — Ambulatory Visit (INDEPENDENT_AMBULATORY_CARE_PROVIDER_SITE_OTHER): Payer: 59 | Admitting: Endocrinology

## 2024-04-18 ENCOUNTER — Ambulatory Visit: Payer: Self-pay | Admitting: Endocrinology

## 2024-04-18 ENCOUNTER — Encounter: Payer: Self-pay | Admitting: Endocrinology

## 2024-04-18 VITALS — BP 112/62 | HR 97 | Resp 20 | Ht 71.0 in | Wt 157.0 lb

## 2024-04-18 DIAGNOSIS — E1042 Type 1 diabetes mellitus with diabetic polyneuropathy: Secondary | ICD-10-CM | POA: Diagnosis not present

## 2024-04-18 LAB — POCT GLYCOSYLATED HEMOGLOBIN (HGB A1C): Hemoglobin A1C: 7.1 % — AB (ref 4.0–5.6)

## 2024-04-18 MED ORDER — DEXCOM G7 RECEIVER DEVI
1.0000 | Status: AC
Start: 2024-04-18 — End: ?

## 2024-04-18 NOTE — Progress Notes (Signed)
 Outpatient Endocrinology Note Patrick Marissah Vandemark, MD  04/18/24  Patient's Name: Patrick [name]    DOB: 01-15-1961    MRN: [id number]                                                    REASON OF VISIT: Follow up for type 1 diabetes mellitus  PCP: Senaida Dama, NP  HISTORY OF PRESENT ILLNESS:   Patrick [name] is a 63 y.o. old male with past medical history listed below, is here for follow up of type 1 diabetes mellitus.   Pertinent Diabetes History: He was diagnosed with type 1 diabetes mellitus in 2015.  Patient has diabetes mellitus type 1 versus due to chronic pancreatitis / pancreatic diabetes.  He is being managed as type 1 diabetes mellitus.  He has history of diabetes ketoacidosis at the time of diagnosis.  Pancreatic imaging 2007 pancreatic calcification present.  In 2017 he was in the partial remission was off of insulin  from 2018-2020.  He stopped drinking EtOH in 2010.  Insulin  pump therapy was discussed and not considered in the past. Hemoglobin A1c mostly in the range of 6.3 to 8.1%.  He has negative type I autoimmune panel for IA 2, Zinc transporter 8 and GAD 65 antibody in August 2024, C-peptide 0.84.  Chronic Diabetes Complications : Retinopathy: no. Last ophthalmology exam was done on annually reportedly in 10/16/2023. Following with retina specialist,?  Diabetic retinopathy. Nephropathy: CKD stage III, on losartan . Peripheral neuropathy: yes, on gabapentin . Coronary artery disease: no Stroke: no  Relevant comorbidities and cardiovascular risk factors: Obesity: no Body mass index is 21.9 kg/m.  Hypertension: yes Hyperlipidemia. Yes, on simvastatin .  Current / Home Diabetic regimen includes: Lantus  7 units at bedtime. NovoLog  3-2-2 units with meals 3 times a day.  Prior diabetic medications:  Glycemic data:    CONTINUOUS GLUCOSE MONITORING SYSTEM (CGMS) INTERPRETATION: At today's visit, we reviewed CGM downloads. The full report is scanned in the media. Reviewing  the CGM trends, blood glucose are as follows:  Dexcom G7 CGM-  Sensor Download (Sensor download was reviewed and summarized below.) Dates: May 16 to May 29 , 2025 for 14 days. Sensor use is 85%  Glucose Management Indicator:6.6%     Interpretation: Mostly acceptable blood sugar with random mild hyperglycemia with blood sugar of 200s range postprandially related to snacks/high carb meal.  Blood sugar overnight and in between the meals are acceptable with rare mild hypoglycemia with blood sugar in 60s related to increased physical activity and exercise/walking.  Couple of times noted low blood sugar on CGM reading seems to be related to connectivity issue with the sensor.  Hypoglycemia: Patient has minor hypoglycemic episodes. Patient has hypoglycemia awareness.  Factors modifying glucose control: 1.  Diabetic diet assessment: 3 meals a day breakfast, lunch and supper.  2.  Staying active or exercising: Walks regularly, 3 times a week.  3.  Medication compliance: compliant all of the time.  Interval history CGM data as reviewed above, mostly acceptable blood sugar with random mild hyperglycemia related to high carb meal.  Hemoglobin A1c 7.1%.  Reports completed with radiation treatment for prostate cancer.  He admits to eating occasionally high carb snacks.  No other complaints today.  REVIEW OF SYSTEMS As per history of present illness.   PAST MEDICAL HISTORY: Past Medical History:  Diagnosis Date   Cirrhosis (HCC)    Diabetes (HCC)    type 1   Diabetes mellitus without complication (HCC)    [facility] 02/21/2021   Elevated PSA    ETOH abuse     PAST SURGICAL HISTORY: Past Surgical History:  Procedure Laterality Date   EYE SURGERY N/A    [employer]ia 02/21/2021   MOUTH SURGERY     on gums   prostate biopsy     PROSTATECTOMY  01/20/2023   TONSILLECTOMY      ALLERGIES: No Known Allergies  FAMILY HISTORY:  Family History  Problem Relation Age of Onset   Heart  disease Mother        bi-pass   Hypertension Mother    Cancer Brother        unsure type   Hypertension Father    Colon cancer Neg Hx     SOCIAL HISTORY: Social History   Socioeconomic History   Marital status: Single    Spouse name: Not on file   Number of children: 1   Years of education: Not on file   Highest education level: Associate degree: occupational, Scientist, product/process development, or vocational program  Occupational History   Occupation: unemployed  Tobacco Use   Smoking status: Former    Current packs/day: 0.00    Types: Cigarettes    Quit date: 07/15/1995    Years since quitting: 28.7    Passive exposure: Past   Smokeless tobacco: Never  Vaping Use   Vaping status: Never Used  Substance and Sexual Activity   Alcohol use: No    Comment: Not using for last 11 years   Drug use: No   Sexual activity: Not Currently  Other Topics Concern   Not on file  Social History Narrative   Not on file   Social Drivers of Health   Financial Resource Strain: Low Risk  (07/14/2021)   Overall Financial Resource Strain (CARDIA)    Difficulty of Paying Living Expenses: Not very hard  Food Insecurity: Low Risk  (04/11/2024)   Received from [facility]   Hunger Vital Sign    Worried About Running Out of Food in the Last Year: Never true    Ran Out of Food in the Last Year: Never true  Transportation Needs: No Transportation Needs (04/11/2024)   Received from [facility]    In the past 12 months, has lack of reliable transportation kept you from medical appointments, meetings, work or from getting things needed for daily living? : No  Physical Activity: Sufficiently Active (03/23/2023)   Exercise Vital Sign    Days of Exercise per Week: 5 days    Minutes of Exercise per Session: 30 min  Stress: No Stress Concern Present (03/23/2023)   [employer] of Occupational Health - Occupational Stress Questionnaire    Feeling of Stress : Not at all  Social Connections: Unknown  (03/23/2023)   Social Connection and Isolation Panel [NHANES]    Frequency of Communication with Friends and Family: More than three times a week    Frequency of Social Gatherings with Friends and Family: More than three times a week    Attends Religious Services: More than 4 times per year    Active Member of [employer] or Organizations: Not on file    Attends Banker Meetings: Not on file    Marital Status: Living with partner    MEDICATIONS:  Current Outpatient Medications  Medication Sig Dispense Refill   Accu-Chek Softclix Lancets  lancets Use to check blood glucose levels before meals and at bedtime 100 each 12   acetaminophen  (TYLENOL ) 500 MG tablet Take 500 mg by mouth every 6 (six) hours as needed.     Blood Glucose Monitoring Suppl (ACCU-CHEK GUIDE) w/Device KIT Use to check blood glucose levels before meals and at bedtime 1 kit 0   Blood Pressure Monitor DEVI Use as directed to check home blood pressure at least 4 times a week. Please notify primary provider of blood pressures greater than 140/90. Please send prescription to [facility].  Phone Number [phone] Fax Number [phone] 1 each 0   Cholecalciferol (VITAMIN D-1000 MAX ST) 25 MCG (1000 UT) tablet Take 1,000 Units by mouth daily.     Continuous Glucose Receiver (DEXCOM G7 RECEIVER) DEVI Use to check blood sugar daily 1 each 0   Continuous Glucose Receiver (DEXCOM G7 RECEIVER) DEVI 1 Device by Does not apply route continuous. Use with Dexcom G7 sensors to monitor blood glucose     Continuous Glucose Sensor (DEXCOM G7 SENSOR) MISC Change every 10 days 9 each 2   cyanocobalamin 100 MCG tablet Take 1 tablet by mouth daily.     cycloSPORINE (RESTASIS) 0.05 % ophthalmic emulsion Place 1 drop into both eyes 2 (two) times daily.     Docusate Calcium  (STOOL SOFTENER PO) Take by mouth.     gabapentin  (NEURONTIN ) 300 MG capsule TAKE 1 CAPSULE BY MOUTH EVERYDAY AT BEDTIME 90 capsule 0   Glucagon  (GVOKE HYPOPEN  1-PACK)  1 MG/0.2ML SOAJ Inject 1 mg into the skin as needed (low blood sugar with impaired consciousness). 0.4 mL 2   glucose blood (ACCU-CHEK GUIDE TEST) test strip Use to test blood glucose before meals and at bedtime 300 each 12   insulin  glargine (LANTUS  SOLOSTAR) 100 UNIT/ML Solostar Pen Inject 7 Units into the skin at bedtime. 15 mL 4   insulin  lispro (HUMALOG  KWIKPEN) 100 UNIT/ML KwikPen 2-4 units with meals three times a day, maximum 12 untis/day. 15 mL 11   Insulin  Pen Needle (B-D UF III MINI PEN NEEDLES) 31G X 5 MM MISC USE 3 TIMES DAILY 300 each 2   latanoprost (XALATAN) 0.005 % ophthalmic solution SMARTSIG:1 Drop(s) In Eye(s) Every Evening     losartan -hydrochlorothiazide (HYZAAR) 100-25 MG tablet TAKE 1 TABLET BY MOUTH EVERY DAY 90 tablet 0   Multiple Vitamin (MULTIVITAMIN WITH MINERALS) TABS tablet Take 1 tablet by mouth daily.     simvastatin  (ZOCOR ) 20 MG tablet TAKE 1 TABLET BY MOUTH EVERYDAY AT BEDTIME 90 tablet 3   losartan -hydrochlorothiazide (HYZAAR) 100-25 MG tablet Take 1 tablet by mouth daily. 90 tablet 0   tadalafil (CIALIS) 5 MG tablet Take 5 mg by mouth daily.     No current facility-administered medications for this visit.    PHYSICAL EXAM: Vitals:   04/18/24 0829  BP: 112/62  Pulse: 97  Resp: 20  SpO2: 97%  Weight: 157 lb (71.2 kg)  Height: 5\' 11"  (1.803 m)     Body mass index is 21.9 kg/m.  Wt Readings from Last 3 Encounters:  04/18/24 157 lb (71.2 kg)  01/17/24 162 lb 3.2 oz (73.6 kg)  01/02/24 158 lb 3.2 oz (71.8 kg)    General: Well developed, well nourished male in no apparent distress.  HEENT: AT/[place], no external lesions.  Eyes: Conjunctiva clear and no icterus. Neck: Neck supple  Lungs: Respirations not labored Neurologic: Alert, oriented, normal speech Extremities / Skin: Dry.  Psychiatric: Does not appear depressed or anxious  Diabetic Foot Exam - Simple   Simple Foot Form Diabetic Foot exam was performed with the following findings: Yes  04/18/2024  8:41 AM  Visual Inspection No deformities, no ulcerations, no other skin breakdown bilaterally: Yes Sensation Testing Intact to touch and monofilament testing bilaterally: Yes Pulse Check Posterior Tibialis and Dorsalis pulse intact bilaterally: Yes Comments     LABS Reviewed Lab Results  Component Value Date   HGBA1C 7.1 (A) 04/18/2024   HGBA1C 7.3 (A) 01/17/2024   HGBA1C 6.9 (A) 10/17/2023   No results found for: "FRUCTOSAMINE" Lab Results  Component Value Date   CHOL 141 07/13/2023   HDL 36.20 (L) 07/13/2023   LDLCALC 83 07/13/2023   LDLDIRECT 113.0 06/17/2022   TRIG 111.0 07/13/2023   CHOLHDL 4 07/13/2023   Lab Results  Component Value Date   MICRALBCREAT 2.6 07/17/2023   MICRALBCREAT 0.7 06/17/2022   Lab Results  Component Value Date   CREATININE 1.35 07/13/2023   Lab Results  Component Value Date   GFR 56.82 (L) 07/13/2023    Latest Reference Range & Units 07/17/23 09:39  IA-2 Antibody <5.4 U/mL <5.4  ZNT8 Antibodies <15 U/mL <10  Glutamic Acid Decarb Ab <5 IU/mL <5  C-Peptide 0.80 - 3.85 ng/mL 0.84    ASSESSMENT / PLAN  1. Type 1 diabetes mellitus with diabetic polyneuropathy (HCC)     Diabetes Mellitus type 1, complicated by peripheral neuropathy and CKD - Diabetic status / severity: Uncontrolled, improving.  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    - Medications:  I) continue Lantus  7 units daily. II) Adjust NovoLog  2-4 units with meals 3 times a day.  Okay to adjust NovoLog , take less during increased physical activity and eating less.  He is generally taking 2 units for lunch and supper asked to take up to 4 unit and adjust in between 2 to 4 units to based on meal size and carbohydrate content.  Discussed about limiting portion and limiting carbohydrate content in  the meal.  Advised to avoid snacks.  - Home glucose testing: DEXCOM G7 / check as needed.   - Discussed/ Gave Hypoglycemia treatment plan. Glucagon  emergency kit / Gvoke ordered. Use glucose tablet as needed.   # Consult : not required at this time.   # Annual urine for microalbuminuria/ creatinine ratio, no microalbuminuria currently, continue ACE/ARB / losartan . Last  Lab Results  Component Value Date   MICRALBCREAT 2.6 07/17/2023    # Foot check nightly / neuropathy, continue gabapentin .   # Annual dilated diabetic eye exams. ?  Diabetic retinopathy.  - Diet: Eat reasonable portion sizes to promote a healthy weight - Life style / activity / exercise: discussed.   2. Blood pressure  -  BP Readings from Last 1 Encounters:  04/18/24 112/62    - Control is in target.  - No change in current plans.  3. Lipid status / Hyperlipidemia - Last  Lab Results  Component Value Date   LDLCALC 83 07/13/2023   - Continue simvastatin  20 mg daily.    Diagnoses and all orders for this visit:  Type 1 diabetes mellitus with diabetic polyneuropathy (HCC) -     POCT glycosylated hemoglobin (Hb A1C) -     Continuous Glucose Receiver (DEXCOM G7 RECEIVER) DEVI; 1 Device by Does not apply route  continuous. Use with Dexcom G7 sensors to monitor blood glucose -     Basic Metabolic Panel Without GFR -     Hemoglobin A1c -     Microalbumin / creatinine urine ratio -     Lipid panel    DISPOSITION Follow up in clinic in 4 months suggested.  Labs prior to follow-up visit as ordered.   All questions answered and patient verbalized understanding of the plan.  Patrick Elayna Tobler, MD [facility] [address] [place], [address] [place], [place] [address] Phone # [phone]  At least part of this note was generated using voice recognition software. Inadvertent word errors may have occurred, which were not recognized during the proofreading process.

## 2024-04-19 NOTE — Progress Notes (Addendum)
  Radiation Oncology         [phone] ________________________________  Name: Patrick [name] MRN: [id number]  Date of Service: 04/19/2024  DOB: 04-Jan-1961  Post Treatment Telephone Note  Diagnosis:  biochemical recurrence of prostate cancer with a rising PSA of 0.59 s/p RALP 01/2023 for Stage [id number], Gleason 3+4 prostate cancer. (as documented in provider EOT note)  Pre Treatment IPSS Score: 5 (as documented in the provider consult note)  The patient was not available for call today. No voicemail option. Call marked complete.  Patient has a scheduled follow up visit with his urologist, Dr. Marguarite Shields, on 06/2024 for ongoing surveillance. He was counseled that PSA levels will be drawn in the urology office, and was reassured that additional time is expected to improve bowel and bladder symptoms. He was encouraged to call back with concerns or questions regarding radiation.    Avery Bodo, LPN

## 2024-04-21 ENCOUNTER — Other Ambulatory Visit: Payer: Self-pay | Admitting: Endocrinology

## 2024-04-21 DIAGNOSIS — E1042 Type 1 diabetes mellitus with diabetic polyneuropathy: Secondary | ICD-10-CM

## 2024-04-23 ENCOUNTER — Ambulatory Visit
Admission: RE | Admit: 2024-04-23 | Discharge: 2024-04-23 | Disposition: A | Discharge status: Home / Self Care | Source: Ambulatory Visit | Attending: Radiation Oncology | Admitting: Radiation Oncology

## 2024-04-23 DIAGNOSIS — C61 Malignant neoplasm of prostate: Secondary | ICD-10-CM | POA: Insufficient documentation

## 2024-05-05 ENCOUNTER — Other Ambulatory Visit: Payer: Self-pay | Admitting: Family

## 2024-05-05 DIAGNOSIS — I10 Essential (primary) hypertension: Secondary | ICD-10-CM

## 2024-05-06 NOTE — Telephone Encounter (Signed)
 Complete

## 2024-05-23 ENCOUNTER — Other Ambulatory Visit: Payer: Self-pay | Admitting: Endocrinology

## 2024-05-23 DIAGNOSIS — E1042 Type 1 diabetes mellitus with diabetic polyneuropathy: Secondary | ICD-10-CM

## 2024-05-23 NOTE — Telephone Encounter (Signed)
 Refill request complete

## 2024-06-05 ENCOUNTER — Other Ambulatory Visit: Payer: Self-pay | Admitting: Family

## 2024-06-05 DIAGNOSIS — E1042 Type 1 diabetes mellitus with diabetic polyneuropathy: Secondary | ICD-10-CM

## 2024-06-05 NOTE — Telephone Encounter (Signed)
 Complete

## 2024-06-23 ENCOUNTER — Other Ambulatory Visit: Payer: Self-pay | Admitting: Family

## 2024-06-23 DIAGNOSIS — E1042 Type 1 diabetes mellitus with diabetic polyneuropathy: Secondary | ICD-10-CM

## 2024-06-24 NOTE — Telephone Encounter (Signed)
 Complete

## 2024-08-15 ENCOUNTER — Other Ambulatory Visit

## 2024-08-19 ENCOUNTER — Ambulatory Visit (INDEPENDENT_AMBULATORY_CARE_PROVIDER_SITE_OTHER): Admitting: Endocrinology

## 2024-08-19 ENCOUNTER — Encounter: Payer: Self-pay | Admitting: Endocrinology

## 2024-08-19 ENCOUNTER — Ambulatory Visit: Payer: Self-pay | Admitting: Endocrinology

## 2024-08-19 VITALS — BP 132/82 | HR 92 | Resp 20 | Ht 71.0 in | Wt 159.6 lb

## 2024-08-19 DIAGNOSIS — Z23 Encounter for immunization: Secondary | ICD-10-CM | POA: Diagnosis not present

## 2024-08-19 DIAGNOSIS — E1042 Type 1 diabetes mellitus with diabetic polyneuropathy: Secondary | ICD-10-CM

## 2024-08-19 DIAGNOSIS — E782 Mixed hyperlipidemia: Secondary | ICD-10-CM

## 2024-08-19 LAB — BASIC METABOLIC PANEL WITH GFR
BUN: 22 mg/dL (ref 7–25)
CO2: 31 mmol/L (ref 20–32)
Calcium: 9.5 mg/dL (ref 8.6–10.3)
Chloride: 101 mmol/L (ref 98–110)
Creat: 1.29 mg/dL (ref 0.70–1.35)
Glucose, Bld: 143 mg/dL — ABNORMAL HIGH (ref 65–139)
Potassium: 4.3 mmol/L (ref 3.5–5.3)
Sodium: 139 mmol/L (ref 135–146)
eGFR: 63 mL/min/1.73m2 (ref >=60)

## 2024-08-19 LAB — LIPID PANEL
Cholesterol: 138 mg/dL (ref <200)
HDL: 39 mg/dL — ABNORMAL LOW (ref >=40)
LDL Cholesterol (Calc): 78 mg/dL
Non-HDL Cholesterol (Calc): 99 mg/dL (ref <130)
Total CHOL/HDL Ratio: 3.5 (calc) (ref <5.0)
Triglycerides: 131 mg/dL (ref <150)

## 2024-08-19 LAB — POCT GLYCOSYLATED HEMOGLOBIN (HGB A1C): Hemoglobin A1C: 7.4 % — AB (ref 4.0–5.6)

## 2024-08-19 LAB — MICROALBUMIN / CREATININE URINE RATIO
Creatinine, Urine: 211 mg/dL (ref 20–320)
Microalb Creat Ratio: 10 mg/g{creat} (ref <30)
Microalb, Ur: 2.2 mg/dL

## 2024-08-19 MED ORDER — DEXCOM G7 SENSOR MISC: Status: AC

## 2024-08-19 NOTE — Progress Notes (Signed)
 Outpatient Endocrinology Note [place] Alicianna Litchford, MD  08/19/24  Patient's Name: Patrick [name]    DOB: 21-Jan-1961    MRN: [id number]                                                    REASON OF VISIT: Follow up for type 1 diabetes mellitus  PCP: Lorren Greig PARAS, NP  HISTORY OF PRESENT ILLNESS:   Patrick [name] is a 63 y.o. old male with past medical history listed below, is here for follow up of type 1 diabetes mellitus.   Pertinent Diabetes History: He was diagnosed with type 1 diabetes mellitus in 2015.  Patient has diabetes mellitus type 1 versus due to chronic pancreatitis / pancreatic diabetes.  He is being managed as type 1 diabetes mellitus.  He has history of diabetes ketoacidosis at the time of diagnosis.  Pancreatic imaging 2007 pancreatic calcification present.  In 2017 he was in the partial remission was off of insulin  from 2018-2020.  He stopped drinking EtOH in 2010.  Insulin  pump therapy was discussed and not considered in the past. Hemoglobin A1c mostly in the range of 6.3 to 8.1%.  He has negative type I autoimmune panel for IA 2, Zinc transporter 8 and GAD 65 antibody in August 2024, C-peptide 0.84.  Chronic Diabetes Complications : Retinopathy: no. Last ophthalmology exam was done on annually reportedly in 10/16/2023. Following with retina specialist,?  Diabetic retinopathy. Nephropathy: CKD stage III, on losartan . Peripheral neuropathy: yes, on gabapentin . Coronary artery disease: no Stroke: no  Relevant comorbidities and cardiovascular risk factors: Obesity: no Body mass index is 22.26 kg/m.  Hypertension: yes Hyperlipidemia. Yes, on simvastatin .  Current / Home Diabetic regimen includes: Lantus  7 units at bedtime. NovoLog  2-4 units with meals 3 times a day.  Prior diabetic medications:  Glycemic data:    CONTINUOUS GLUCOSE MONITORING SYSTEM (CGMS) INTERPRETATION: At today's visit, we reviewed CGM downloads. The full report is scanned in the media. Reviewing  the CGM trends, blood glucose are as follows:  Dexcom G7 CGM-  Sensor Download (Sensor download was reviewed and summarized below.) Dates: September 16 to August 19, 2024 for 14 days. Sensor use is 78%  Glucose Management Indicator: 6.8%      Interpretation: Mostly acceptable blood sugar with frequent mild hyperglycemia in the afternoon around lunchtime and evening time related to meals and snacks.  Occasional blood sugar trending down and hypoglycemia seems to be related to sensor issue.  No concerning hypoglycemia.  Hypoglycemia: Patient has minor hypoglycemic episodes. Patient has hypoglycemia awareness.  Factors modifying glucose control: 1.  Diabetic diet assessment: 3 meals a day breakfast, lunch and supper.  2.  Staying active or exercising: Walks regularly, 3 times a week.  3.  Medication compliance: compliant all of the time.  Interval history CGM data as reviewed above.  Patient reports he had traveled to [facility] 1 week ago, had frequent eating any snacks.  Diabetes regimen as noted and reviewed above.  Hemoglobin A1c slightly worsening 7.4%.  He also reports he has not had enough exercise since last few weeks.  No other complaints today.  Patient wants to have flu shot in the clinic today.  REVIEW OF SYSTEMS As per history of present illness.   PAST MEDICAL HISTORY: Past Medical History:  Diagnosis Date   Cirrhosis (HCC)  Diabetes (HCC)    type 1   Diabetes mellitus without complication (HCC)    [employer] 02/21/2021   Elevated PSA    ETOH abuse     PAST SURGICAL HISTORY: Past Surgical History:  Procedure Laterality Date   EYE SURGERY N/A    [employer]ia 02/21/2021   MOUTH SURGERY     on gums   prostate biopsy     PROSTATECTOMY  01/20/2023   TONSILLECTOMY      ALLERGIES: No Known Allergies  FAMILY HISTORY:  Family History  Problem Relation Age of Onset   Heart disease Mother        bi-pass   Hypertension Mother    Cancer Brother         unsure type   Hypertension Father    Colon cancer Neg Hx     SOCIAL HISTORY: Social History   Socioeconomic History   Marital status: Single    Spouse name: Not on file   Number of children: 1   Years of education: Not on file   Highest education level: Associate degree: occupational, Scientist, product/process development, or vocational program  Occupational History   Occupation: unemployed  Tobacco Use   Smoking status: Former    Current packs/day: 0.00    Types: Cigarettes    Quit date: 07/15/1995    Years since quitting: 29.1    Passive exposure: Past   Smokeless tobacco: Never  Vaping Use   Vaping status: Never Used  Substance and Sexual Activity   Alcohol use: No    Comment: Not using for last 11 years   Drug use: No   Sexual activity: Not Currently  Other Topics Concern   Not on file  Social History Narrative   Not on file   Social Drivers of Health   Financial Resource Strain: Low Risk  (07/14/2021)   Overall Financial Resource Strain (CARDIA)    Difficulty of Paying Living Expenses: Not very hard  Food Insecurity: Low Risk  (04/11/2024)   Received from [facility]   Hunger Vital Sign    Within the past 12 months, you worried that your food would run out before you got money to buy more: Never true    Within the past 12 months, the food you bought just didn't last and you didn't have money to get more. : Never true  Transportation Needs: No Transportation Needs (04/11/2024)   Received from [facility]    In the past 12 months, has lack of reliable transportation kept you from medical appointments, meetings, work or from getting things needed for daily living? : No  Physical Activity: Sufficiently Active (03/23/2023)   Exercise Vital Sign    Days of Exercise per Week: 5 days    Minutes of Exercise per Session: 30 min  Stress: No Stress Concern Present (03/23/2023)   [employer] of Occupational Health - Occupational Stress Questionnaire    Feeling of Stress : Not  at all  Social Connections: Unknown (03/23/2023)   Social Connection and Isolation Panel    Frequency of Communication with Friends and Family: More than three times a week    Frequency of Social Gatherings with Friends and Family: More than three times a week    Attends Religious Services: More than 4 times per year    Active Member of [employer] or Organizations: Not on file    Attends Banker Meetings: Not on file    Marital Status: Living with partner    MEDICATIONS:  Current Outpatient Medications  Medication Sig Dispense Refill   Accu-Chek Softclix Lancets lancets Use to check blood glucose levels before meals and at bedtime 100 each 12   acetaminophen  (TYLENOL ) 500 MG tablet Take 500 mg by mouth every 6 (six) hours as needed.     Blood Glucose Monitoring Suppl (ACCU-CHEK GUIDE) w/Device KIT USE TO CHECK BLOOD GLUCOSE LEVELS BEFORE MEALS AND AT BEDTIME 1 kit 0   Blood Pressure Monitor DEVI Use as directed to check home blood pressure at least 4 times a week. Please notify primary provider of blood pressures greater than 140/90. Please send prescription to [facility].  Phone Number [phone] Fax Number [phone] 1 each 0   Cholecalciferol (VITAMIN D-1000 MAX ST) 25 MCG (1000 UT) tablet Take 1,000 Units by mouth daily.     Continuous Glucose Receiver (DEXCOM G7 RECEIVER) DEVI 1 Device by Does not apply route continuous. Use with Dexcom G7 sensors to monitor blood glucose     Continuous Glucose Receiver (DEXCOM G7 RECEIVER) DEVI USE TO CHECK BLOOD SUGAR DAILY 1 each 0   Continuous Glucose Sensor (DEXCOM G7 SENSOR) MISC Change every 10 days 9 each 2   cyanocobalamin 100 MCG tablet Take 1 tablet by mouth daily.     cycloSPORINE (RESTASIS) 0.05 % ophthalmic emulsion Place 1 drop into both eyes 2 (two) times daily.     Docusate Calcium  (STOOL SOFTENER PO) Take by mouth.     gabapentin  (NEURONTIN ) 300 MG capsule TAKE 1 CAPSULE BY MOUTH EVERYDAY AT BEDTIME 90 capsule 0    Glucagon  (GVOKE HYPOPEN  1-PACK) 1 MG/0.2ML SOAJ Inject 1 mg into the skin as needed (low blood sugar with impaired consciousness). 0.4 mL 2   glucose blood (ACCU-CHEK GUIDE TEST) test strip Use to test blood glucose before meals and at bedtime 300 each 12   insulin  glargine (LANTUS  SOLOSTAR) 100 UNIT/ML Solostar Pen Inject 7 Units into the skin at bedtime. 15 mL 4   insulin  lispro (HUMALOG  KWIKPEN) 100 UNIT/ML KwikPen 2-4 units with meals three times a day, maximum 12 untis/day. 15 mL 11   Insulin  Pen Needle (B-D UF III MINI PEN NEEDLES) 31G X 5 MM MISC USE 3 TIMES DAILY 300 each 2   latanoprost (XALATAN) 0.005 % ophthalmic solution SMARTSIG:1 Drop(s) In Eye(s) Every Evening     losartan -hydrochlorothiazide (HYZAAR) 100-25 MG tablet TAKE 1 TABLET BY MOUTH EVERY DAY 90 tablet 0   losartan -hydrochlorothiazide (HYZAAR) 100-25 MG tablet TAKE 1 TABLET BY MOUTH EVERY DAY 90 tablet 0   Multiple Vitamin (MULTIVITAMIN WITH MINERALS) TABS tablet Take 1 tablet by mouth daily.     simvastatin  (ZOCOR ) 20 MG tablet TAKE 1 TABLET BY MOUTH EVERYDAY AT BEDTIME 90 tablet 3   tadalafil (CIALIS) 5 MG tablet Take 5 mg by mouth daily.     No current facility-administered medications for this visit.    PHYSICAL EXAM: Vitals:   08/19/24 0823  BP: 132/82  Pulse: 92  Resp: 20  SpO2: 96%  Weight: 159 lb 9.6 oz (72.4 kg)  Height: 5' 11 (1.803 m)      Body mass index is 22.26 kg/m.  Wt Readings from Last 3 Encounters:  08/19/24 159 lb 9.6 oz (72.4 kg)  04/18/24 157 lb (71.2 kg)  01/17/24 162 lb 3.2 oz (73.6 kg)    General: Well developed, well nourished male in no apparent distress.  HEENT: AT/[place], no external lesions.  Eyes: Conjunctiva clear and no icterus. Neck: Neck supple  Lungs: Respirations not labored Neurologic:  Alert, oriented, normal speech Extremities / Skin: Dry.  Psychiatric: Does not appear depressed or anxious  Diabetic Foot Exam - Simple   No data filed     LABS Reviewed Lab  Results  Component Value Date   HGBA1C 7.4 (A) 08/19/2024   HGBA1C 7.1 (A) 04/18/2024   HGBA1C 7.3 (A) 01/17/2024   No results found for: FRUCTOSAMINE Lab Results  Component Value Date   CHOL 141 07/13/2023   HDL 36.20 (L) 07/13/2023   LDLCALC 83 07/13/2023   LDLDIRECT 113.0 06/17/2022   TRIG 111.0 07/13/2023   CHOLHDL 4 07/13/2023   Lab Results  Component Value Date   MICRALBCREAT 17 04/07/2021   Lab Results  Component Value Date   CREATININE 1.35 07/13/2023   Lab Results  Component Value Date   GFR 56.82 (L) 07/13/2023    Latest Reference Range & Units 07/17/23 09:39  IA-2 Antibody <5.4 U/mL <5.4  ZNT8 Antibodies <15 U/mL <10  Glutamic Acid Decarb Ab <5 IU/mL <5  C-Peptide 0.80 - 3.85 ng/mL 0.84    ASSESSMENT / PLAN  1. Type 1 diabetes mellitus with diabetic polyneuropathy (HCC)   2. Mixed hyperlipidemia     Diabetes Mellitus type 1, complicated by peripheral neuropathy and CKD - Diabetic status / severity: Uncontrolled, improving.  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    - Medications:  I) continue Lantus  7 units daily. II) Adjust NovoLog  2-4 units with meals 3 times a day.  Okay to adjust NovoLog , take less during increased physical activity and eating less.  He is generally taking 2 units for lunch and supper asked to take up to 4 unit and adjust in between 2 to 4 units to based on meal size and carbohydrate content.  Again discussed in detail today.  Discussed about limiting portion and limiting carbohydrate content in the meal.  Advised to avoid snacks.  - Home glucose testing: DEXCOM G7 / check as needed.   - Discussed/ Gave Hypoglycemia treatment plan. Glucagon  emergency kit / Gvoke ordered. Use glucose tablet as needed.   # Consult : not required at this time.   # Annual urine  for microalbuminuria/ creatinine ratio, no microalbuminuria currently, continue ACE/ARB / losartan .  Will check today. Last  Lab Results  Component Value Date   MICRALBCREAT 17 04/07/2021    # Foot check nightly / neuropathy, continue gabapentin .   # Annual dilated diabetic eye exams. ?  Diabetic retinopathy.  - Diet: Eat reasonable portion sizes to promote a healthy weight - Life style / activity / exercise: discussed.   2. Blood pressure  -  BP Readings from Last 1 Encounters:  08/19/24 132/82    - Control is in target.  - No change in current plans.  3. Lipid status / Hyperlipidemia - Last  Lab Results  Component Value Date   LDLCALC 83 07/13/2023   - Continue simvastatin  20 mg daily.    Diagnoses and all orders for this visit:  Type 1 diabetes mellitus with diabetic polyneuropathy (HCC) -     POCT glycosylated hemoglobin (Hb A1C) -     Microalbumin / creatinine urine ratio -     Lipid panel -     Basic metabolic panel with GFR  Mixed hyperlipidemia -     Lipid panel  DISPOSITION Follow up in clinic in 4 months suggested.  Labs today as ordered.  All questions answered and patient verbalized understanding of the plan.  [place] Aalayah Riles, MD [facility] Endocrinology [facility] [address] [place], [address] [place], [place] [address] Phone # [phone]  At least part of this note was generated using voice recognition software. Inadvertent word errors may have occurred, which were not recognized during the proofreading process.

## 2024-08-19 NOTE — Addendum Note (Signed)
 Addended by: ARELIA DIETRICH SAILOR on: 08/19/2024 10:11 AM   Modules accepted: Orders

## 2024-09-09 ENCOUNTER — Ambulatory Visit (INDEPENDENT_AMBULATORY_CARE_PROVIDER_SITE_OTHER)

## 2024-09-09 VITALS — Ht 71.0 in | Wt 159.0 lb

## 2024-09-09 DIAGNOSIS — Z0000 Encounter for general adult medical examination without abnormal findings: Secondary | ICD-10-CM

## 2024-09-09 NOTE — Progress Notes (Signed)
 Subjective:   Patrick [name] is a 63 y.o. who presents for a Medicare Wellness preventive visit.  As a reminder, Annual Wellness Visits don't include a physical exam, and some assessments may be limited, especially if this visit is performed virtually. We may recommend an in-person follow-up visit with your provider if needed.  Visit Complete: Virtual I connected with  [name] on 09/09/24 by a video and audio enabled telemedicine application and verified that I am speaking with the correct person using two identifiers.  Patient Location: Home  Provider Location: Home Office  I discussed the limitations of evaluation and management by telemedicine. The patient expressed understanding and agreed to proceed.  Vital Signs: Because this visit was a virtual/telehealth visit, some criteria may be missing or patient reported. Any vitals not documented were not able to be obtained and vitals that have been documented are patient reported.  Persons Participating in Visit: Patient.  AWV Questionnaire: No: Patient Medicare AWV questionnaire was not completed prior to this visit.  Cardiac Risk Factors include: advanced age (>1men, >58 women);hypertension;male gender;diabetes mellitus;Other (see comment), Risk factor comments: PSA     Objective:    Today's Vitals   09/09/24 1424  Weight: 159 lb (72.1 kg)  Height: 5' 11 (1.803 m)   Body mass index is 22.18 kg/m.     09/09/2024    2:33 PM 01/02/2024    8:17 AM 02/14/2019   11:06 AM 11/04/2018    4:25 PM 04/17/2014   12:35 PM 02/28/2014    4:24 PM 08/21/2012    9:00 PM  Advanced Directives  Does Patient Have a Medical Advance Directive? Yes Yes No  Yes  Patient has advance directive, copy not in chart  Patient does not have advance directive;Patient would not like information  Patient does not have advance directive   Type of Advance Directive Healthcare Power of [place];Living will Living will  Healthcare Power of Attorney      Copy of Healthcare Power of Attorney in Chart? No - copy requested   No - copy requested      Would patient like information on creating a medical advance directive?   No - Patient declined       Pre-existing out of facility DNR order (yellow form or pink MOST form)      No  No      Data saved with a previous flowsheet row definition    Current Medications (verified) Outpatient Encounter Medications as of 09/09/2024  Medication Sig   Accu-Chek Softclix Lancets lancets Use to check blood glucose levels before meals and at bedtime   acetaminophen  (TYLENOL ) 500 MG tablet Take 500 mg by mouth every 6 (six) hours as needed.   Blood Glucose Monitoring Suppl (ACCU-CHEK GUIDE) w/Device KIT USE TO CHECK BLOOD GLUCOSE LEVELS BEFORE MEALS AND AT BEDTIME   Blood Pressure Monitor DEVI Use as directed to check home blood pressure at least 4 times a week. Please notify primary provider of blood pressures greater than 140/90. Please send prescription to [facility].  Phone Number [phone] Fax Number [phone]   Cholecalciferol (VITAMIN D-1000 MAX ST) 25 MCG (1000 UT) tablet Take 1,000 Units by mouth daily.   Continuous Glucose Receiver (DEXCOM G7 RECEIVER) DEVI 1 Device by Does not apply route continuous. Use with Dexcom G7 sensors to monitor blood glucose   Continuous Glucose Receiver (DEXCOM G7 RECEIVER) DEVI USE TO CHECK BLOOD SUGAR DAILY   Continuous Glucose Sensor (DEXCOM G7 SENSOR) MISC Change every  10 days   Continuous Glucose Sensor (DEXCOM G7 SENSOR) MISC Use to check glucose continuously, change sensor every 10 days   cyanocobalamin 100 MCG tablet Take 1 tablet by mouth daily.   cycloSPORINE (RESTASIS) 0.05 % ophthalmic emulsion Place 1 drop into both eyes 2 (two) times daily.   Docusate Calcium  (STOOL SOFTENER PO) Take by mouth.   gabapentin  (NEURONTIN ) 300 MG capsule TAKE 1 CAPSULE BY MOUTH EVERYDAY AT BEDTIME   Glucagon  (GVOKE HYPOPEN  1-PACK) 1 MG/0.2ML SOAJ Inject 1 mg into the  skin as needed (low blood sugar with impaired consciousness).   glucose blood (ACCU-CHEK GUIDE TEST) test strip Use to test blood glucose before meals and at bedtime   insulin  glargine (LANTUS  SOLOSTAR) 100 UNIT/ML Solostar Pen Inject 7 Units into the skin at bedtime.   insulin  lispro (HUMALOG  KWIKPEN) 100 UNIT/ML KwikPen 2-4 units with meals three times a day, maximum 12 untis/day.   Insulin  Pen Needle (B-D UF III MINI PEN NEEDLES) 31G X 5 MM MISC USE 3 TIMES DAILY   latanoprost (XALATAN) 0.005 % ophthalmic solution SMARTSIG:1 Drop(s) In Eye(s) Every Evening   losartan -hydrochlorothiazide (HYZAAR) 100-25 MG tablet TAKE 1 TABLET BY MOUTH EVERY DAY   losartan -hydrochlorothiazide (HYZAAR) 100-25 MG tablet TAKE 1 TABLET BY MOUTH EVERY DAY   Multiple Vitamin (MULTIVITAMIN WITH MINERALS) TABS tablet Take 1 tablet by mouth daily.   simvastatin  (ZOCOR ) 20 MG tablet TAKE 1 TABLET BY MOUTH EVERYDAY AT BEDTIME   sildenafil  (VIAGRA ) 100 MG tablet Take 100 mg by mouth. (Patient not taking: Reported on 09/09/2024)   tadalafil (CIALIS) 5 MG tablet Take 5 mg by mouth daily. (Patient not taking: Reported on 09/09/2024)   No facility-administered encounter medications on file as of 09/09/2024.    Allergies (verified) Patient has no known allergies.   History: Past Medical History:  Diagnosis Date   Cirrhosis (HCC)    Diabetes (HCC)    type 1   Diabetes mellitus without complication (HCC)    [facility] 02/21/2021   Elevated PSA    ETOH abuse    Past Surgical History:  Procedure Laterality Date   EYE SURGERY N/A    [employer] 02/21/2021   MOUTH SURGERY     on gums   prostate biopsy     PROSTATECTOMY  01/20/2023   TONSILLECTOMY     Family History  Problem Relation Age of Onset   Heart disease Mother        bi-pass   Hypertension Mother    Cancer Brother        unsure type   Hypertension Father    Colon cancer Neg Hx    Social History   Socioeconomic History   Marital status: Significant  Other    Spouse name: Not on file   Number of children: 1   Years of education: Not on file   Highest education level: Associate degree: occupational, Scientist, product/process development, or vocational program  Occupational History   Occupation: unemployed   Occupation: RETIRED  Tobacco Use   Smoking status: Former    Current packs/day: 0.00    Types: Cigarettes    Quit date: 07/15/1995    Years since quitting: 29.1    Passive exposure: Past   Smokeless tobacco: Never  Vaping Use   Vaping status: Never Used  Substance and Sexual Activity   Alcohol use: No    Comment: Not using for last 11 years   Drug use: No   Sexual activity: Not Currently  Other Topics Concern   Not  on file  Social History Narrative   Lives with significant other   Social Drivers of Health   Financial Resource Strain: Low Risk  (09/09/2024)   Overall Financial Resource Strain (CARDIA)    Difficulty of Paying Living Expenses: Not hard at all  Food Insecurity: No Food Insecurity (09/09/2024)   Hunger Vital Sign    Worried About Running Out of Food in the Last Year: Never true    Ran Out of Food in the Last Year: Never true  Transportation Needs: No Transportation Needs (09/09/2024)   PRAPARE - Administrator, Civil Service (Medical): No    Lack of Transportation (Non-Medical): No  Physical Activity: Sufficiently Active (09/09/2024)   Exercise Vital Sign    Days of Exercise per Week: 7 days    Minutes of Exercise per Session: 60 min  Stress: No Stress Concern Present (09/09/2024)   [employer] of Occupational Health - Occupational Stress Questionnaire    Feeling of Stress: Not at all  Social Connections: Socially Integrated (09/09/2024)   Social Connection and Isolation Panel    Frequency of Communication with Friends and Family: More than three times a week    Frequency of Social Gatherings with Friends and Family: Once a week    Attends Religious Services: More than 4 times per year    Active Member of  [employer] or Organizations: Yes    Attends Engineer, structural: More than 4 times per year    Marital Status: Living with partner    Tobacco Counseling Counseling given: Not Answered    Clinical Intake:  Pre-visit preparation completed: Yes  Pain : No/denies pain     BMI - recorded: 22.18 Nutritional Status: BMI of 19-24  Normal Nutritional Risks: None Diabetes: Yes CBG done?: No Did pt. bring in CBG monitor from home?: No  Lab Results  Component Value Date   HGBA1C 7.4 (A) 08/19/2024   HGBA1C 7.1 (A) 04/18/2024   HGBA1C 7.3 (A) 01/17/2024     How often do you need to have someone help you when you read instructions, pamphlets, or other written materials from your doctor or pharmacy?: 1 - Never  Interpreter Needed?: No  Information entered by :: Maurice Ramseur, RMA   Activities of Daily Living     09/09/2024    2:28 PM  In your present state of health, do you have any difficulty performing the following activities:  Hearing? 0  Vision? 0  Difficulty concentrating or making decisions? 0  Walking or climbing stairs? 0  Dressing or bathing? 0  Doing errands, shopping? 0  Preparing Food and eating ? N  Using the Toilet? N  In the past six months, have you accidently leaked urine? N  Do you have problems with loss of bowel control? N  Managing your Medications? N  Managing your Finances? N  Housekeeping or managing your Housekeeping? N    Patient Care Team: Lorren Greig PARAS, NP as PCP - General (Nurse Practitioner) Vertell Pont, RN as Oncology Nurse Navigator Octavia Bruckner, MD as Consulting Physician (Ophthalmology)  I have updated your Care Teams any recent Medical Services you may have received from other providers in the past year.     Assessment:   This is a routine wellness examination for Braydn.  Hearing/Vision screen Hearing Screening - Comments:: Denies hearing difficulties   Vision Screening - Comments:: Wears eyeglasses/ Dr. KYM Octavia   Goals Addressed   None    Depression Screen  09/09/2024    2:35 PM 01/02/2024    8:33 AM 03/27/2023   10:04 AM 12/27/2022    9:03 AM 05/30/2022    9:03 AM 02/28/2022    8:33 AM 12/02/2021   10:44 AM  PHQ 2/9 Scores  PHQ - 2 Score 0 0 0 0 0 0 0  PHQ- 9 Score 0  0        Fall Risk     09/09/2024    2:34 PM 10/11/2023    9:54 AM 03/27/2023   10:04 AM 12/27/2022    9:03 AM 05/30/2022    9:02 AM  Fall Risk   Falls in the past year? 0 0 0 0 0  Number falls in past yr: 0 0 0 0 0  Injury with Fall? 0 0 0 0 0  Risk for fall due to : No Fall Risks No Fall Risks  No Fall Risks No Fall Risks  Follow up Falls evaluation completed   Falls evaluation completed Falls evaluation completed      Data saved with a previous flowsheet row definition    MEDICARE RISK AT HOME:  Medicare Risk at Home Any stairs in or around the home?: Yes If so, are there any without handrails?: No Home free of loose throw rugs in walkways, pet beds, electrical cords, etc?: Yes Adequate lighting in your home to reduce risk of falls?: Yes Life alert?: No Use of a cane, walker or w/c?: No Grab bars in the bathroom?: No Shower chair or bench in shower?: No Elevated toilet seat or a handicapped toilet?: No  TIMED UP AND GO:  Was the test performed?  No  Cognitive Function: Declined/Normal: No cognitive concerns noted by patient or family. Patient alert, oriented, able to answer questions appropriately and recall recent events. No signs of memory loss or confusion.        Immunizations Immunization History  Administered Date(s) Administered   INFLUENZA, HIGH DOSE SEASONAL PF 08/19/2024   Influenza Split 08/22/2012   Influenza,inj,Quad PF,6+ Mos 11/25/2015, 01/04/2017, 10/06/2017, 10/16/2018, 09/11/2019, 08/07/2020, 09/01/2021   Influenza,inj,Quad PF,6-35 Mos 09/15/2022   PFIZER(Purple Top)SARS-COV-2 Vaccination 02/24/2020, 10/02/2020, 02/20/2021   Pfizer Covid-19 Vaccine Bivalent Booster 76yrs & up  09/15/2022   Pneumococcal Polysaccharide-23 08/22/2012   Tdap 04/07/2021   Zoster Recombinant(Shingrix) 04/07/2021, 06/02/2021    Screening Tests Health Maintenance  Topic Date Due   Lung Cancer Screening  Never done   Pneumococcal Vaccine: 50+ Years (2 of 2 - PCV) 08/22/2013   Hepatitis B Vaccines 19-59 Average Risk (1 of 3 - Risk 3-dose series) Never done   OPHTHALMOLOGY EXAM  09/06/2023   Colonoscopy  04/17/2024   COVID-19 Vaccine (5 - 2025-26 season) 07/22/2024   HEMOGLOBIN A1C  02/16/2025   FOOT EXAM  04/18/2025   Diabetic kidney evaluation - eGFR measurement  08/19/2025   Diabetic kidney evaluation - Urine ACR  08/19/2025   Medicare Annual Wellness (AWV)  09/09/2025   DTaP/Tdap/Td (2 - Td or Tdap) 04/08/2031   Influenza Vaccine  Completed   Hepatitis C Screening  Completed   HIV Screening  Completed   Zoster Vaccines- Shingrix  Completed   HPV VACCINES  Aged Out   Meningococcal B Vaccine  Aged Out    Health Maintenance Items Addressed: Vaccines Due: Hep B, pneumonia, See Nurse Notes at the end of this note  Additional Screening:  Vision Screening: Recommended annual ophthalmology exams for early detection of glaucoma and other disorders of the eye. Is the patient up to  date with their annual eye exam?  Yes  Who is the provider or what is the name of the office in which the patient attends annual eye exams? Dr. KYM Hammers is up to date.  Dental Screening: Recommended annual dental exams for proper oral hygiene  Community Resource Referral / Chronic Care Management: CRR required this visit?  No   CCM required this visit?  No   Plan:    I have personally reviewed and noted the following in the patient's chart:   Medical and social history Use of alcohol, tobacco or illicit drugs  Current medications and supplements including opioid prescriptions. Patient is not currently taking opioid prescriptions. Functional ability and status Nutritional  status Physical activity Advanced directives List of other physicians Hospitalizations, surgeries, and ER visits in previous 12 months Vitals Screenings to include cognitive, depression, and falls Referrals and appointments  In addition, I have reviewed and discussed with patient certain preventive protocols, quality metrics, and best practice recommendations. A written personalized care plan for preventive services as well as general preventive health recommendations were provided to patient.   Master Touchet L Dalary Hollar, CMA   09/09/2024   After Visit Summary: (MyChart) Due to this being a telephonic visit, the after visit summary with patients personalized plan was offered to patient via MyChart   Notes: Patient is due for a Hep B vaccina and a pneumonia vaccine.  He is also due for a colonoscopy, which has not been ordered today.  Patient stated that he has had a recent diabetic eye exam.  I have sent a request for eye exam out today.  Patient also stated that he will call the office to get scheduled to see Amy.

## 2024-09-09 NOTE — Patient Instructions (Signed)
 [name],  Thank you for taking the time for your Medicare Wellness Visit. I appreciate your continued commitment to your health goals. Please review the care plan we discussed, and feel free to reach out if I can assist you further.  Medicare recommends these wellness visits once per year to help you and your care team stay ahead of potential health issues. These visits are designed to focus on prevention, allowing your provider to concentrate on managing your acute and chronic conditions during your regular appointments.  Please note that Annual Wellness Visits do not include a physical exam. Some assessments may be limited, especially if the visit was conducted virtually. If needed, we may recommend a separate in-person follow-up with your provider.  Ongoing Care Seeing your primary care provider every 3 to 6 months helps us  monitor your health and provide consistent, personalized care. Last office visit on 10/11/2023.  You are due for a Hep B vaccine and a pneumonia vaccine.  You are also due for a colonoscopy and order has to be placed.  Please call the office to get scheduled for a visit to see your provider, Patrick Drones, NP.    Referrals If a referral was made during today's visit and you haven't received any updates within two weeks, please contact the referred provider directly to check on the status.  Recommended Screenings:  Health Maintenance  Topic Date Due   Medicare Annual Wellness Visit  Never done   Screening for Lung Cancer  Never done   Pneumococcal Vaccine for age over 39 (2 of 2 - PCV) 08/22/2013   Hepatitis B Vaccine (1 of 3 - Risk 3-dose series) Never done   Eye exam for diabetics  09/06/2023   Colon Cancer Screening  04/17/2024   COVID-19 Vaccine (5 - 2025-26 season) 07/22/2024   Hemoglobin A1C  02/16/2025   Complete foot exam   04/18/2025   Yearly kidney function blood test for diabetes  08/19/2025   Yearly kidney health urinalysis for diabetes  08/19/2025    DTaP/Tdap/Td vaccine (2 - Td or Tdap) 04/08/2031   Flu Shot  Completed   Hepatitis C Screening  Completed   HIV Screening  Completed   Zoster (Shingles) Vaccine  Completed   HPV Vaccine  Aged Out   Meningitis B Vaccine  Aged Out       09/09/2024    2:33 PM  Advanced Directives  Does Patient Have a Medical Advance Directive? Yes  Type of Estate agent of [place];Living will  Copy of Healthcare Power of Attorney in Chart? No - copy requested   Advance Care Planning is important because it: Ensures you receive medical care that aligns with your values, goals, and preferences. Provides guidance to your family and loved ones, reducing the emotional burden of decision-making during critical moments.  Vision: Annual vision screenings are recommended for early detection of glaucoma, cataracts, and diabetic retinopathy. These exams can also reveal signs of chronic conditions such as diabetes and high blood pressure.  Dental: Annual dental screenings help detect early signs of oral cancer, gum disease, and other conditions linked to overall health, including heart disease and diabetes.  Please see the attached documents for additional preventive care recommendations.

## 2024-10-02 ENCOUNTER — Other Ambulatory Visit: Payer: Self-pay

## 2024-10-02 DIAGNOSIS — E1169 Type 2 diabetes mellitus with other specified complication: Secondary | ICD-10-CM

## 2024-10-02 DIAGNOSIS — E1042 Type 1 diabetes mellitus with diabetic polyneuropathy: Secondary | ICD-10-CM

## 2024-10-02 MED ORDER — BD PEN NEEDLE MINI U/F 31G X 5 MM MISC
2 refills | Status: DC
Start: 2024-10-02 — End: 2024-10-02

## 2024-10-02 MED ORDER — BD PEN NEEDLE MINI U/F 31G X 5 MM MISC: 2 refills | Status: AC

## 2024-11-06 ENCOUNTER — Other Ambulatory Visit: Payer: Self-pay | Admitting: Endocrinology

## 2024-11-06 DIAGNOSIS — E1042 Type 1 diabetes mellitus with diabetic polyneuropathy: Secondary | ICD-10-CM

## 2024-11-22 ENCOUNTER — Other Ambulatory Visit: Payer: Self-pay | Admitting: Endocrinology

## 2024-11-22 DIAGNOSIS — E1042 Type 1 diabetes mellitus with diabetic polyneuropathy: Secondary | ICD-10-CM

## 2024-11-30 ENCOUNTER — Other Ambulatory Visit: Payer: Self-pay | Admitting: Family

## 2024-11-30 DIAGNOSIS — E1042 Type 1 diabetes mellitus with diabetic polyneuropathy: Secondary | ICD-10-CM

## 2024-12-02 ENCOUNTER — Telehealth: Payer: Self-pay

## 2024-12-02 NOTE — Telephone Encounter (Signed)
 Schedule appointment. Last office visit 10/11/2023. During the interim report to the Emergency Department/Urgent Care/call 911 for immediate medical evaluation.

## 2024-12-02 NOTE — Telephone Encounter (Signed)
 Last refill 06/24/24 Last OV-10/11/2023 Please advise

## 2024-12-02 NOTE — Telephone Encounter (Signed)
 Attempted to reach patient concerning colonoscopy recall; unable to speak with patient; unable to leave a voicemail as mailbox is not set up; will attempt to reach patient at a later date/time;

## 2024-12-03 NOTE — Telephone Encounter (Signed)
 LVM to call the office back to schedule appt.

## 2024-12-03 NOTE — Telephone Encounter (Signed)
 Noted

## 2024-12-19 ENCOUNTER — Telehealth: Admitting: Endocrinology

## 2024-12-19 ENCOUNTER — Encounter: Payer: Self-pay | Admitting: Endocrinology

## 2024-12-19 VITALS — Ht 71.0 in | Wt 161.0 lb

## 2024-12-19 DIAGNOSIS — E782 Mixed hyperlipidemia: Secondary | ICD-10-CM

## 2024-12-19 DIAGNOSIS — E1042 Type 1 diabetes mellitus with diabetic polyneuropathy: Secondary | ICD-10-CM

## 2024-12-19 DIAGNOSIS — E1065 Type 1 diabetes mellitus with hyperglycemia: Secondary | ICD-10-CM

## 2024-12-19 MED ORDER — SIMVASTATIN 20 MG PO TABS: 20.0000 mg | ORAL_TABLET | Freq: Every day | ORAL | 3 refills | Status: AC

## 2024-12-19 NOTE — Progress Notes (Signed)
 "  Outpatient Endocrinology Note Margarethe Virgen, MD  12/19/24  Patient's Name: Patrick [name]    DOB: 05-04-1961    MRN: [id number]                                                    REASON OF VISIT: Follow up for type 1 diabetes mellitus  I connected with  Patrick [name] on 12/19/24 by a video enabled telemedicine application and verified that I am speaking with the correct person using two identifiers.   I discussed the limitations of evaluation and management by telemedicine. The patient expressed understanding and agreed to proceed.   Provider location : At office Patient location : At home  MyChart video visit due to weather.  PCP: Jaycee Greig PARAS, NP  HISTORY OF PRESENT ILLNESS:   Patrick [name] is a 64 y.o. old male with past medical history listed below, is here for follow up of type 1 diabetes mellitus.   Pertinent Diabetes History: He was diagnosed with type 1 diabetes mellitus in 2015.  Patient has diabetes mellitus type 1 versus due to chronic pancreatitis / pancreatic diabetes.  He is being managed as type 1 diabetes mellitus.  He has history of diabetes ketoacidosis at the time of diagnosis.  Pancreatic imaging 2007 pancreatic calcification present.  In 2017 he was in the partial remission was off of insulin  from 2018-2020.  He stopped drinking EtOH in 2010.  Insulin  pump therapy was discussed and not considered in the past. Hemoglobin A1c mostly in the range of 6.3 to 8.1%.  He has negative type I autoimmune panel for IA 2, Zinc transporter 8 and GAD 65 antibody in August 2024, C-peptide 0.84.  Chronic Diabetes Complications : Retinopathy: no. Last ophthalmology exam was done on annually reportedly in 10/16/2023. Following with retina specialist,?  Diabetic retinopathy. Nephropathy: CKD stage III, on losartan . Peripheral neuropathy: yes, on gabapentin . Coronary artery disease: no Stroke: no  Relevant comorbidities and cardiovascular risk factors: Obesity: no Body mass  index is 22.45 kg/m.  Hypertension: yes Hyperlipidemia. Yes, on simvastatin .  Current / Home Diabetic regimen includes: Lantus  7 units at bedtime. NovoLog  2-4 units with meals 3 times a day. Usually 3 units with meal.   Prior diabetic medications:  Glycemic data:    He uses Dexcom receiver, not able to remotely download glucose data.  He has been using Dexcom G7.  Dexcom data as of last visit as follows.  CONTINUOUS GLUCOSE MONITORING SYSTEM (CGMS) INTERPRETATION: At today's visit, we reviewed CGM downloads. The full report is scanned in the media. Reviewing the CGM trends, blood glucose are as follows:  Dexcom G7 CGM-  Sensor Download (Sensor download was reviewed and summarized below.) Dates: September 16 to August 19, 2024 for 14 days. Sensor use is 78%  Glucose Management Indicator: 6.8%      Interpretation: Mostly acceptable blood sugar with frequent mild hyperglycemia in the afternoon around lunchtime and evening time related to meals and snacks.  Occasional blood sugar trending down and hypoglycemia seems to be related to sensor issue.  No concerning hypoglycemia.  Hypoglycemia: Patient has minor hypoglycemic episodes. Patient has hypoglycemia awareness.  Factors modifying glucose control: 1.  Diabetic diet assessment: 3 meals a day breakfast, lunch and supper.  2.  Staying active or exercising: Walks regularly, 3 times a week.  3.  Medication compliance: compliant all of the time.  Interval history No glucose data to review.  He reports blood sugar this morning 119.  Mostly acceptable blood sugar.  Denies hypoglycemia.  Diabetes regimen as reviewed and noted above.  He usually takes NovoLog  3 units with meals and sometimes 2 units during increased physical activity and sometimes up to 4 units for increased amount of meal.  He reports during holiday time he had some high blood sugar and he has also not been exercising much.  No other complaints today.  This  is MyChart video visit due to weather condition.  REVIEW OF SYSTEMS As per history of present illness.   PAST MEDICAL HISTORY: Past Medical History:  Diagnosis Date   Cirrhosis (HCC)    Diabetes (HCC)    type 1   Diabetes mellitus without complication (HCC)    [facility]reesia 02/21/2021   Elevated PSA    ETOH abuse     PAST SURGICAL HISTORY: Past Surgical History:  Procedure Laterality Date   EYE SURGERY N/A    [facility]reesia 02/21/2021   MOUTH SURGERY     on gums   prostate biopsy     PROSTATECTOMY  01/20/2023   TONSILLECTOMY      ALLERGIES: No Known Allergies  FAMILY HISTORY:  Family History  Problem Relation Age of Onset   Heart disease Mother        bi-pass   Hypertension Mother    Cancer Brother        unsure type   Hypertension Father    Colon cancer Neg Hx     SOCIAL HISTORY: Social History   Socioeconomic History   Marital status: Significant Other    Spouse name: Not on file   Number of children: 1   Years of education: Not on file   Highest education level: Associate degree: occupational, scientist, product/process development, or vocational program  Occupational History   Occupation: unemployed   Occupation: RETIRED  Tobacco Use   Smoking status: Former    Current packs/day: 0.00    Types: Cigarettes    Quit date: 07/15/1995    Years since quitting: 29.4    Passive exposure: Past   Smokeless tobacco: Never  Vaping Use   Vaping status: Never Used  Substance and Sexual Activity   Alcohol use: No    Comment: Not using for last 11 years   Drug use: No   Sexual activity: Not Currently  Other Topics Concern   Not on file  Social History Narrative   Lives with significant other   Social Drivers of Health   Tobacco Use: Medium Risk (12/19/2024)   Patient History    Smoking Tobacco Use: Former    Smokeless Tobacco Use: Never    Passive Exposure: Past  Physicist, Medical Strain: Low Risk (09/09/2024)   Overall Financial Resource Strain (CARDIA)    Difficulty of Paying Living  Expenses: Not hard at all  Food Insecurity: Low Risk (10/24/2024)   Received from [facility]   Epic    Within the past 12 months, you worried that your food would run out before you got money to buy more: Never true    Within the past 12 months, the food you bought just didn't last and you didn't have money to get more. : Never true  Transportation Needs: No Transportation Needs (10/24/2024)   Received from [facility]    In the past 12 months, has lack of reliable transportation kept you from medical  appointments, meetings, work or from getting things needed for daily living? : No  Physical Activity: Sufficiently Active (09/09/2024)   Exercise Vital Sign    Days of Exercise per Week: 7 days    Minutes of Exercise per Session: 60 min  Stress: No Stress Concern Present (09/09/2024)   [employer] of Occupational Health - Occupational Stress Questionnaire    Feeling of Stress: Not at all  Social Connections: Socially Integrated (09/09/2024)   Social Connection and Isolation Panel    Frequency of Communication with Friends and Family: More than three times a week    Frequency of Social Gatherings with Friends and Family: Once a week    Attends Religious Services: More than 4 times per year    Active Member of [employer] or Organizations: Yes    Attends Banker Meetings: More than 4 times per year    Marital Status: Living with partner  Depression (PHQ2-9): Low Risk (09/09/2024)   Depression (PHQ2-9)    PHQ-2 Score: 0  Alcohol Screen: Low Risk (09/09/2024)   Alcohol Screen    Last Alcohol Screening Score (AUDIT): 0  Housing: Low Risk (10/24/2024)   Received from [facility]   Epic    What is your living situation today?: I have a steady place to live    Think about the place you live. Do you have problems with any of the following? Choose all that apply:: None/None on this list  Utilities: Low Risk (10/24/2024)   Received from [facility]    Utilities    In the past 12 months has the electric, gas, oil, or water company threatened to shut off services in your home? : No  Health Literacy: Adequate Health Literacy (09/09/2024)   B1300 Health Literacy    Frequency of need for help with medical instructions: Never    MEDICATIONS:  Current Outpatient Medications  Medication Sig Dispense Refill   Accu-Chek Softclix Lancets lancets Use to check blood glucose levels before meals and at bedtime 100 each 12   acetaminophen  (TYLENOL ) 500 MG tablet Take 500 mg by mouth every 6 (six) hours as needed.     Blood Glucose Monitoring Suppl (ACCU-CHEK GUIDE) w/Device KIT USE TO CHECK BLOOD GLUCOSE LEVELS BEFORE MEALS AND AT BEDTIME 1 kit 0   Blood Pressure Monitor DEVI Use as directed to check home blood pressure at least 4 times a week. Please notify primary provider of blood pressures greater than 140/90. Please send prescription to [facility].  Phone Number [phone] Fax Number [phone] 1 each 0   Cholecalciferol (VITAMIN D-1000 MAX ST) 25 MCG (1000 UT) tablet Take 1,000 Units by mouth daily.     Continuous Glucose Receiver (DEXCOM G7 RECEIVER) DEVI 1 Device by Does not apply route continuous. Use with Dexcom G7 sensors to monitor blood glucose     Continuous Glucose Receiver (DEXCOM G7 RECEIVER) DEVI USE TO CHECK BLOOD SUGAR DAILY 1 each 0   Continuous Glucose Sensor (DEXCOM G7 SENSOR) MISC Change every 10 days 9 each 2   Continuous Glucose Sensor (DEXCOM G7 SENSOR) MISC Use to check glucose continuously, change sensor every 10 days     cyanocobalamin 100 MCG tablet Take 1 tablet by mouth daily.     cycloSPORINE (RESTASIS) 0.05 % ophthalmic emulsion Place 1 drop into both eyes 2 (two) times daily.     Docusate Calcium  (STOOL SOFTENER PO) Take by mouth.     gabapentin  (NEURONTIN ) 300 MG capsule TAKE 1 CAPSULE BY MOUTH EVERYDAY AT  BEDTIME 90 capsule 0   Glucagon  (GVOKE HYPOPEN  1-PACK) 1 MG/0.2ML SOAJ Inject 1 mg into the skin as  needed (low blood sugar with impaired consciousness). 0.4 mL 2   glucose blood (ACCU-CHEK GUIDE TEST) test strip Use to test blood glucose before meals and at bedtime 300 each 12   insulin  glargine (LANTUS  SOLOSTAR) 100 UNIT/ML Solostar Pen INJECT 7 UNITS INTO THE SKIN AT BEDTIME. 15 mL 4   insulin  lispro (HUMALOG  KWIKPEN) 100 UNIT/ML KwikPen NOVOLOG  2-4 UNITS WITH MEALS 3 TIMES A DAY, MAXIMUM 12 UNITS/DAY. 15 mL 11   Insulin  Pen Needle (B-D UF III MINI PEN NEEDLES) 31G X 5 MM MISC USE 3 TIMES DAILY 300 each 2   latanoprost (XALATAN) 0.005 % ophthalmic solution SMARTSIG:1 Drop(s) In Eye(s) Every Evening     losartan -hydrochlorothiazide (HYZAAR) 100-25 MG tablet TAKE 1 TABLET BY MOUTH EVERY DAY 90 tablet 0   losartan -hydrochlorothiazide (HYZAAR) 100-25 MG tablet TAKE 1 TABLET BY MOUTH EVERY DAY 90 tablet 0   Multiple Vitamin (MULTIVITAMIN WITH MINERALS) TABS tablet Take 1 tablet by mouth daily.     sildenafil  (VIAGRA ) 100 MG tablet Take 100 mg by mouth.     tadalafil (CIALIS) 5 MG tablet Take 5 mg by mouth daily.     simvastatin  (ZOCOR ) 20 MG tablet Take 1 tablet (20 mg total) by mouth daily. 90 tablet 3   No current facility-administered medications for this visit.    PHYSICAL EXAM: Vitals:   12/19/24 0822  Weight: 161 lb (73 kg)  Height: 5' 11 (1.803 m)       Body mass index is 22.45 kg/m.  Wt Readings from Last 3 Encounters:  12/19/24 161 lb (73 kg)  09/09/24 159 lb (72.1 kg)  08/19/24 159 lb 9.6 oz (72.4 kg)    General: Well developed, well nourished male in no apparent distress.  HEENT: AT/[place], no external lesions.  Eyes: Conjunctiva clear and no icterus. Neck: Neck supple  Neurologic: Alert, oriented, normal speech Psychiatric: Does not appear depressed or anxious  Diabetic Foot Exam - Simple   No data filed     LABS Reviewed Lab Results  Component Value Date   HGBA1C 7.4 (A) 08/19/2024   HGBA1C 7.1 (A) 04/18/2024   HGBA1C 7.3 (A) 01/17/2024   No results found  for: FRUCTOSAMINE Lab Results  Component Value Date   CHOL 138 08/19/2024   HDL 39 (L) 08/19/2024   LDLCALC 78 08/19/2024   LDLDIRECT 113.0 06/17/2022   TRIG 131 08/19/2024   CHOLHDL 3.5 08/19/2024   Lab Results  Component Value Date   MICRALBCREAT 10 08/19/2024   MICRALBCREAT 17 04/07/2021   Lab Results  Component Value Date   CREATININE 1.29 08/19/2024   Lab Results  Component Value Date   GFR 56.82 (L) 07/13/2023    Latest Reference Range & Units 07/17/23 09:39  IA-2 Antibody <5.4 U/mL <5.4  ZNT8 Antibodies <15 U/mL <10  Glutamic Acid Decarb Ab <5 IU/mL <5  C-Peptide 0.80 - 3.85 ng/mL 0.84    ASSESSMENT / PLAN  1. Type 1 diabetes mellitus with diabetic polyneuropathy (HCC)   2. Uncontrolled type 1 diabetes mellitus with hyperglycemia (HCC)   3. Mixed hyperlipidemia    Diabetes Mellitus type 1, complicated by peripheral neuropathy and CKD - Diabetic status / severity: Uncontrolled.  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    Not able to check A1c due to being video visit.  -MEDICATIONS: No change. I) continue Lantus  7 units daily. II) Adjust NovoLog  2-4 units with meals 3 times a day.  Okay to adjust NovoLog , take less during increased physical activity and eating less.  He is generally taking 2 units for lunch and supper asked to take up to 4 unit and adjust in between 2 to 4 units to based on meal size and carbohydrate content.  Again discussed in detail today.  Discussed about limiting portion and limiting carbohydrate content in the meal.  Advised to avoid snacks.  Patient is advised to bring Dexcom receiver to download and review glucose data if possible and if he is concerned about blood sugar.  - Home glucose testing: DEXCOM G7 / check as needed.   - Discussed/ Gave Hypoglycemia  treatment plan. Glucagon  emergency kit / Gvoke ordered. Use glucose tablet as needed.   # Consult : not required at this time.   # Annual urine for microalbuminuria/ creatinine ratio, no microalbuminuria currently, continue ACE/ARB / losartan .  Will check today. Last  Lab Results  Component Value Date   MICRALBCREAT 10 08/19/2024    # Foot check nightly / neuropathy, continue gabapentin .   # Annual dilated diabetic eye exams. ?  Diabetic retinopathy.  - Diet: Eat reasonable portion sizes to promote a healthy weight - Life style / activity / exercise: discussed.   2. Blood pressure  -  BP Readings from Last 1 Encounters:  08/19/24 132/82    - Control is in target.  - No change in current plans.  3. Lipid status / Hyperlipidemia - Last  Lab Results  Component Value Date   LDLCALC 78 08/19/2024   - Continue simvastatin  20 mg daily.    Patrick [name] was seen today for diabetes.  Diagnoses and all orders for this visit:  Type 1 diabetes mellitus with diabetic polyneuropathy (HCC)  Uncontrolled type 1 diabetes mellitus with hyperglycemia (HCC)  Mixed hyperlipidemia -     simvastatin  (ZOCOR ) 20 MG tablet; Take 1 tablet (20 mg total) by mouth daily.    DISPOSITION Follow up in clinic in 3 months suggested.  Labs on the same day of the visit.  All questions answered and patient verbalized understanding of the plan.  Patrick Casale, MD [facility] [address] [place], [address] [place], [place] [address] Phone # [phone]  At least part of this note was generated using voice recognition software. Inadvertent word errors may have occurred, which were not recognized during the proofreading process. "

## 2024-12-24 ENCOUNTER — Other Ambulatory Visit: Payer: Self-pay | Admitting: Endocrinology

## 2024-12-24 DIAGNOSIS — E1042 Type 1 diabetes mellitus with diabetic polyneuropathy: Secondary | ICD-10-CM

## 2024-12-26 NOTE — Telephone Encounter (Signed)
 Attempted to reach patient concerning colonoscopy recall; unable to speak with patient; will attempt to reach patient at a later date/time as the patient does not have voicemail set up;

## 2025-04-02 ENCOUNTER — Ambulatory Visit: Admitting: Endocrinology
